# Patient Record
Sex: Female | Born: 1935 | Race: White | Hispanic: No | Marital: Married | Smoking: Never smoker
Health system: Southern US, Community
[De-identification: ages and names within clinical notes are randomized; demographics above are authoritative.]

## PROBLEM LIST (undated history)

## (undated) DIAGNOSIS — I4891 Unspecified atrial fibrillation: Secondary | ICD-10-CM

## (undated) DIAGNOSIS — I509 Heart failure, unspecified: Secondary | ICD-10-CM

## (undated) DIAGNOSIS — I350 Nonrheumatic aortic (valve) stenosis: Secondary | ICD-10-CM

## (undated) DIAGNOSIS — J45909 Unspecified asthma, uncomplicated: Secondary | ICD-10-CM

## (undated) DIAGNOSIS — G4730 Sleep apnea, unspecified: Secondary | ICD-10-CM

## (undated) DIAGNOSIS — D649 Anemia, unspecified: Secondary | ICD-10-CM

## (undated) DIAGNOSIS — I351 Nonrheumatic aortic (valve) insufficiency: Secondary | ICD-10-CM

## (undated) DIAGNOSIS — M1990 Unspecified osteoarthritis, unspecified site: Secondary | ICD-10-CM

## (undated) DIAGNOSIS — E1165 Type 2 diabetes mellitus with hyperglycemia: Secondary | ICD-10-CM

## (undated) DIAGNOSIS — M1A9XX0 Chronic gout, unspecified, without tophus (tophi): Secondary | ICD-10-CM

## (undated) DIAGNOSIS — E785 Hyperlipidemia, unspecified: Secondary | ICD-10-CM

## (undated) DIAGNOSIS — I10 Essential (primary) hypertension: Secondary | ICD-10-CM

## (undated) DIAGNOSIS — J449 Chronic obstructive pulmonary disease, unspecified: Secondary | ICD-10-CM

## (undated) DIAGNOSIS — IMO0002 Reserved for concepts with insufficient information to code with codable children: Secondary | ICD-10-CM

## (undated) DIAGNOSIS — M4302 Spondylolysis, cervical region: Secondary | ICD-10-CM

## (undated) DIAGNOSIS — M5030 Other cervical disc degeneration, unspecified cervical region: Secondary | ICD-10-CM

## (undated) DIAGNOSIS — I059 Rheumatic mitral valve disease, unspecified: Secondary | ICD-10-CM

## (undated) DIAGNOSIS — Z86718 Personal history of other venous thrombosis and embolism: Secondary | ICD-10-CM

## (undated) DIAGNOSIS — G2581 Restless legs syndrome: Secondary | ICD-10-CM

## (undated) DIAGNOSIS — F419 Anxiety disorder, unspecified: Secondary | ICD-10-CM

## (undated) HISTORY — PX: ABDOMINAL HYSTERECTOMY: SHX81

## (undated) HISTORY — DX: Unspecified osteoarthritis, unspecified site: M19.90

## (undated) HISTORY — DX: Unspecified asthma, uncomplicated: J45.909

## (undated) HISTORY — DX: Personal history of other venous thrombosis and embolism: Z86.718

## (undated) HISTORY — PX: CARDIAC CATHETERIZATION: SHX172

## (undated) HISTORY — DX: Heart failure, unspecified: I50.9

## (undated) HISTORY — DX: Essential (primary) hypertension: I10

## (undated) HISTORY — DX: Unspecified atrial fibrillation: I48.91

## (undated) HISTORY — DX: Hyperlipidemia, unspecified: E78.5

## (undated) HISTORY — DX: Nonrheumatic aortic (valve) stenosis: I35.0

## (undated) HISTORY — DX: Sleep apnea, unspecified: G47.30

## (undated) HISTORY — DX: Nonrheumatic aortic (valve) insufficiency: I35.1

## (undated) HISTORY — DX: Type 2 diabetes mellitus with hyperglycemia: E11.65

## (undated) HISTORY — PX: COLONOSCOPY: SHX174

## (undated) HISTORY — DX: Reserved for concepts with insufficient information to code with codable children: IMO0002

---

## 2004-02-03 ENCOUNTER — Ambulatory Visit: Payer: Self-pay

## 2004-04-28 ENCOUNTER — Ambulatory Visit: Payer: Self-pay | Admitting: Unknown Physician Specialty

## 2005-05-02 ENCOUNTER — Inpatient Hospital Stay: Payer: Self-pay | Admitting: Internal Medicine

## 2005-07-06 ENCOUNTER — Ambulatory Visit: Payer: Self-pay | Admitting: Unknown Physician Specialty

## 2005-11-10 ENCOUNTER — Ambulatory Visit: Payer: Self-pay | Admitting: Unknown Physician Specialty

## 2007-02-01 ENCOUNTER — Ambulatory Visit: Payer: Self-pay | Admitting: Unknown Physician Specialty

## 2007-09-11 ENCOUNTER — Ambulatory Visit: Payer: Self-pay | Admitting: Unknown Physician Specialty

## 2008-02-04 ENCOUNTER — Ambulatory Visit: Payer: Self-pay | Admitting: Unknown Physician Specialty

## 2008-09-09 ENCOUNTER — Ambulatory Visit: Payer: Self-pay | Admitting: Unknown Physician Specialty

## 2008-09-16 ENCOUNTER — Ambulatory Visit: Payer: Self-pay | Admitting: Unknown Physician Specialty

## 2008-10-22 ENCOUNTER — Ambulatory Visit: Payer: Self-pay | Admitting: Surgery

## 2009-02-04 ENCOUNTER — Ambulatory Visit: Payer: Self-pay | Admitting: Unknown Physician Specialty

## 2009-02-05 ENCOUNTER — Ambulatory Visit: Payer: Self-pay | Admitting: Unknown Physician Specialty

## 2010-02-08 ENCOUNTER — Ambulatory Visit: Payer: Self-pay | Admitting: Unknown Physician Specialty

## 2011-05-10 ENCOUNTER — Ambulatory Visit: Payer: Self-pay | Admitting: Unknown Physician Specialty

## 2011-11-18 ENCOUNTER — Ambulatory Visit: Payer: Self-pay | Admitting: Unknown Physician Specialty

## 2011-12-11 ENCOUNTER — Ambulatory Visit: Payer: Self-pay | Admitting: Internal Medicine

## 2012-01-06 ENCOUNTER — Inpatient Hospital Stay: Payer: Self-pay | Admitting: Internal Medicine

## 2012-01-06 LAB — COMPREHENSIVE METABOLIC PANEL
Albumin: 3.7 g/dL (ref 3.4–5.0)
Anion Gap: 7 (ref 7–16)
BUN: 80 mg/dL — ABNORMAL HIGH (ref 7–18)
Bilirubin,Total: 0.5 mg/dL (ref 0.2–1.0)
Chloride: 104 mmol/L (ref 98–107)
Co2: 28 mmol/L (ref 21–32)
Creatinine: 2.05 mg/dL — ABNORMAL HIGH (ref 0.60–1.30)
EGFR (African American): 27 — ABNORMAL LOW
Glucose: 134 mg/dL — ABNORMAL HIGH (ref 65–99)
Osmolality: 304 (ref 275–301)
Potassium: 4.7 mmol/L (ref 3.5–5.1)
SGPT (ALT): 20 U/L (ref 12–78)
Total Protein: 7.5 g/dL (ref 6.4–8.2)

## 2012-01-06 LAB — PROTIME-INR
INR: 1.8
Prothrombin Time: 20.9 secs — ABNORMAL HIGH (ref 11.5–14.7)

## 2012-01-06 LAB — PRO B NATRIURETIC PEPTIDE: B-Type Natriuretic Peptide: 17254 pg/mL — ABNORMAL HIGH (ref 0–450)

## 2012-01-06 LAB — CBC WITH DIFFERENTIAL/PLATELET
Basophil %: 0.6 %
Eosinophil #: 0.2 10*3/uL (ref 0.0–0.7)
Eosinophil %: 2.2 %
HCT: 35.3 % (ref 35.0–47.0)
Lymphocyte #: 1 10*3/uL (ref 1.0–3.6)
MCHC: 31.7 g/dL — ABNORMAL LOW (ref 32.0–36.0)
MCV: 93 fL (ref 80–100)
Neutrophil #: 5.9 10*3/uL (ref 1.4–6.5)
RDW: 15.4 % — ABNORMAL HIGH (ref 11.5–14.5)

## 2012-01-06 LAB — DIGOXIN LEVEL: Digoxin: >5 ng/mL

## 2012-01-06 LAB — URINALYSIS, COMPLETE
Bacteria: NONE SEEN
Bilirubin,UR: NEGATIVE
Hyaline Cast: 3
Ketone: NEGATIVE
Nitrite: NEGATIVE
Ph: 5 (ref 4.5–8.0)
Specific Gravity: 1.011 (ref 1.003–1.030)
Squamous Epithelial: NONE SEEN

## 2012-01-06 LAB — CK TOTAL AND CKMB (NOT AT ARMC): CK-MB: 1.2 ng/mL (ref 0.5–3.6)

## 2012-01-06 LAB — APTT: Activated PTT: 69.6 secs — ABNORMAL HIGH (ref 23.6–35.9)

## 2012-01-07 LAB — BASIC METABOLIC PANEL
BUN: 74 mg/dL — ABNORMAL HIGH (ref 7–18)
Chloride: 107 mmol/L (ref 98–107)
EGFR (African American): 33 — ABNORMAL LOW
Osmolality: 304 (ref 275–301)
Potassium: 4.2 mmol/L (ref 3.5–5.1)
Sodium: 141 mmol/L (ref 136–145)

## 2012-01-07 LAB — TROPONIN I
Troponin-I: 0.06 ng/mL — ABNORMAL HIGH
Troponin-I: 0.05 ng/mL

## 2012-01-07 LAB — DIGOXIN LEVEL: Digoxin: 0.23 ng/mL

## 2012-01-08 DIAGNOSIS — I509 Heart failure, unspecified: Secondary | ICD-10-CM

## 2012-01-08 LAB — BASIC METABOLIC PANEL
Anion Gap: 3 — ABNORMAL LOW (ref 7–16)
BUN: 60 mg/dL — ABNORMAL HIGH (ref 7–18)
Calcium, Total: 8.8 mg/dL (ref 8.5–10.1)
Chloride: 109 mmol/L — ABNORMAL HIGH (ref 98–107)
Co2: 31 mmol/L (ref 21–32)
EGFR (African American): 39 — ABNORMAL LOW
EGFR (Non-African Amer.): 33 — ABNORMAL LOW
Glucose: 152 mg/dL — ABNORMAL HIGH (ref 65–99)
Osmolality: 305 (ref 275–301)
Potassium: 4.8 mmol/L (ref 3.5–5.1)

## 2012-01-08 LAB — URINE CULTURE

## 2012-01-09 LAB — COMPREHENSIVE METABOLIC PANEL
BUN: 73 mg/dL — ABNORMAL HIGH (ref 7–18)
Bilirubin,Total: 0.3 mg/dL (ref 0.2–1.0)
Calcium, Total: 8.6 mg/dL (ref 8.5–10.1)
Chloride: 106 mmol/L (ref 98–107)
EGFR (African American): 29 — ABNORMAL LOW
EGFR (Non-African Amer.): 25 — ABNORMAL LOW
Potassium: 5.4 mmol/L — ABNORMAL HIGH (ref 3.5–5.1)
SGOT(AST): 35 U/L (ref 15–37)
Sodium: 141 mmol/L (ref 136–145)
Total Protein: 6.3 g/dL — ABNORMAL LOW (ref 6.4–8.2)

## 2012-01-09 LAB — CBC WITH DIFFERENTIAL/PLATELET
Basophil #: 0 10*3/uL (ref 0.0–0.1)
Basophil %: 0.1 %
Eosinophil #: 0 10*3/uL (ref 0.0–0.7)
Eosinophil %: 0 %
HCT: 31.5 % — ABNORMAL LOW (ref 35.0–47.0)
Lymphocyte #: 0.3 10*3/uL — ABNORMAL LOW (ref 1.0–3.6)
Lymphocyte %: 6.4 %
MCH: 30 pg (ref 26.0–34.0)
MCV: 94 fL (ref 80–100)
Monocyte #: 0.1 x10 3/mm — ABNORMAL LOW (ref 0.2–0.9)
Monocyte %: 1.4 %
Neutrophil #: 4.1 10*3/uL (ref 1.4–6.5)
Platelet: 97 10*3/uL — ABNORMAL LOW (ref 150–440)
RBC: 3.34 10*6/uL — ABNORMAL LOW (ref 3.80–5.20)
WBC: 4.5 10*3/uL (ref 3.6–11.0)

## 2012-01-09 LAB — POTASSIUM: Potassium: 5 mmol/L (ref 3.5–5.1)

## 2012-01-10 LAB — BASIC METABOLIC PANEL
Anion Gap: 6 — ABNORMAL LOW (ref 7–16)
BUN: 87 mg/dL — ABNORMAL HIGH (ref 7–18)
Calcium, Total: 8.2 mg/dL — ABNORMAL LOW (ref 8.5–10.1)
Chloride: 102 mmol/L (ref 98–107)
Co2: 30 mmol/L (ref 21–32)
EGFR (Non-African Amer.): 20 — ABNORMAL LOW
Osmolality: 311 (ref 275–301)
Potassium: 4.4 mmol/L (ref 3.5–5.1)

## 2012-01-10 LAB — CBC WITH DIFFERENTIAL/PLATELET
Basophil #: 0 10*3/uL (ref 0.0–0.1)
Basophil %: 0 %
Eosinophil #: 0 10*3/uL (ref 0.0–0.7)
HCT: 30.7 % — ABNORMAL LOW (ref 35.0–47.0)
Lymphocyte #: 0.3 10*3/uL — ABNORMAL LOW (ref 1.0–3.6)
MCH: 30.4 pg (ref 26.0–34.0)
MCV: 92 fL (ref 80–100)
Monocyte #: 0.2 x10 3/mm (ref 0.2–0.9)
Platelet: 102 10*3/uL — ABNORMAL LOW (ref 150–440)
RBC: 3.33 10*6/uL — ABNORMAL LOW (ref 3.80–5.20)
RDW: 15.3 % — ABNORMAL HIGH (ref 11.5–14.5)
WBC: 9 10*3/uL (ref 3.6–11.0)

## 2012-01-11 LAB — BASIC METABOLIC PANEL
Anion Gap: 4 — ABNORMAL LOW (ref 7–16)
Calcium, Total: 8.9 mg/dL (ref 8.5–10.1)
Creatinine: 1.79 mg/dL — ABNORMAL HIGH (ref 0.60–1.30)
EGFR (African American): 31 — ABNORMAL LOW
EGFR (Non-African Amer.): 27 — ABNORMAL LOW
Glucose: 246 mg/dL — ABNORMAL HIGH (ref 65–99)
Osmolality: 315 (ref 275–301)
Potassium: 3.4 mmol/L — ABNORMAL LOW (ref 3.5–5.1)
Sodium: 142 mmol/L (ref 136–145)

## 2012-01-12 LAB — BASIC METABOLIC PANEL
Anion Gap: 6 — ABNORMAL LOW (ref 7–16)
BUN: 74 mg/dL — ABNORMAL HIGH (ref 7–18)
Calcium, Total: 9.3 mg/dL (ref 8.5–10.1)
Chloride: 104 mmol/L (ref 98–107)
Creatinine: 1.58 mg/dL — ABNORMAL HIGH (ref 0.60–1.30)
Glucose: 182 mg/dL — ABNORMAL HIGH (ref 65–99)
Osmolality: 317 (ref 275–301)
Potassium: 4.2 mmol/L (ref 3.5–5.1)

## 2012-01-13 LAB — BASIC METABOLIC PANEL
BUN: 65 mg/dL — ABNORMAL HIGH (ref 7–18)
Chloride: 101 mmol/L (ref 98–107)
Creatinine: 1.33 mg/dL — ABNORMAL HIGH (ref 0.60–1.30)
EGFR (Non-African Amer.): 39 — ABNORMAL LOW
Glucose: 189 mg/dL — ABNORMAL HIGH (ref 65–99)
Potassium: 4.5 mmol/L (ref 3.5–5.1)

## 2012-01-15 ENCOUNTER — Other Ambulatory Visit: Payer: Self-pay | Admitting: Family Medicine

## 2012-01-15 LAB — PROTIME-INR
INR: 1.3
Prothrombin Time: 16.3 secs — ABNORMAL HIGH (ref 11.5–14.7)

## 2012-02-08 DIAGNOSIS — K8020 Calculus of gallbladder without cholecystitis without obstruction: Secondary | ICD-10-CM | POA: Insufficient documentation

## 2012-02-08 DIAGNOSIS — I059 Rheumatic mitral valve disease, unspecified: Secondary | ICD-10-CM | POA: Insufficient documentation

## 2012-02-08 DIAGNOSIS — D649 Anemia, unspecified: Secondary | ICD-10-CM | POA: Insufficient documentation

## 2012-02-08 DIAGNOSIS — N3020 Other chronic cystitis without hematuria: Secondary | ICD-10-CM | POA: Insufficient documentation

## 2012-02-08 DIAGNOSIS — I359 Nonrheumatic aortic valve disorder, unspecified: Secondary | ICD-10-CM | POA: Insufficient documentation

## 2012-02-08 DIAGNOSIS — I10 Essential (primary) hypertension: Secondary | ICD-10-CM | POA: Insufficient documentation

## 2012-02-08 DIAGNOSIS — N200 Calculus of kidney: Secondary | ICD-10-CM | POA: Insufficient documentation

## 2012-02-08 DIAGNOSIS — R339 Retention of urine, unspecified: Secondary | ICD-10-CM | POA: Insufficient documentation

## 2012-02-08 DIAGNOSIS — E119 Type 2 diabetes mellitus without complications: Secondary | ICD-10-CM | POA: Insufficient documentation

## 2012-02-08 DIAGNOSIS — N3946 Mixed incontinence: Secondary | ICD-10-CM | POA: Insufficient documentation

## 2012-02-08 DIAGNOSIS — B350 Tinea barbae and tinea capitis: Secondary | ICD-10-CM | POA: Insufficient documentation

## 2012-04-03 ENCOUNTER — Ambulatory Visit: Payer: Self-pay | Admitting: Cardiology

## 2012-04-03 LAB — PROTIME-INR: INR: 1.1

## 2012-04-26 DIAGNOSIS — I509 Heart failure, unspecified: Secondary | ICD-10-CM | POA: Insufficient documentation

## 2012-06-12 DIAGNOSIS — Z952 Presence of prosthetic heart valve: Secondary | ICD-10-CM | POA: Insufficient documentation

## 2012-06-12 DIAGNOSIS — R0689 Other abnormalities of breathing: Secondary | ICD-10-CM | POA: Insufficient documentation

## 2012-09-19 ENCOUNTER — Ambulatory Visit: Payer: Self-pay | Admitting: Physician Assistant

## 2012-11-13 ENCOUNTER — Ambulatory Visit: Payer: Self-pay | Admitting: Physician Assistant

## 2012-11-20 DIAGNOSIS — N281 Cyst of kidney, acquired: Secondary | ICD-10-CM | POA: Insufficient documentation

## 2013-02-12 ENCOUNTER — Ambulatory Visit: Payer: Self-pay | Admitting: Physician Assistant

## 2013-03-31 ENCOUNTER — Emergency Department: Payer: Self-pay | Admitting: Internal Medicine

## 2013-03-31 LAB — CBC
HCT: 32.4 % — ABNORMAL LOW (ref 35.0–47.0)
HGB: 10.7 g/dL — ABNORMAL LOW (ref 12.0–16.0)
MCH: 30.8 pg (ref 26.0–34.0)
MCHC: 33 g/dL (ref 32.0–36.0)
MCV: 93 fL (ref 80–100)
Platelet: 133 10*3/uL — ABNORMAL LOW (ref 150–440)
RBC: 3.46 10*6/uL — ABNORMAL LOW (ref 3.80–5.20)
RDW: 15.6 % — ABNORMAL HIGH (ref 11.5–14.5)
WBC: 9.8 10*3/uL (ref 3.6–11.0)

## 2013-03-31 LAB — BASIC METABOLIC PANEL
Anion Gap: 2 — ABNORMAL LOW (ref 7–16)
BUN: 26 mg/dL — ABNORMAL HIGH (ref 7–18)
CREATININE: 1.62 mg/dL — AB (ref 0.60–1.30)
Calcium, Total: 9.4 mg/dL (ref 8.5–10.1)
Chloride: 101 mmol/L (ref 98–107)
Co2: 32 mmol/L (ref 21–32)
EGFR (African American): 35 — ABNORMAL LOW
EGFR (Non-African Amer.): 30 — ABNORMAL LOW
Glucose: 180 mg/dL — ABNORMAL HIGH (ref 65–99)
Osmolality: 279 (ref 275–301)
Potassium: 4 mmol/L (ref 3.5–5.1)
SODIUM: 135 mmol/L — AB (ref 136–145)

## 2013-03-31 LAB — TROPONIN I

## 2013-04-08 ENCOUNTER — Other Ambulatory Visit: Payer: Self-pay | Admitting: Rheumatology

## 2013-04-13 LAB — CULTURE, BLOOD (SINGLE)

## 2013-04-23 DIAGNOSIS — M1990 Unspecified osteoarthritis, unspecified site: Secondary | ICD-10-CM | POA: Insufficient documentation

## 2013-05-07 DIAGNOSIS — Z9889 Other specified postprocedural states: Secondary | ICD-10-CM | POA: Insufficient documentation

## 2013-05-23 DIAGNOSIS — G629 Polyneuropathy, unspecified: Secondary | ICD-10-CM | POA: Insufficient documentation

## 2013-06-12 ENCOUNTER — Ambulatory Visit: Payer: Self-pay | Admitting: Urology

## 2013-06-12 DIAGNOSIS — N23 Unspecified renal colic: Secondary | ICD-10-CM | POA: Insufficient documentation

## 2013-06-23 ENCOUNTER — Emergency Department: Payer: Self-pay | Admitting: Emergency Medicine

## 2013-06-28 DIAGNOSIS — M5030 Other cervical disc degeneration, unspecified cervical region: Secondary | ICD-10-CM | POA: Insufficient documentation

## 2013-06-28 DIAGNOSIS — M47812 Spondylosis without myelopathy or radiculopathy, cervical region: Secondary | ICD-10-CM | POA: Insufficient documentation

## 2013-06-28 DIAGNOSIS — M62838 Other muscle spasm: Secondary | ICD-10-CM | POA: Insufficient documentation

## 2013-07-15 DIAGNOSIS — M5481 Occipital neuralgia: Secondary | ICD-10-CM | POA: Insufficient documentation

## 2014-04-29 NOTE — H&P (Signed)
PATIENT NAME:  Mindy Cook, Mindy Cook MR#:  [id number] DATE OF BIRTH:  16-Jul-1933  DATE OF ADMISSION:  01/06/2012  PRIMARY CARE PHYSICIAN: Dr. Silver Huguenin.   CHIEF COMPLAINT: Weakness and dizziness.   HISTORY OF PRESENT ILLNESS: This is a 81 year old female who presents to the Emergency Room due to weakness and dizziness ongoing since the past 3 to 4 days. The patient says that she has felt lightheaded and dizzy when she wakes up when she walks around. She denies any chest pain. She denies any palpitations. She denies any true syncope. She went over to the [facility] today and they noted her heart rate be significantly low in the 30s, and she was sent over to the ER for further evaluation. The patient apparently has a history of chronic atrial fibrillation and had received a Holter monitor from Dr. Windell Norfolk last week. The results of the Holter just showed atrial fibrillation with some PVCs but no evidence of any acute bradycardia. Although when she presented to the Emergency Room, her heart rate was in 30s. She was also noted to have a significantly elevated digoxin level of greater than 5, and noted to be acute renal failure. Hospitalist services were contacted for further treatment and evaluation.   REVIEW OF SYSTEMS: CONSTITUTIONAL: No documented fever. Positive weakness. No weight gain or weight loss.  EYES: No blurred or double vision.  ENT: No tinnitus. No postnasal drip. No redness of the oropharynx.  RESPIRATORY: No cough, no wheeze, no hemoptysis, no dyspnea.  CARDIOVASCULAR: No chest pain, no orthopnea, no palpitations, no true syncope.  GASTROINTESTINAL: No nausea, no vomiting, no diarrhea, no abdominal pain, no melena or hematochezia.  GENITOURINARY: No dysuria, no hematuria.  ENDOCRINE: No polyuria or nocturia. No heat or cold intolerance.  HEMATOLOGICAL: No anemia. No bruising. No bleeding.  INTEGUMENTARY: No rashes. No lesions.  MUSCULOSKELETAL: No arthritis, no swelling, no gout.   NEUROLOGIC: No numbness, no tingling, no ataxia, no seizure-type activity.  PSYCH: No anxiety, no insomnia, no ADD.   PAST MEDICAL HISTORY: Consistent with chronic atrial fibrillation, hypertension, obstructive sleep apnea, carpal tunnel syndrome, hyperlipidemia, a postoperative deep vein thrombosis, shingles, varicose veins, aortic stenosis.   PAST MEDICAL HISTORY: Consistent with complete hysterectomy, tonsillectomy, appendectomy.   ALLERGIES: HYDROCODONE WHICH CAUSES HIVES.   SOCIAL HISTORY: No smoking. No alcohol abuse. No illicit drug abuse. Lives at home with her daughter.   FAMILY HISTORY: Father died from complications of a cancer on his back. Mother died from complications of heart disease.   CURRENT MEDICATIONS: Are as follows Cefuroxime 250 mg b.i.d., digoxin 0.25 mg daily, iron sulfate 325 mg daily, Lasix 20 mg daily, gabapentin 300 mg 2 caps t.i.d., hydrochlorothiazide/valsartan 12.5/160, 1 tab daily, meclizine 12.5 mg t.i.d. as needed, Oxybutynin 5 mg daily, Pradaxa 150 mg b.i.d. and Pravachol 40 mg daily.   PHYSICAL EXAMINATION ON ADMISSION: Are as follows VITAL SIGNS: Temperature is 97.6, pulse 60, respirations 23, blood pressure 120/54, saturations 97% on room air.  GENERAL: She is a pleasant appearing female in no apparent distress.  HEENT: Atraumatic, normocephalic. Extraocular muscles are intact. Pupils are equal and reactive to light. Sclerae anicteric. No conjunctival injection. No pharyngeal erythema.  NECK: Supple. No jugular venous distention. No bruits, no lymphadenopathy, no thyromegaly. HEART: Irregular, bradycardic. She does have a 2/6 diastolic murmur heard in the apex. No rubs, no clicks.  LUNGS: Clear to auscultation bilaterally. No rales, no rhonchi, no wheezes.  ABDOMEN: Soft, flat, nontender, nondistended. Has good bowel sounds. No hepatosplenomegaly  appreciated.  EXTREMITIES: No evidence of any cyanosis or clubbing. She does have +1 to 2 pitting edema from  the knees to the ankles bilaterally. She has skin changes consistent with peripheral vascular disease.  NEUROLOGIC: She is alert, awake, and oriented x 3 with no focal motor or sensory deficits bilaterally.  SKIN: Moist and warm with no rashes.  LYMPHATIC: There is no cervical or axillary lymphadenopathy.   LABORATORY, DIAGNOSTIC, AND RADIOLOGICAL DATA: Serum glucose of 134, BUN 80, creatinine 2.05, sodium 139, potassium 4.7, chloride 104, bicarb 28. LFTs within normal limits. Digoxin level greater than 5. White cell count 7.9, hemoglobin 11.2, hematocrit 35.3, platelet count 122, INR is 1.8. Urinalysis shows 2+ leukocyte esterase and 49 white cells.   ASSESSMENT AND PLAN: This is an 81 year old female with a history of hypertension, chronic atrial fibrillation, neuropathy, urinary incontinence, a history of recurrent urinary tract infections, hyperlipidemia, who presents to the hospital with dizziness and weakness and noted to be in acute renal failure. Also noted to be bradycardic secondary to dig toxicity.   PROBLEM LIST: 1. Acute renal failure. This is likely secondary to dehydration and poor p.o. intake along with concomitant use of diuretics. I will hold her hydrochlorothiazide and valsartan for now. I will also hold her Lasix and gently hydrate her with IV fluids. Follow up BUN, creatinine, and urine output. Renal dose medications, avoid nephrotoxins. 2. Bradycardia with digoxin toxicity. This is likely secondary to the acute renal failure leading to her digoxin toxicity leading to her bradycardia. She is currently symptomatic with her bradycardia. She is receiving one dose of Digibind now. I will repeat her levels in the morning. We will get a Cardiology consult. The patient is well known to Dr. Windell NorfolkParraschos. Hold digoxin for now. 3. Chronic atrial fibrillation. As mentioned the patient is currently bradycardic, therefore digoxin is on hold. I will continue her Pradaxa but renally dose it given her  renal failure for now. 4. A history of recurrent urinary tract infections. Her urinalysis is currently grossly abnormal therefore I will continue her Ceftin for now and follow up her urine cultures. 5. Hyperlipidemia. Continue Pravachol. 6. Neuropathy. Continue gabapentin. 7. Urinary incontinence. Continue with Oxybutrin.  CODE STATUS:  FULL CODE.   TIME SPENT ON ADMISSION: 50 minutes.  ____________________________ Rolly PancakeVivek J. Cherlynn KaiserSainani, MD vjs:jm D: 01/06/2012 21:52:47 ET T: 01/07/2012 11:03:44 ET JOB#: [id number]  cc: Rolly PancakeVivek J. Cherlynn KaiserSainani, MD, <Dictator> Houston SirenVIVEK J SAINANI MD ELECTRONICALLY SIGNED 01/09/2012 8:21

## 2014-04-29 NOTE — Consult Note (Signed)
    Comments   Had a lengthy family meeting with pt's husband, son, daughter, and daughter-in-law. Family updated. They all seem to understand that patient may do poorly long-term and it is unknown if patient will recover to her previous baseline. Note that she is making improvement and was able to walk today with PT although she continues to have exertional shortness of breath and hypoxia. Discussed options of STR vs home health vs home hospice. Family agrees with whatever patient wants including hospice if that is her choice. We also talked about code status extensively. Husband says that patient always has said "dont place me on machines". I spoke with pt with her family present. We again discussed disposition and pt initially only wanted to return home but she feels in her weakened state that this may be a burden to her family and is opting instead to pursue STR. Family aware that pt would most likely qualify for hospice care after rehab if still appropriate and referral could be made at that time.  with patient about code status. Pt confirms DNR and family agrees. Will change code status to reflect this.   Time: 45 minutes  Electronic Signatures: Karlea Mckibbin, Daryl EasternJoshua R (NP)  (Signed 31-Dec-13 15:50)  Authored: Palliative Care   Last Updated: 31-Dec-13 15:50 by Malachy MoanBorders, Oluwatoni Rotunno R (NP)

## 2014-05-02 NOTE — Discharge Summary (Signed)
PATIENT NAME:  Mindy Cook, Mindy Cook MR#:  [id number] DATE OF BIRTH:  [birth year]  DATE OF ADMISSION:  01/06/2012 DATE OF DISCHARGE:  01/13/2012  ADMITTING DIAGNOSIS: Congestive heart failure.  DISCHARGE DIAGNOSES: 1. Acute respiratory failure due to congestive heart failure, acute on chronic diastolic, valvular. Severe aortic stenosis, moderate tricuspid regurgitation and mild to moderate mitral regurgitation was noted on echocardiogram done on this admission.  2. Atrial fibrillation, slow ventricular response, due to digoxin toxicity, resolved.  3. Acute on chronic renal failure.  4. Questionable right-sided pneumonia. 5. Right upper quadrant abdominal pain of unclear etiology at this time. Ultrasound of right upper quadrant was unremarkable with just gallstones noted but no cholecystitis or obstruction was noted.  6. Ventricular tachycardia episodes, resolved.  7. History of chronic atrial fibrillation, on chronic anticoagulation, being loaded with Coumadin therapy. Pradaxa was stopped due to renal failure.  8. Generalized weakness.  9. Dyspnea due to congestive heart failure as well as aortic stenosis. 10. Deconditioning.  11. Gallstones. No cholecystitis or obstruction.  12. History of hypertension, hyperlipidemia, obstructive sleep apnea, deep venous thrombosis and varicose veins.   DISCHARGE CONDITION: Stable.   DISCHARGE MEDICATIONS: The patient is to continue the following. 1. Gabapentin 300 mg p.o. two capsules 3 times daily.  2. Pravastatin 40 mg p.o. at bedtime.  3. Oxybutynin 5 mg p.o. daily.  4. Meclizine 12.5 mg p.o. 3 times daily as needed.  5. Iron sulfate 325 mg p.o. once daily.  6. Prednisone 50 mg p.o. once on 01/14/2012 then taper by 10 mg daily until stopped.  7. Warfarin 5 mg p.o. daily for 3 days. Get pro time/INR in 2 days after discharge to check the patient's pro time/INR with loading warfarin doses.  8. Coreg 3.125 mg p.o. twice daily.  9. Hydrocortisone 25 mg  rectal suppository every 6 hours as needed.  10. Furosemide 40 mg p.o. daily.  11. Codeine with guaifenesin 10/100 mg in 5 mL oral syrup, 10 mL every 4 hours as needed for cough.   NOTE: The patient is not to take digoxin, hydrochlorothiazide/losartan, Pradaxa or cefuroxime unless recommended by primary care physician.   HOME OXYGEN: Portable tank at 2 liters of oxygen through nasal cannula.   DIET: 2 grams salt, low fat, low cholesterol, mechanical soft.   ACTIVITY LIMITATIONS: As tolerated.   REFERRALS: Physical therapy 2 to 7 times a week.    DISCHARGE FOLLOWUP: Appointment with Dr. Silver Huguenin in 2 days after discharge.   CONSULTANTS:  1. Marcina Millard, MD. 2. Care Management - Laurette Schimke, NP. 3. Ned Grace, MD. 4. Arnoldo Hooker, MD.   RADIOLOGIC STUDIES: Chest portable single view on 01/06/2012 showed congestive heart failure and possible right lower lobe pneumonia was also noted.   Chest portable single view on 01/07/2012 revealed persistent changes  with congestive heart failure. No improvement since December 27th. Right lower lobe pneumonia cannot be excluded, according to the radiologist.   Repeated chest x-ray, PA and lateral, on 01/08/2012, revealed increased consolidated density within the right hemithorax component which may represent pleural effusion and interstitial infiltrate likely representing pulmonary edema. Underlying infectious or inflammatory etiologies cannot be excluded. Surveillance evaluation was recommended.   Portable single view chest x-ray, on 01/09/2012, revealed interval decrease in bilateral heterogeneous opacities and a right pleural effusion was noted.  Portable single view chest x-ray, on 01/12/2012, showed continued slow improvement in decreasing bilateral pulmonary opacities. There is persistent severe cardiomegaly and pulmonary vascular prominence in the hilar regions. Possible trace  pleural effusions were noted.  Chest, PA and  lateral, on 01/13/2012, showed persistent cardiomegaly and pulmonary vascular congestion. Findings are concerning for congestive heart failure, according to the radiologist.   Ultrasound of abdomen, limited survey, on 01/13/2012, showed cholelithiasis. No findings of acute cholecystitis. No biliary ductal dilatation. Multiple small mobile stones are seen within the gallbladder.   HISTORY OF PRESENT ILLNESS: The patient is a 81 year old Caucasian female with past medical history significant for history of severe aortic stenosis who presented to the hospital with complaints of weakness as well as dizziness. Please refer to Dr. Becky Sax admission note on 01/06/2012.   On arrival to the hospital, the patient was noted to be bradycardic with heart rate in the 30s. She was in a-fib. Her vital signs, temperature was 97.6, pulse was 30s to 60s, respiration rate was 23, blood pressure 120/54 and saturation was 97% on oxygen therapy. The patient had a systolic murmur heard at aortic auscultation site as well as all precordium.   The patient's digoxin level was more than 5. Otherwise, the patient's data revealed elevated B-type natriuretic peptide of 17,254. Glucose was 134 and BUN and creatinine were 80 and 2.05, otherwise BMP was unremarkable. Magnesium level was elevated at 3.2. Liver enzymes were normal. Cardiac enzymes, first set, revealed elevated troponin of 0.06, the second set 0.05 and third set 0.09. Digoxin level, as mentioned above, was more than 5 initially. White blood cell count was normal at 7.9, hemoglobin was 11.2 and platelet count 122. Absolute neutrophil count was within normal limits.  Pro time was elevated at 20.9, INR was 1.8 and activated PTT was 69.6. Blood cultures x 2 did not show any growth. Urine cultures also showed no growth. Urinalysis revealed pyuria with 49 white blood cells, 4 red blood cells and 2+ leukocyte esterase. EKG showed undetermined rhythm initially at 65 beats per  minute, right bundle branch block, inferior infarct which was already decided before December 2013, as well as possible anterolateral infarct, which was also old. Nonspecific ST-T changes were noted. The patient's chest x-ray was concerning for congestive heart failure as well as possible pneumonia.   HOSPITAL COURSE: The patient was admitted to the hospital. She was started on diuretics as well as initially on antibiotics. With this conservative therapy, she improved. She was consulted by cardiologist and followed by palliative care. The patient did progressively better with diuretics and her oxygenation improved. On the day of discharge, 01/13/2012, the patient's vital signs: Temperature 98.4, pulse was 64, respiration rate 20, blood pressure 134/69 and saturation was 93% to 96% on 2 liters of oxygen through nasal cannula.   In regards to acute respiratory failure, it was felt the patient's acute respiratory failure was very likely related to CHF, left heart, acute on chronic diastolic. The patient had an echocardiogram done while in the hospital, on 01/09/2012. It revealed normal left ventricular function, LVH and severe aortic stenosis as well as moderate TR and mild to moderate MR. The patient was diuresed and she is being discharged on oral Lasix. She is however recommended to have her medications advanced if her weight is going up.   In regards to atrial fibrillation as well as rapid ventricular response, initially the patient's rate limiting medications such as digoxin or any other medications were placed on hold. Later Coreg was restarted. The patient was given Digibind for digoxin toxicity and the patient's digoxin level normalized as well as her heart rate. On the day of discharge, the patient's heart  rate is ranging between 60s to 70s.  In regards to acute on chronic renal failure, the patient's kidney function improved with conservative management. Initially, as mentioned above, on the day of  admission, the patient's creatinine was 2.05. However, on the day of discharge, the patient's creatinine is 1.33. It is recommended to follow the patient's creatinine levels and make decisions about advancement in diuretic medications if needed, especially in view of chronic renal insufficiency.   In regards to questionable right-sided pneumonia, the patient received antibiotics while she was in the hospital. She completed her antibiotic course. Her most recent x-rays were not consistent with any pneumonia so no antibiotics were given to her upon discharge. Her white blood cell count also remained stable and she had no left shift and had no fevers while in the hospital.   Regarding right upper quadrant abdominal pain, it was unclear why the patient was having pain, however, it was felt that the patient's right upper quadrant abdominal pain probably was cough related. She had x-ray repeated which did not show any significant changes however some pleural effusions were noted which could have contributed to right upper quadrant abdominal pain. She also had ultrasound of her abdomen done which showed no obstruction or any other abnormalities, except of gallstones. However, no infection was noted. It is recommended to give the patient pain medications as needed for right upper quadrant, right lower chest area pains, which are likely pleuritic pains.  In regards to v-tach, the patient had a few episodes of 5 to 6 beats of v-tach, however, those resolved with reinstatement of Coreg.  In regards to chronic atrial fibrillation, as mentioned above, initially the patient was severely bradycardic with heart rate in the 30s. However, with holding her digoxin, the patient's heart rate rebound.  On the day of discharge, the patient's heart rate, even on low Coreg doses, is stable in 60s to 70s.   The patient's Pradaxa was placed on hold due to renal insufficiency and Coumadin was initiated. The patient Coumadin is being  advanced, however, the patient's INR is very low at 1.2, on the day of discharge, 01/13/2012. It is recommended to follow the patient's pro time/INR and make decisions about advancement in her Coumadin dose if needed to keep a target INR level of 2.0 and above.  In regards to generalized weakness, the patient received physical therapy and she was also evaluated by palliative care. However, because she continued to improve somewhat, the decision was made to discharge her to skilled nursing facility for now. The patient would benefit however from palliative care and even hospice care at home.   In regards to hyperlipidemia, the patient is to continue her outpatient medications.   For hypertension, as mentioned above, the patient is on Coreg now. It is recommended to reinstate her ACE inhibitor or ARB. Her blood pressure is well controlled at this time. The patient is being discharged in stable condition with the above-mentioned medications and follow-up.   TIME SPENT: 40 minutes.  ____________________________ Katharina Caperima Sameul Tagle, MD rv:sb D: 01/13/2012 15:52:23 ET    T: 01/13/2012 16:39:45 ET       JOB#: [id number] cc: Katharina Caperima Alejandra Barna, MD, <Dictator> Yetta FlockAileen H. Miller, MD Giordano Getman MD ELECTRONICALLY SIGNED 01/22/2012 14:17

## 2014-05-02 NOTE — Discharge Summary (Signed)
PATIENT NAME:  Mindy Cook, [name] MR#:  [id number] DATE OF BIRTH:  1933-04-15  DATE OF ADMISSION:  01/06/2012 DATE OF DISCHARGE:  01/13/2012  ADDENDUM:  The patient's medication list is being updated and Lasix will be increased to 40 mg p.o. twice daily dose instead of once daily dose. However, it is recommended to advance the patient's Lasix dose depending on her ins and outs as well as her weight. It is also recommended to resume her HCTZ/losartan dose as outpatient. We are not restarting those medications at this time because of mild renal insufficiency and we think that she may not do well and that her kidney function can worsen and fluid retention may worsen with losartan, unfortunately. However, this needs to be readdressed in the future.  ____________________________ Katharina Caperima Oluwatobiloba Martin, MD rv:sb D: 01/13/2012 15:58:35 ET T: 01/13/2012 16:07:33 ET JOB#: [id number]  cc: Katharina Caperima Rikayla Demmon, MD, <Dictator> Tatym Schermer MD ELECTRONICALLY SIGNED 01/22/2012 14:17

## 2014-05-02 NOTE — Consult Note (Signed)
PATIENT NAME:  Mindy Cook, [name] MR#:  [id number] DATE OF BIRTH:  1933-12-09  DATE OF CONSULTATION:  01/08/2012  REFERRING PHYSICIAN:  Dr. Cherlynn KaiserSainani.   CONSULTING PHYSICIAN:  Lamar BlinksBruce J. Kowalski, MD  REASON FOR CONSULTATION: Atrial fibrillation with sick sinus syndrome, hypertension, chronic obstructive pulmonary disease, aortic stenosis with bradycardia and digoxin toxicity.   CHIEF COMPLAINT: "I am weak."   HISTORY OF PRESENT ILLNESS: This is a 81 year old female with atrial fibrillation with sick sinus syndrome who has been on digoxin and other medications. Hypertension control has been quite good. She does have some mild COPD which is not exacerbated at this time. The patient does have aortic valve disease with murmur but no evidence of significant aortic stenosis. With admission to the hospital, the patient had some shortness of breath, weakness and inability to do the things she wishes. EKG had shown atrial fibrillation with preventricular contractions and significant bradycardia. It was found that she had dig toxicity and therefore was given Digibind. The patient also had chronic kidney disease with exacerbation of chronic kidney disease, which is likely causing some of the issues above. The patient has had reasonable blood pressure and heart rate control, not requiring further intervention at this time.   REVIEW OF SYSTEMS: The remainder review of systems negative for vision change, ringing in the ears, hearing loss, cough, congestion, heartburn, nausea, vomiting, diarrhea, bloody stools, stomach pain, extremity pain, leg weakness, cramping of the buttocks, known blood clots, headaches, blackouts, dizzy spells, nosebleeds, congestion, trouble swallowing, frequent urination, urination at night, muscle weakness, numbness, anxiety or depression, skin lesions or skin rashes.   PAST MEDICAL HISTORY: 1.  Hypertension.  2.  Hyperlipidemia.  3.  COPD.  4.  Aortic stenosis.   FAMILY HISTORY: Mother had  heart failure.   SOCIAL HISTORY: Currently denies alcohol or tobacco use.   ALLERGIES: No known drug allergies.   CURRENT MEDICATIONS: As listed.   PHYSICAL EXAMINATION: VITAL SIGNS: Blood pressure is 132/68 bilaterally, heart rate is 48 upright reclining and irregular.  GENERAL: She is a well-appearing elderly female in no acute distress.  HEENT: No icterus, thyromegaly, ulcers, hemorrhage or xanthelasma.  CARDIOVASCULAR: Irregularly irregular with normal S1, S2 with a 3 out of 6 right upper sternal border murmur nonradiating. PMI is diffuse. Carotid upstroke normal with murmur radiation. Jugular venous pressure is normal.  LUNGS: Have a few basilar crackles with normal respirations and few expiratory wheezes.  ABDOMEN: Soft, nontender, without hepatosplenomegaly or masses. Abdominal aorta is normal size without bruit.  EXTREMITIES: Show 2+ radial, femoral, dorsal pedal pulses with no lower extremity edema, cyanosis, clubbing or ulcers.  NEUROLOGIC: She is oriented to time, place and person with normal mood and affect.   ASSESSMENT: This is a 81 year old female with atrial fibrillation, hypertension, chronic obstructive pulmonary disease, aortic valve stenosis with significant bradycardia, weakness, fatigue, chronic kidney disease and digoxin toxicity needing further treatment options.   RECOMMENDATIONS: 1.  Digibind for treatment of dig toxicity and watch heart rate improve.  2.  Avoid beta blockers.  3.  Continue anticoagulation for further risk reduction and stroke with atrial fibrillation.  4.  Hypertension control with calcium channel blocker. Would avoid ACE inhibitor due to  chronic kidney disease.  5.  Echocardiogram for LV systolic dysfunction, valvular heart disease causing above.  6.  Ambulate and follow for improvements of symptoms and possible discharge to home when able.     ____________________________ Lamar BlinksBruce J. Kowalski, MD bjk:cs D: 01/08/2012 07:46:00  ET T: 01/08/2012  19:52:28 ET JOB#: [id number]  cc: Lamar Blinks, MD, <Dictator> Lamar Blinks MD ELECTRONICALLY SIGNED 01/26/2012 8:26

## 2014-05-02 NOTE — Discharge Summary (Signed)
PATIENT NAME:  Mindy Cook, Mindy Cook MR#:  [id number] DATE OF BIRTH:  05/10/1933  DATE OF ADMISSION:  01/06/2012 DATE OF DISCHARGE:  01/13/2012  DICTATION ENDS HERE   ____________________________ Katharina Caperima Akasia Ahmad, MD rv:es D: 01/16/2012 20:07:22 ET T: 01/17/2012 11:17:31 ET JOB#: [id number]  cc: Katharina Caperima Shriyan Arakawa, MD, <Dictator> Jeidy Hoerner MD ELECTRONICALLY SIGNED 01/22/2012 14:17

## 2014-05-20 ENCOUNTER — Other Ambulatory Visit: Payer: Self-pay | Admitting: Orthopedic Surgery

## 2014-05-20 DIAGNOSIS — M75102 Unspecified rotator cuff tear or rupture of left shoulder, not specified as traumatic: Secondary | ICD-10-CM

## 2014-05-26 ENCOUNTER — Ambulatory Visit: Payer: Self-pay

## 2014-06-03 ENCOUNTER — Ambulatory Visit
Admission: RE | Admit: 2014-06-03 | Discharge: 2014-06-03 | Disposition: A | Discharge status: Home / Self Care | Payer: Medicare Other | Source: Ambulatory Visit | Attending: Orthopedic Surgery | Admitting: Orthopedic Surgery

## 2014-06-03 DIAGNOSIS — M75102 Unspecified rotator cuff tear or rupture of left shoulder, not specified as traumatic: Secondary | ICD-10-CM

## 2014-06-03 DIAGNOSIS — M25512 Pain in left shoulder: Secondary | ICD-10-CM | POA: Diagnosis present

## 2014-06-03 DIAGNOSIS — M75122 Complete rotator cuff tear or rupture of left shoulder, not specified as traumatic: Secondary | ICD-10-CM | POA: Insufficient documentation

## 2014-06-03 DIAGNOSIS — M19012 Primary osteoarthritis, left shoulder: Secondary | ICD-10-CM | POA: Diagnosis not present

## 2014-07-06 DIAGNOSIS — M75122 Complete rotator cuff tear or rupture of left shoulder, not specified as traumatic: Secondary | ICD-10-CM | POA: Insufficient documentation

## 2014-07-21 ENCOUNTER — Encounter: Payer: Self-pay | Admitting: *Deleted

## 2014-07-21 DIAGNOSIS — E785 Hyperlipidemia, unspecified: Secondary | ICD-10-CM | POA: Insufficient documentation

## 2014-07-21 DIAGNOSIS — I4891 Unspecified atrial fibrillation: Secondary | ICD-10-CM | POA: Insufficient documentation

## 2014-07-21 DIAGNOSIS — Z86718 Personal history of other venous thrombosis and embolism: Secondary | ICD-10-CM | POA: Insufficient documentation

## 2014-07-21 DIAGNOSIS — J449 Chronic obstructive pulmonary disease, unspecified: Secondary | ICD-10-CM | POA: Insufficient documentation

## 2014-07-21 DIAGNOSIS — G4730 Sleep apnea, unspecified: Secondary | ICD-10-CM | POA: Insufficient documentation

## 2014-07-22 ENCOUNTER — Ambulatory Visit (INDEPENDENT_AMBULATORY_CARE_PROVIDER_SITE_OTHER): Payer: Medicare Other | Admitting: Urology

## 2014-07-22 ENCOUNTER — Encounter: Payer: Self-pay | Admitting: Urology

## 2014-07-22 VITALS — BP 132/71 | HR 90 | Resp 18 | Ht 63.0 in | Wt 189.0 lb

## 2014-07-22 DIAGNOSIS — N390 Urinary tract infection, site not specified: Secondary | ICD-10-CM | POA: Diagnosis not present

## 2014-07-22 LAB — URINALYSIS, COMPLETE
Bilirubin, UA: NEGATIVE
Glucose, UA: NEGATIVE
KETONES UA: NEGATIVE
Nitrite, UA: NEGATIVE
PH UA: 7 (ref 5.0–7.5)
PROTEIN UA: NEGATIVE
Specific Gravity, UA: 1.015 (ref 1.005–1.030)
Urobilinogen, Ur: 0.2 mg/dL (ref 0.2–1.0)

## 2014-07-22 LAB — MICROSCOPIC EXAMINATION

## 2014-07-22 LAB — BLADDER SCAN AMB NON-IMAGING: SCAN RESULT: 45

## 2014-07-22 MED ORDER — MIRABEGRON ER 50 MG PO TB24: 50.0000 mg | ORAL_TABLET | Freq: Every day | ORAL | Status: AC

## 2014-07-22 MED ORDER — NITROFURANTOIN MACROCRYSTAL 50 MG PO CAPS: 50.0000 mg | ORAL_CAPSULE | Freq: Every day | ORAL | Status: DC

## 2014-07-22 NOTE — Patient Instructions (Addendum)
Start Cranberry Tablets, one tablet twice daily Start Probiotic, Lactobacillus, daily

## 2014-07-22 NOTE — Progress Notes (Signed)
I have been asked to see the patient by Dr. Molinda BailiffMiriam McLoughlin, MD , for evaluation and management of recurrent UTIs.  History of present illness: 81 year old lady with significant comorbidities including advanced COPD requiring home O2 with significant immobility presents today for recurrent urinary tract infections. The patient states this is been going on for several years now. She has been seen and followed by Dr. Achilles Dunkope for overactive bladder, but is transitioning care and has not seen him for some time now. The patient has had over the past 7 or 8 months Escherichia coli infections that are beta lactamase resistant and with each culture her exterior is becoming more resistant. Her most recent urinary culture revealed only sensitivity to Macrobid. When the patient symptomatic she has dysuria with some worsening incontinence. Treating the infection her symptoms do resolve for a period of time prior to returning. The patient has baseline urinary incontinence and wears a depends throughout the day. She changes it once a day but does state that often she is wet and is unable to change her pad.  She denies stress urinary incontinence.  She denies any difficulty initiating her stream or emptying her bladder. She denies any signs or symptoms of prolapse. She has had a hysterectomy.  She denies constipation. She has no neurological comorbidities  The patient also states that she has a history of kidney stones, she was told she had stones sometime ago which she does not recall passing. She denies any flank pain at this point.  Review of systems: A 12 point comprehensive review of systems was obtained and is negative unless otherwise stated in the history of present illness.  Patient Active Problem List   Diagnosis Date Noted  . A-fib 07/21/2014  . CAFL (chronic airflow limitation) 07/21/2014  . H/O deep venous thrombosis 07/21/2014  . HLD (hyperlipidemia) 07/21/2014  . BP (high blood pressure) 07/21/2014  .  Apnea, sleep 07/21/2014  . Complete rotator cuff rupture of left shoulder 07/06/2014  . Cervico-occipital neuralgia 07/15/2013  . Cervical osteoarthritis 06/28/2013  . DDD (degenerative disc disease), cervical 06/28/2013  . Muscle spasms of neck 06/28/2013  . Renal colic 06/12/2013  . Neuropathy 05/23/2013  . History of open heart surgery 05/07/2013  . Arthritis 04/23/2013  . Acquired cyst of kidney 11/20/2012  . Hypercapnemia 06/12/2012  . H/O aortic valve replacement 06/12/2012  . Congestive heart failure 04/26/2012  . Heart failure 04/26/2012  . Absolute anemia 02/08/2012  . Aortic valve defect 02/08/2012  . Benign essential HTN 02/08/2012  . Bladder infection, chronic 02/08/2012  . Calculus of gallbladder 02/08/2012  . Calculus of kidney 02/08/2012  . Biliary calculi 02/08/2012  . Dermatophytosis of scalp and beard 02/08/2012  . Incomplete bladder emptying 02/08/2012  . Disorder of mitral valve 02/08/2012  . Mixed incontinence 02/08/2012  . Diabetes mellitus, type 2 02/08/2012    No current outpatient prescriptions on file prior to visit.   No current facility-administered medications on file prior to visit.    No past medical history on file.  No past surgical history on file.  History  Substance Use Topics  . Smoking status: Not on file  . Smokeless tobacco: Not on file  . Alcohol Use: Not on file    No family history on file.  PE: There were no vitals filed for this visit. Patient appears to be in no acute distress  patient is alert and oriented x3 Morbidly obese Atraumatic normocephalic head No cervical or supraclavicular lymphadenopathy appreciated Increased work of  breathing, on home O2, no audible wheezes/rhonchi Regular sinus rhythm/rate Abdomen is soft, nontender, nondistended, no CVA or suprapubic tenderness Lower extremities are symmetric with mild pedal edema Grossly neurologically intact No identifiable skin lesions  No results for  input(s): WBC, HGB, HCT in the last 72 hours. No results for input(s): NA, K, CL, CO2, GLUCOSE, BUN, CREATININE, CALCIUM in the last 72 hours. No results for input(s): LABPT, INR in the last 72 hours. No results for input(s): LABURIN in the last 72 hours. Results for orders placed or performed in visit on 04/08/13  Culture, blood (single)     Status: None   Collection Time: 04/08/13  2:36 PM  Result Value Ref Range Status   Micro Text Report   Final       COMMENT                   NO GROWTH AEROBICALLY/ANAEROBICALLY IN 5 DAYS   ANTIBIOTIC                                                      Culture, blood (single)     Status: None   Collection Time: 04/08/13  2:39 PM  Result Value Ref Range Status   Micro Text Report   Final       COMMENT                   NO GROWTH AEROBICALLY/ANAEROBICALLY IN 5 DAYS   ANTIBIOTIC                                                       PVR: 45 mL's Imaging: none  Imp: The patienthas recurrent urinary tract infections with extended beta lactamase resistant Escherichia coli. Currently she has only Macrobid as an oral option. She has a significant associated comorbidities including end-stage COPD, heart failure, immobility, and obesity. She also has urge incontinence making it difficult for her to get to the restroom in time and as such wears a depends diaper which remains moist throughout the day.  Recommendations: We discussed UTI prevention strategies. I recommended that the patient consider starting cranberry tablets twice daily. I also recommended that she consider taking a probiotic-I recommended lactobacillus-which is the most common vaginal flora. In addition, I think it is wise for her to start suppressive antibody daily at bedtime in the form of Macrobid. Hopefully, this will all allow her to restore her normal flora and prevent her from developing ongoing urinary tract infections. Further, I recommended that she consider myrbetriq 50 mg daily  which will help with her urge incontinence and should reduce the amount of leakage and thus wet diapers that she sits in all day. If the patient does not get better has breakthrough UTIs, she will need a CT scan to better clarify the stone situation in the upper tracts.   Mindy Cook

## 2014-07-23 ENCOUNTER — Encounter: Payer: Self-pay | Admitting: *Deleted

## 2014-07-24 LAB — CULTURE, URINE COMPREHENSIVE

## 2014-07-25 ENCOUNTER — Emergency Department
Admission: EM | Admit: 2014-07-25 | Discharge: 2014-07-25 | Disposition: A | Discharge status: Home / Self Care | Payer: Medicare Other | Attending: Emergency Medicine | Admitting: Emergency Medicine

## 2014-07-25 ENCOUNTER — Emergency Department: Payer: Medicare Other

## 2014-07-25 DIAGNOSIS — I10 Essential (primary) hypertension: Secondary | ICD-10-CM | POA: Insufficient documentation

## 2014-07-25 DIAGNOSIS — E119 Type 2 diabetes mellitus without complications: Secondary | ICD-10-CM | POA: Diagnosis not present

## 2014-07-25 DIAGNOSIS — L89151 Pressure ulcer of sacral region, stage 1: Secondary | ICD-10-CM | POA: Insufficient documentation

## 2014-07-25 DIAGNOSIS — Z79899 Other long term (current) drug therapy: Secondary | ICD-10-CM | POA: Diagnosis not present

## 2014-07-25 DIAGNOSIS — R109 Unspecified abdominal pain: Secondary | ICD-10-CM

## 2014-07-25 DIAGNOSIS — R1031 Right lower quadrant pain: Secondary | ICD-10-CM | POA: Diagnosis not present

## 2014-07-25 LAB — CBC WITH DIFFERENTIAL/PLATELET
Basophils Absolute: 0 10*3/uL (ref 0–0.1)
Basophils Relative: 0 %
Eosinophils Absolute: 0 10*3/uL (ref 0–0.7)
Eosinophils Relative: 0 %
HCT: 29.7 % — ABNORMAL LOW (ref 35.0–47.0)
Hemoglobin: 9.9 g/dL — ABNORMAL LOW (ref 12.0–16.0)
Lymphocytes Relative: 6 %
Lymphs Abs: 0.4 10*3/uL — ABNORMAL LOW (ref 1.0–3.6)
MCH: 30 pg (ref 26.0–34.0)
MCHC: 33.2 g/dL (ref 32.0–36.0)
MCV: 90.4 fL (ref 80.0–100.0)
MONO ABS: 0.1 10*3/uL — AB (ref 0.2–0.9)
MONOS PCT: 2 %
NEUTROS ABS: 7.2 10*3/uL — AB (ref 1.4–6.5)
Neutrophils Relative %: 92 %
PLATELETS: 153 10*3/uL (ref 150–440)
RBC: 3.29 MIL/uL — AB (ref 3.80–5.20)
RDW: 15.8 % — ABNORMAL HIGH (ref 11.5–14.5)
WBC: 7.7 10*3/uL (ref 3.6–11.0)

## 2014-07-25 LAB — PROTIME-INR
INR: 3.13
Prothrombin Time: 32.2 seconds — ABNORMAL HIGH (ref 11.4–15.0)

## 2014-07-25 LAB — URINALYSIS COMPLETE WITH MICROSCOPIC (ARMC ONLY)
BILIRUBIN URINE: NEGATIVE
Bacteria, UA: NONE SEEN
Glucose, UA: 150 mg/dL — AB
Ketones, ur: NEGATIVE mg/dL
Leukocytes, UA: NEGATIVE
Nitrite: NEGATIVE
PH: 8 (ref 5.0–8.0)
Protein, ur: NEGATIVE mg/dL
Specific Gravity, Urine: 1.012 (ref 1.005–1.030)

## 2014-07-25 LAB — BASIC METABOLIC PANEL
ANION GAP: 13 (ref 5–15)
BUN: 42 mg/dL — ABNORMAL HIGH (ref 6–20)
CALCIUM: 9.6 mg/dL (ref 8.9–10.3)
CO2: 39 mmol/L — ABNORMAL HIGH (ref 22–32)
CREATININE: 1.17 mg/dL — AB (ref 0.44–1.00)
Chloride: 85 mmol/L — ABNORMAL LOW (ref 101–111)
GFR calc non Af Amer: 43 mL/min — ABNORMAL LOW (ref >60)
GFR, EST AFRICAN AMERICAN: 50 mL/min — AB (ref >60)
GLUCOSE: 309 mg/dL — AB (ref 65–99)
Potassium: 3.5 mmol/L (ref 3.5–5.1)
SODIUM: 137 mmol/L (ref 135–145)

## 2014-07-25 MED ORDER — HYDROCODONE-ACETAMINOPHEN 5-325 MG PO TABS: 1.0000 | ORAL_TABLET | Freq: Four times a day (QID) | ORAL | Status: DC | PRN

## 2014-07-25 MED ORDER — ONDANSETRON HCL 4 MG PO TABS: 4.0000 mg | ORAL_TABLET | Freq: Three times a day (TID) | ORAL | Status: DC | PRN

## 2014-07-25 MED ORDER — ONDANSETRON 4 MG PO TBDP
ORAL_TABLET | ORAL | Status: AC
  Filled 2014-07-25: qty 1

## 2014-07-25 MED ORDER — ONDANSETRON 4 MG PO TBDP
4.0000 mg | ORAL_TABLET | Freq: Once | ORAL | Status: AC
  Administered 2014-07-25: 4 mg via ORAL
  Filled 2014-07-25: qty 1

## 2014-07-25 MED ORDER — HYDROCODONE-ACETAMINOPHEN 5-325 MG PO TABS
1.0000 | ORAL_TABLET | Freq: Once | ORAL | Status: AC
  Administered 2014-07-25: 1 via ORAL
  Filled 2014-07-25: qty 1

## 2014-07-25 MED ORDER — SODIUM CHLORIDE 0.9 % IV BOLUS (SEPSIS)
1000.0000 mL | Freq: Once | INTRAVENOUS | Status: AC
  Administered 2014-07-25: 1000 mL via INTRAVENOUS

## 2014-07-25 MED ORDER — HYDROCODONE-ACETAMINOPHEN 5-325 MG PO TABS
ORAL_TABLET | ORAL | Status: AC
  Filled 2014-07-25: qty 1

## 2014-07-25 NOTE — Discharge Instructions (Signed)
1. Finish antibiotics as prescribed by your doctor. 2. Recheck your Coumadin level with your doctor in 1 week. 3. Return to the ER for worsening symptoms, fever, persistent vomiting or other concerns.  Flank Pain Flank pain refers to pain that is located on the side of the body between the upper abdomen and the back. The pain may occur over a short period of time (acute) or may be long-term or reoccurring (chronic). It may be mild or severe. Flank pain can be caused by many things. CAUSES  Some of the more common causes of flank pain include:  Muscle strains.   Muscle spasms.   A disease of your spine (vertebral disk disease).   A lung infection (pneumonia).   Fluid around your lungs (pulmonary edema).   A kidney infection.   Kidney stones.   A very painful skin rash caused by the chickenpox virus (shingles).   Gallbladder disease.  HOME CARE INSTRUCTIONS  Home care will depend on the cause of your pain. In general,  Rest as directed by your caregiver.  Drink enough fluids to keep your urine clear or pale yellow.  Only take over-the-counter or prescription medicines as directed by your caregiver. Some medicines may help relieve the pain.  Tell your caregiver about any changes in your pain.  Follow up with your caregiver as directed. SEEK IMMEDIATE MEDICAL CARE IF:   Your pain is not controlled with medicine.   You have new or worsening symptoms.  Your pain increases.   You have abdominal pain.   You have shortness of breath.   You have persistent nausea or vomiting.   You have swelling in your abdomen.   You feel faint or pass out.   You have blood in your urine.  You have a fever or persistent symptoms for more than 2-3 days.  You have a fever and your symptoms suddenly get worse. MAKE SURE YOU:   Understand these instructions.  Will watch your condition.  Will get help right away if you are not doing well or get worse. Document  Released: 02/17/2005 Document Revised: 09/21/2011 Document Reviewed: 08/11/2011 [facility] Patient Information 2015 [place]. This information is not intended to replace advice given to you by your health care provider. Make sure you discuss any questions you have with your health care provider.  Pressure Ulcer A pressure ulcer is a sore that has formed from the breakdown of skin and exposure of deeper layers of tissue. It develops in areas of the body where there is unrelieved pressure. Pressure ulcers are usually found over a bony area, such as the shoulder blades, spine, lower back, hips, knees, ankles, and heels. Pressure ulcers vary in severity. Your health care provider may determine the severity (stage) of your pressure ulcer. The stages include:  Stage I--The skin is red, and when the skin is pressed, it stays red.  Stage II--The top layer of skin is gone, and there is a shallow, pink ulcer.  Stage III--The ulcer becomes deeper, and it is more difficult to see the whole wound. Also, there may be yellow or brown parts, as well as pink and red parts.  Stage IV--The ulcer may be deep and red, pink, brown, white, or yellow. Bone or muscle may be seen.  Unstageable pressure ulcer--The ulcer is covered almost completely with black, brown, or yellow tissue. It is not known how deep the ulcer is or what stage it is until this covering comes off.  Suspected deep tissue injury--A person's  skin can be injured from pressure or pulling on the skin when his or her position is changed. The skin appears purple or maroon. There may not be an opening in the skin, but there could be a blood-filled blister. This deep tissue injury is often difficult to see in people with darker skin tones. The site may open and become deeper in time. However, early interventions will help the area heal and may prevent the area from opening. CAUSES  Pressure ulcers are caused by pressure against the skin that limits the  flow of blood to the skin and nearby tissues. There are many risk factors that can lead to pressure sores. RISK FACTORS  Decreased ability to move.  Decreased ability to feel pain or discomfort.  Excessive skin moisture from urine, stool, sweat, or secretions.  Poor nutrition.  Dehydration.  Tobacco, drug, or alcohol abuse.  Having someone pull on bedsheets that are under you, such as when health care workers are changing your position in a hospital bed.  Obesity.  Increased adult age.  Hospitalization in a critical care unit for longer than 4 days with use of medical devices.  Prolonged use of medical devices.  Critical illness.  Anemia.  Traumatic brain injury.  Spinal cord injury.  Stroke.  Diabetes.  Poor blood glucose control.  Low blood pressure (hypotension).  Low oxygen levels.  Medicines that reduce blood flow.  Infection. DIAGNOSIS  Your health care provider will diagnose your pressure ulcer based on its appearance. The health care provider may determine the stage of your pressure ulcer as well. Tests may be done to check for infection, to assess your circulation, or to check for other diseases, such as diabetes. TREATMENT  Treatment of your pressure ulcer begins with determining what stage the ulcer is in. Your treatment team may include your health care provider, a wound care specialist, a nutritionist, a physical therapist, and a Careers adviser. Possible treatments may include:   Moving or repositioning every 1-2 hours.  Using beds or mattresses to shift your body weight and pressure points frequently.  Improving your diet.  Cleaning and bandaging (dressing) the open wound.  Giving antibiotic medicines.  Removing damaged tissue.  Surgery and sometimes skin grafts. HOME CARE INSTRUCTIONS  If you were hospitalized, follow the care plan that was started in the hospital.  Avoid staying in the same position for more than 2 hours. Use padding,  devices, or mattresses to cushion your pressure points as directed by your health care provider.  Eat a well-balanced diet. Take nutritional supplements and vitamins as directed by your health care provider.  Keep all follow-up appointments.  Only take over-the-counter or prescription medicines for pain, fever, or discomfort as directed by your health care provider. SEEK MEDICAL CARE IF:   Your pressure ulcer is not improving.  You do not know how to care for your pressure ulcer.  You notice other areas of redness on your skin.  You have a fever. SEEK IMMEDIATE MEDICAL CARE IF:   You have increasing redness, swelling, or pain in your pressure ulcer.  You notice pus coming from your pressure ulcer.  You notice a bad smell coming from the wound or dressing.  Your pressure ulcer opens up again. Document Released: 12/27/2004 Document Revised: 01/01/2013 Document Reviewed: 09/03/2012 [facility] Patient Information 2015 [place], [place]. This information is not intended to replace advice given to you by your health care provider. Make sure you discuss any questions you have with your health care provider.

## 2014-07-25 NOTE — ED Provider Notes (Signed)
[facility] Emergency Department Provider Note  ____________________________________________  Time seen: Approximately 12:22 AM  I have reviewed the triage vital signs and the nursing notes.   HISTORY  Chief Complaint Flank Pain    HPI Mindy Cook is a 81 y.o. female who presents to the ED via EMS for right flank pain. Patient states she has been hurting in her right lower back times one week; saw PCP yesterday and diagnosed with UTI. States history of nephrolithiasis but it has been so long since her last attack that she does not remember what it feels like. Patient currently denies flank pain. Only voices complaints of sacral pain secondary to decubitus ulcer. Denies fever, chills, chest pain, shortness of breath, abdominal pain, vomiting, diarrhea, dysuria. Patient wears 2 L oxygen continuously.   Past Medical History  Diagnosis Date  . Asthma   . A-fib   . Arthritis   . Urinary tract infection   . Aortic stenosis   . Aortic insufficiency   . Hyperlipidemia   . Hypertension   . Sleep apnea   . CHF (congestive heart failure)   . History of DVT (deep vein thrombosis)   . Diabetes mellitus type 2, uncontrolled     Patient Active Problem List   Diagnosis Date Noted  . A-fib 07/21/2014  . CAFL (chronic airflow limitation) 07/21/2014  . H/O deep venous thrombosis 07/21/2014  . HLD (hyperlipidemia) 07/21/2014  . BP (high blood pressure) 07/21/2014  . Apnea, sleep 07/21/2014  . Complete rotator cuff rupture of left shoulder 07/06/2014  . Cervico-occipital neuralgia 07/15/2013  . Cervical osteoarthritis 06/28/2013  . DDD (degenerative disc disease), cervical 06/28/2013  . Muscle spasms of neck 06/28/2013  . Renal colic 06/12/2013  . Neuropathy 05/23/2013  . History of open heart surgery 05/07/2013  . Arthritis 04/23/2013  . Acquired cyst of kidney 11/20/2012  . Hypercapnemia 06/12/2012  . H/O aortic valve replacement 06/12/2012  . Congestive  heart failure 04/26/2012  . Heart failure 04/26/2012  . Absolute anemia 02/08/2012  . Aortic valve defect 02/08/2012  . Benign essential HTN 02/08/2012  . Bladder infection, chronic 02/08/2012  . Calculus of gallbladder 02/08/2012  . Calculus of kidney 02/08/2012  . Biliary calculi 02/08/2012  . Dermatophytosis of scalp and beard 02/08/2012  . Incomplete bladder emptying 02/08/2012  . Disorder of mitral valve 02/08/2012  . Mixed incontinence 02/08/2012  . Diabetes mellitus, type 2 02/08/2012    Past Surgical History  Procedure Laterality Date  . Abdominal hysterectomy    . Cardiac catheterization    . Colonoscopy      Current Outpatient Rx  Name  Route  Sig  Dispense  Refill  . ALPRAZolam (XANAX) 0.25 MG tablet            0   . carvedilol (COREG) 3.125 MG tablet            0   . clotrimazole (LOTRIMIN) 1 % cream      APPLY TO THE AFFECTED AREA TWICE DAILY (MIX WITH ZINC OXIDE)      0   . ferrous sulfate 325 (65 FE) MG tablet   Oral   Take by mouth.         . Fluticasone-Salmeterol (ADVAIR) 250-50 MCG/DOSE AEPB   Inhalation   Inhale into the lungs.         . furosemide (LASIX) 40 MG tablet            0   . gabapentin (NEURONTIN)  300 MG capsule            0   . glimepiride (AMARYL) 1 MG tablet   Oral   Take 1 mg by mouth 2 (two) times daily.      0   . glucose blood (ACCU-CHEK COMPACT PLUS) test strip      Use 3 (three) times daily.         . meclizine (ANTIVERT) 12.5 MG tablet      take 1 tablet by mouth three times a day if needed for dizziness      0   . metFORMIN (GLUCOPHAGE) 500 MG tablet   Oral   Take by mouth.         . metolazone (ZAROXOLYN) 5 MG tablet            1   . mirabegron ER (MYRBETRIQ) 50 MG TB24 tablet   Oral   Take 1 tablet (50 mg total) by mouth daily.   30 tablet   2   . montelukast (SINGULAIR) 10 MG tablet            0   . nitrofurantoin (MACRODANTIN) 50 MG capsule   Oral   Take 1  capsule (50 mg total) by mouth at bedtime.   90 capsule   0   . potassium chloride SA (K-DUR,KLOR-CON) 20 MEQ tablet            0   . pravastatin (PRAVACHOL) 40 MG tablet      take 1 tablet by mouth at bedtime         . rOPINIRole (REQUIP) 0.25 MG tablet   Oral   Take 0.25 mg by mouth at bedtime.      0   . warfarin (COUMADIN) 5 MG tablet            0     Allergies Hydrocodone and Hydrocodone-acetaminophen  Family History  Problem Relation Age of Onset  . Heart failure Mother   . Cancer Father   . Heart disease Father   . Breast cancer Sister   . Fibromyalgia Sister     Social History History  Substance Use Topics  . Smoking status: Never Smoker   . Smokeless tobacco: Not on file  . Alcohol Use: No    Review of Systems Constitutional: No fever/chills Eyes: No visual changes. ENT: No sore throat. Cardiovascular: Denies chest pain. Respiratory: Denies shortness of breath. Gastrointestinal: No abdominal pain.  No nausea, no vomiting.  No diarrhea.  No constipation. Genitourinary: Negative for dysuria. Musculoskeletal: Positive for back pain. Skin: Negative for rash. Positive for pressure ulcer. Neurological: Negative for headaches, focal weakness or numbness.  10-point ROS otherwise negative.  ____________________________________________   PHYSICAL EXAM:  VITAL SIGNS: ED Triage Vitals  Enc Vitals Group     BP 07/25/14 0017 175/88 mmHg     Pulse Rate 07/25/14 0017 83     Resp 07/25/14 0017 18     Temp 07/25/14 0017 98.8 F (37.1 C)     Temp Source 07/25/14 0017 Oral     SpO2 07/25/14 0017 99 %     Weight 07/25/14 0017 185 lb (83.915 kg)     Height 07/25/14 0017 5\' 3"  (1.6 m)     Head Cir --      Peak Flow --      Pain Score --      Pain Loc --      Pain Edu? --  Excl. in GC? --     Constitutional: Alert and oriented. Well appearing and in no acute distress. Eyes: Conjunctivae are normal. PERRL. EOMI. Head: Atraumatic. Nose:  No congestion/rhinnorhea. Mouth/Throat: Mucous membranes are moist.  Oropharynx non-erythematous. Neck: No stridor.   Cardiovascular: Normal rate, regular rhythm. Grossly normal heart sounds.  Good peripheral circulation. Respiratory: Normal respiratory effort.  No retractions. Lungs CTAB. Gastrointestinal: Soft and nontender. No distention. No abdominal bruits. No CVA tenderness. Musculoskeletal: No lower extremity tenderness nor edema.  No joint effusions. Neurologic:  Normal speech and language. No gross focal neurologic deficits are appreciated.  Skin:  Stage I sacral decubitus ulcer noted. Skin is warm, dry and intact. No rash noted. Psychiatric: Mood and affect are normal. Speech and behavior are normal.  ____________________________________________   LABS (all labs ordered are listed, but only abnormal results are displayed)  Labs Reviewed  CBC WITH DIFFERENTIAL/PLATELET - Abnormal; Notable for the following:    RBC 3.29 (*)    Hemoglobin 9.9 (*)    HCT 29.7 (*)    RDW 15.8 (*)    Neutro Abs 7.2 (*)    Lymphs Abs 0.4 (*)    Monocytes Absolute 0.1 (*)    All other components within normal limits  BASIC METABOLIC PANEL - Abnormal; Notable for the following:    Chloride 85 (*)    CO2 39 (*)    Glucose, Bld 309 (*)    BUN 42 (*)    Creatinine, Ser 1.17 (*)    GFR calc non Af Amer 43 (*)    GFR calc Af Amer 50 (*)    All other components within normal limits  URINALYSIS COMPLETEWITH MICROSCOPIC ([facility] ONLY) - Abnormal; Notable for the following:    Color, Urine YELLOW (*)    APPearance CLEAR (*)    Glucose, UA 150 (*)    Hgb urine dipstick 1+ (*)    Squamous Epithelial / LPF 0-5 (*)    All other components within normal limits  PROTIME-INR - Abnormal; Notable for the following:    Prothrombin Time 32.2 (*)    All other components within normal limits    ____________________________________________  EKG  None ____________________________________________  RADIOLOGY  CT renal stone study interpreted per Dr. Andria MeuseStevens: Nonobstructing intrarenal stones in the upper pole right kidney. No ureteral stones or obstruction demonstrated. Cholelithiasis. Vascular calcifications. Cardiac enlargement. Atelectasis or fibrosis in the lung bases. ____________________________________________   PROCEDURES  Procedure(s) performed: None  Critical Care performed: No  ____________________________________________   INITIAL IMPRESSION / ASSESSMENT AND PLAN / ED COURSE  Pertinent labs & imaging results that were available during my care of the patient were reviewed by me and considered in my medical decision making (see chart for details).  81 year old female who presents with right flank pain, recently diagnosed with UTI. Patient denies pain currently. Will hold off on imaging studies. Will obtain screening lab work, urinalysis and reassess. Duoderm applied for sacral decubitus ulcer.  ----------------------------------------- 2:35 AM on 07/25/2014 -----------------------------------------  Patient resting in no acute distress. Awaiting INR results. Urinalysis remarkable only for microscopic hematuria. Given patient's history of nephrolithiasis and given that she is on anticoagulation with warfarin, will obtain renal colic CT to evaluate for stones/hemorrhage.  ----------------------------------------- 3:47 AM on 07/25/2014 -----------------------------------------  Updated patient and daughter of laboratory and imaging results. Patient is eager for discharge. Advise close follow-up with PCP. Strict return precautions given. Patient verbalizes understanding and agree with plan of care. ____________________________________________   FINAL CLINICAL IMPRESSION(S) / ED  DIAGNOSES  Final diagnoses:  Flank pain  Sacral decubitus ulcer, stage I       Irean Hong, MD 07/25/14 [phone]

## 2014-07-25 NOTE — ED Notes (Signed)
Pt in with right lower back pain x 1 week saw pmd yest and dx with UTI.   FSBS 273 per ems

## 2014-08-08 ENCOUNTER — Telehealth: Payer: Self-pay

## 2014-08-08 NOTE — Telephone Encounter (Signed)
She should return to the office for a catheterized repeat urine culture. Her macrobid should be helping to control some of her symptoms and I worry that she is developing worsening resistance.   In the meantime, she can use Pyridium up to 3 times a day for a few days to help with her burning. She should present to the ER she develops any fevers or chills.  Vanna Scotland, MD

## 2014-08-08 NOTE — Telephone Encounter (Signed)
Pt is a Dr. Marlou Porch pt. Pt called stating the macrobid is not helping with her s/s. Reinforced with pt the macrobid is for suppressive therapy. Pt stated she is also taking myrbetriq , cranberry tabs, and probiotic. Pt c/o burning on urination. Please advise.

## 2014-08-08 NOTE — Telephone Encounter (Signed)
Spoke with pt in reference to Dr. Assunta Gambles orders. Pt stated she would have to call back to make nurse appt. Pt states she does not drive and would need to find a ride. Pt denied any fever or chills right now.

## 2014-08-16 ENCOUNTER — Emergency Department: Payer: Medicare Other

## 2014-08-16 ENCOUNTER — Emergency Department
Admission: EM | Admit: 2014-08-16 | Discharge: 2014-08-16 | Disposition: A | Discharge status: Home / Self Care | Payer: Medicare Other | Attending: Emergency Medicine | Admitting: Emergency Medicine

## 2014-08-16 ENCOUNTER — Encounter: Payer: Self-pay | Admitting: Emergency Medicine

## 2014-08-16 DIAGNOSIS — R300 Dysuria: Secondary | ICD-10-CM | POA: Diagnosis not present

## 2014-08-16 DIAGNOSIS — Z9981 Dependence on supplemental oxygen: Secondary | ICD-10-CM | POA: Insufficient documentation

## 2014-08-16 DIAGNOSIS — M545 Low back pain: Secondary | ICD-10-CM | POA: Insufficient documentation

## 2014-08-16 DIAGNOSIS — R109 Unspecified abdominal pain: Secondary | ICD-10-CM | POA: Insufficient documentation

## 2014-08-16 DIAGNOSIS — R110 Nausea: Secondary | ICD-10-CM | POA: Diagnosis not present

## 2014-08-16 DIAGNOSIS — R2240 Localized swelling, mass and lump, unspecified lower limb: Secondary | ICD-10-CM | POA: Insufficient documentation

## 2014-08-16 DIAGNOSIS — I10 Essential (primary) hypertension: Secondary | ICD-10-CM | POA: Diagnosis not present

## 2014-08-16 DIAGNOSIS — M79606 Pain in leg, unspecified: Secondary | ICD-10-CM | POA: Diagnosis not present

## 2014-08-16 DIAGNOSIS — G8929 Other chronic pain: Secondary | ICD-10-CM | POA: Insufficient documentation

## 2014-08-16 DIAGNOSIS — E119 Type 2 diabetes mellitus without complications: Secondary | ICD-10-CM | POA: Diagnosis not present

## 2014-08-16 DIAGNOSIS — R1030 Lower abdominal pain, unspecified: Secondary | ICD-10-CM | POA: Diagnosis present

## 2014-08-16 DIAGNOSIS — E876 Hypokalemia: Secondary | ICD-10-CM

## 2014-08-16 DIAGNOSIS — R52 Pain, unspecified: Secondary | ICD-10-CM

## 2014-08-16 LAB — BASIC METABOLIC PANEL
Anion gap: 13 (ref 5–15)
Anion gap: 14 (ref 5–15)
BUN: 50 mg/dL — AB (ref 6–20)
BUN: 48 mg/dL — AB (ref 6–20)
CALCIUM: 9.3 mg/dL (ref 8.9–10.3)
CHLORIDE: 80 mmol/L — AB (ref 101–111)
CO2: 44 mmol/L — AB (ref 22–32)
CO2: 40 mmol/L — ABNORMAL HIGH (ref 22–32)
Calcium: 10.1 mg/dL (ref 8.9–10.3)
Chloride: 85 mmol/L — ABNORMAL LOW (ref 101–111)
Creatinine, Ser: 1.08 mg/dL — ABNORMAL HIGH (ref 0.44–1.00)
Creatinine, Ser: 1 mg/dL (ref 0.44–1.00)
GFR calc Af Amer: 55 mL/min — ABNORMAL LOW (ref >60)
GFR calc Af Amer: >60 mL/min (ref >60)
GFR calc non Af Amer: 48 mL/min — ABNORMAL LOW (ref >60)
GFR calc non Af Amer: 52 mL/min — ABNORMAL LOW (ref >60)
GLUCOSE: 260 mg/dL — AB (ref 65–99)
GLUCOSE: 207 mg/dL — AB (ref 65–99)
POTASSIUM: 3.1 mmol/L — AB (ref 3.5–5.1)
Potassium: 2.8 mmol/L — CL (ref 3.5–5.1)
Sodium: 137 mmol/L (ref 135–145)
Sodium: 139 mmol/L (ref 135–145)

## 2014-08-16 LAB — URINALYSIS COMPLETE WITH MICROSCOPIC (ARMC ONLY)
BACTERIA UA: NONE SEEN
Bilirubin Urine: NEGATIVE
Glucose, UA: NEGATIVE mg/dL
HGB URINE DIPSTICK: NEGATIVE
Ketones, ur: NEGATIVE mg/dL
LEUKOCYTES UA: NEGATIVE
Nitrite: POSITIVE — AB
PH: 8 (ref 5.0–8.0)
PROTEIN: NEGATIVE mg/dL
Specific Gravity, Urine: 1.006 (ref 1.005–1.030)

## 2014-08-16 LAB — CBC
HCT: 31.1 % — ABNORMAL LOW (ref 35.0–47.0)
HEMOGLOBIN: 10.6 g/dL — AB (ref 12.0–16.0)
MCH: 30.8 pg (ref 26.0–34.0)
MCHC: 34 g/dL (ref 32.0–36.0)
MCV: 90.5 fL (ref 80.0–100.0)
PLATELETS: 158 10*3/uL (ref 150–440)
RBC: 3.43 MIL/uL — ABNORMAL LOW (ref 3.80–5.20)
RDW: 15.9 % — ABNORMAL HIGH (ref 11.5–14.5)
WBC: 9.6 10*3/uL (ref 3.6–11.0)

## 2014-08-16 MED ORDER — POTASSIUM CHLORIDE ER 10 MEQ PO TBCR: 10.0000 meq | EXTENDED_RELEASE_TABLET | Freq: Every day | ORAL | Status: DC

## 2014-08-16 MED ORDER — SODIUM CHLORIDE 0.9 % IV SOLN
Freq: Once | INTRAVENOUS | Status: AC
  Administered 2014-08-16: 12:00:00 via INTRAVENOUS
  Filled 2014-08-16: qty 1000

## 2014-08-16 MED ORDER — LORAZEPAM 2 MG/ML IJ SOLN
1.0000 mg | Freq: Once | INTRAMUSCULAR | Status: AC
  Administered 2014-08-16: 1 mg via INTRAVENOUS
  Filled 2014-08-16: qty 1

## 2014-08-16 MED ORDER — POTASSIUM CHLORIDE CRYS ER 20 MEQ PO TBCR
60.0000 meq | EXTENDED_RELEASE_TABLET | Freq: Once | ORAL | Status: AC
  Administered 2014-08-16: 60 meq via ORAL
  Filled 2014-08-16: qty 3

## 2014-08-16 MED ORDER — MORPHINE SULFATE 4 MG/ML IJ SOLN
4.0000 mg | Freq: Once | INTRAMUSCULAR | Status: AC
  Administered 2014-08-16: 4 mg via INTRAVENOUS
  Filled 2014-08-16: qty 1

## 2014-08-16 MED ORDER — OXYCODONE-ACETAMINOPHEN 5-325 MG PO TABS: 1.0000 | ORAL_TABLET | Freq: Four times a day (QID) | ORAL | Status: DC | PRN

## 2014-08-16 MED ORDER — DIAZEPAM 5 MG PO TABS: 5.0000 mg | ORAL_TABLET | Freq: Three times a day (TID) | ORAL | Status: DC | PRN

## 2014-08-16 MED ORDER — ONDANSETRON HCL 4 MG/2ML IJ SOLN
4.0000 mg | Freq: Once | INTRAMUSCULAR | Status: AC
  Administered 2014-08-16: 4 mg via INTRAVENOUS
  Filled 2014-08-16: qty 2

## 2014-08-16 NOTE — ED Notes (Signed)
Pt had recent visit to the ed and told she had kidney infection. Pt states she is not improving, she has nausea, pain and body aches, and buring with urination. Pt states she needs to be admitted to the hospital.

## 2014-08-16 NOTE — Discharge Instructions (Signed)
Pain of Unknown Etiology (Pain Without a Known Cause) You have come to your caregiver because of pain. Pain can occur in any part of the body. Often there is not a definite cause. If your laboratory (blood or urine) work was normal and X-rays or other studies were normal, your caregiver may treat you without knowing the cause of the pain. An example of this is the headache. Most headaches are diagnosed by taking a history. This means your caregiver asks you questions about your headaches. Your caregiver determines a treatment based on your answers. Usually testing done for headaches is normal. Often testing is not done unless there is no response to medications. Regardless of where your pain is located today, you can be given medications to make you comfortable. If no physical cause of pain can be found, most cases of pain will gradually leave as suddenly as they came.  If you have a painful condition and no reason can be found for the pain, it is important that you follow up with your caregiver. If the pain becomes worse or does not go away, it may be necessary to repeat tests and look further for a possible cause.  Only take over-the-counter or prescription medicines for pain, discomfort, or fever as directed by your caregiver.  For the protection of your privacy, test results cannot be given over the phone. Make sure you receive the results of your test. Ask how these results are to be obtained if you have not been informed. It is your responsibility to obtain your test results.  You may continue all activities unless the activities cause more pain. When the pain lessens, it is important to gradually resume normal activities. Resume activities by beginning slowly and gradually increasing the intensity and duration of the activities or exercise. During periods of severe pain, bed rest may be helpful. Lie or sit in any position that is comfortable.  Ice used for acute (sudden) conditions may be effective.  Use a large plastic bag filled with ice and wrapped in a towel. This may provide pain relief.  See your caregiver for continued problems. Your caregiver can help or refer you for exercises or physical therapy if necessary. If you were given medications for your condition, do not drive, operate machinery or power tools, or sign legal documents for 24 hours. Do not drink alcohol, take sleeping pills, or take other medications that may interfere with treatment. See your caregiver immediately if you have pain that is becoming worse and not relieved by medications. Document Released: 09/21/2000 Document Revised: 10/17/2012 Document Reviewed: 12/27/2004 [facility] Patient Information 2015 [place], [place]. This information is not intended to replace advice given to you by your health care provider. Make sure you discuss any questions you have with your health care provider.  Abdominal Pain Many things can cause abdominal pain. Usually, abdominal pain is not caused by a disease and will improve without treatment. It can often be observed and treated at home. Your health care provider will do a physical exam and possibly order blood tests and X-rays to help determine the seriousness of your pain. However, in many cases, more time must pass before a clear cause of the pain can be found. Before that point, your health care provider may not know if you need more testing or further treatment. HOME CARE INSTRUCTIONS  Monitor your abdominal pain for any changes. The following actions may help to alleviate any discomfort you are experiencing:  Only take over-the-counter or prescription medicines as  directed by your health care provider.  Do not take laxatives unless directed to do so by your health care provider.  Try a clear liquid diet (broth, tea, or water) as directed by your health care provider. Slowly move to a bland diet as tolerated. SEEK MEDICAL CARE IF:  You have unexplained abdominal pain.  You have  abdominal pain associated with nausea or diarrhea.  You have pain when you urinate or have a bowel movement.  You experience abdominal pain that wakes you in the night.  You have abdominal pain that is worsened or improved by eating food.  You have abdominal pain that is worsened with eating fatty foods.  You have a fever. SEEK IMMEDIATE MEDICAL CARE IF:   Your pain does not go away within 2 hours.  You keep throwing up (vomiting).  Your pain is felt only in portions of the abdomen, such as the right side or the left lower portion of the abdomen.  You pass bloody or black tarry stools. MAKE SURE YOU:  Understand these instructions.   Will watch your condition.   Will get help right away if you are not doing well or get worse.  Document Released: 10/06/2004 Document Revised: 01/01/2013 Document Reviewed: 09/05/2012 [facility] Patient Information 2015 [place], [place]. This information is not intended to replace advice given to you by your health care provider. Make sure you discuss any questions you have with your health care provider.  Hypokalemia Hypokalemia means that the amount of potassium in the blood is lower than normal.Potassium is a chemical, called an electrolyte, that helps regulate the amount of fluid in the body. It also stimulates muscle contraction and helps nerves function properly.Most of the body's potassium is inside of cells, and only a very small amount is in the blood. Because the amount in the blood is so small, minor changes can be life-threatening. CAUSES  Antibiotics.  Diarrhea or vomiting.  Using laxatives too much, which can cause diarrhea.  Chronic kidney disease.  Water pills (diuretics).  Eating disorders (bulimia).  Low magnesium level.  Sweating a lot. SIGNS AND SYMPTOMS  Weakness.  Constipation.  Fatigue.  Muscle cramps.  Mental confusion.  Skipped heartbeats or irregular heartbeat (palpitations).  Tingling or  numbness. DIAGNOSIS  Your health care provider can diagnose hypokalemia with blood tests. In addition to checking your potassium level, your health care provider may also check other lab tests. TREATMENT Hypokalemia can be treated with potassium supplements taken by mouth or adjustments in your current medicines. If your potassium level is very low, you may need to get potassium through a vein (IV) and be monitored in the hospital. A diet high in potassium is also helpful. Foods high in potassium are:  Nuts, such as peanuts and pistachios.  Seeds, such as sunflower seeds and pumpkin seeds.  Peas, lentils, and lima beans.  Whole grain and bran cereals and breads.  Fresh fruit and vegetables, such as apricots, avocado, bananas, cantaloupe, kiwi, oranges, tomatoes, asparagus, and potatoes.  Orange and tomato juices.  Red meats.  Fruit yogurt. HOME CARE INSTRUCTIONS  Take all medicines as prescribed by your health care provider.  Maintain a healthy diet by including nutritious food, such as fruits, vegetables, nuts, whole grains, and lean meats.  If you are taking a laxative, be sure to follow the directions on the label. SEEK MEDICAL CARE IF:  Your weakness gets worse.  You feel your heart pounding or racing.  You are vomiting or having diarrhea.  You  are diabetic and having trouble keeping your blood glucose in the normal range. SEEK IMMEDIATE MEDICAL CARE IF:  You have chest pain, shortness of breath, or dizziness.  You are vomiting or having diarrhea for more than 2 days.  You faint. MAKE SURE YOU:   Understand these instructions.  Will watch your condition.  Will get help right away if you are not doing well or get worse. Document Released: 12/27/2004 Document Revised: 10/17/2012 Document Reviewed: 06/29/2012 [facility] Patient Information 2015 [place], [place]. This information is not intended to replace advice given to you by your health care provider. Make sure  you discuss any questions you have with your health care provider.

## 2014-08-16 NOTE — ED Provider Notes (Signed)
[facility] Emergency Department Provider Note     Time seen: ----------------------------------------- 11:15 AM on 08/16/2014 -----------------------------------------    I have reviewed the triage vital signs and the nursing notes.   HISTORY  Chief Complaint Generalized Body Aches; Flank Pain; Dysuria; Abdominal Pain; and Nausea    HPI Mindy Cook is a 81 y.o. female who presents ER for generalized pain and nausea. Patient states "she is feels so sick". Patient is requesting admission to the hospital because she feels so sick. She states she has nausea, pain and body aches including low back pain and right flank pain, lower abdominal pain. Patient does complain of chronic leg pain and swelling. Patient denies fevers chills or other complaints.   Past Medical History  Diagnosis Date  . Asthma   . A-fib   . Arthritis   . Urinary tract infection   . Aortic stenosis   . Aortic insufficiency   . Hyperlipidemia   . Hypertension   . Sleep apnea   . CHF (congestive heart failure)   . History of DVT (deep vein thrombosis)   . Diabetes mellitus type 2, uncontrolled     Patient Active Problem List   Diagnosis Date Noted  . A-fib 07/21/2014  . CAFL (chronic airflow limitation) 07/21/2014  . H/O deep venous thrombosis 07/21/2014  . HLD (hyperlipidemia) 07/21/2014  . BP (high blood pressure) 07/21/2014  . Apnea, sleep 07/21/2014  . Complete rotator cuff rupture of left shoulder 07/06/2014  . Cervico-occipital neuralgia 07/15/2013  . Cervical osteoarthritis 06/28/2013  . DDD (degenerative disc disease), cervical 06/28/2013  . Muscle spasms of neck 06/28/2013  . Renal colic 06/12/2013  . Neuropathy 05/23/2013  . History of open heart surgery 05/07/2013  . Arthritis 04/23/2013  . Acquired cyst of kidney 11/20/2012  . Hypercapnemia 06/12/2012  . H/O aortic valve replacement 06/12/2012  . Congestive heart failure 04/26/2012  . Heart failure  04/26/2012  . Absolute anemia 02/08/2012  . Aortic valve defect 02/08/2012  . Benign essential HTN 02/08/2012  . Bladder infection, chronic 02/08/2012  . Calculus of gallbladder 02/08/2012  . Calculus of kidney 02/08/2012  . Biliary calculi 02/08/2012  . Dermatophytosis of scalp and beard 02/08/2012  . Incomplete bladder emptying 02/08/2012  . Disorder of mitral valve 02/08/2012  . Mixed incontinence 02/08/2012  . Diabetes mellitus, type 2 02/08/2012    Past Surgical History  Procedure Laterality Date  . Abdominal hysterectomy    . Cardiac catheterization    . Colonoscopy      Allergies Hydrocodone and Hydrocodone-acetaminophen  Social History History  Substance Use Topics  . Smoking status: Never Smoker   . Smokeless tobacco: Not on file  . Alcohol Use: No    Review of Systems Constitutional: Negative for fever. Eyes: Negative for visual changes. ENT: Negative for sore throat. Cardiovascular: Negative for chest pain. Respiratory: Negative for shortness of breath. Gastrointestinal: Patient complaining of abdominal pain particularly in the right flank Genitourinary: Positive for dysuria Musculoskeletal: Positive for right-sided low back pain Skin: Negative for rash. Neurological: Negative for headaches, positive for weakness  10-point ROS otherwise negative.  ____________________________________________   PHYSICAL EXAM:  VITAL SIGNS: ED Triage Vitals  Enc Vitals Group     BP 08/16/14 1057 142/69 mmHg     Pulse Rate 08/16/14 1057 92     Resp 08/16/14 1057 20     Temp 08/16/14 1057 98 F (36.7 C)     Temp src --      SpO2  08/16/14 1057 95 %     Weight 08/16/14 1057 189 lb (85.73 kg)     Height 08/16/14 1057 5\' 3"  (1.6 m)     Head Cir --      Peak Flow --      Pain Score 08/16/14 1059 9     Pain Loc --      Pain Edu? --      Excl. in GC? --     Constitutional: Alert and oriented. Well appearing and in no distress. Eyes: Conjunctivae are normal.  PERRL. Normal extraocular movements. ENT   Head: Normocephalic and atraumatic.   Nose: No congestion/rhinnorhea.   Mouth/Throat: Mucous membranes are moist.   Neck: No stridor. Cardiovascular: Normal rate, regular rhythm. Normal and symmetric distal pulses are present in all extremities. No murmurs, rubs, or gallops. Respiratory: Normal respiratory effort without tachypnea nor retractions. Breath sounds are clear and equal bilaterally. Prolonged expiration, on home O2 Gastrointestinal: Soft and nontender. No distention. No abdominal bruits.  Musculoskeletal: Nontender with normal range of motion in all extremities. No joint effusions.  No lower extremity tenderness nor edema. Neurologic:  Normal speech and language. No gross focal neurologic deficits are appreciated. Speech is normal. No gait instability. Skin:  Skin is warm, dry and intact. No rash noted. Psychiatric: Mood and affect are normal. Speech and behavior are normal. Patient exhibits appropriate insight and judgment. ____________________________________________  ED COURSE:  Pertinent labs & imaging results that were available during my care of the patient were reviewed by me and considered in my medical decision making (see chart for details). Patient with general malaise, will need basic labs and urine. We'll give IV morphine and Zofran. ____________________________________________    LABS (pertinent positives/negatives)  Labs Reviewed  BASIC METABOLIC PANEL - Abnormal; Notable for the following:    Potassium 2.8 (*)    Chloride 80 (*)    CO2 44 (*)    Glucose, Bld 260 (*)    BUN 50 (*)    Creatinine, Ser 1.08 (*)    GFR calc non Af Amer 48 (*)    GFR calc Af Amer 55 (*)    All other components within normal limits  CBC - Abnormal; Notable for the following:    RBC 3.43 (*)    Hemoglobin 10.6 (*)    HCT 31.1 (*)    RDW 15.9 (*)    All other components within normal limits  URINALYSIS COMPLETEWITH  MICROSCOPIC ([facility] ONLY) - Abnormal; Notable for the following:    Color, Urine AMBER (*)    APPearance CLEAR (*)    Nitrite POSITIVE (*)    Squamous Epithelial / LPF 0-5 (*)    All other components within normal limits  BASIC METABOLIC PANEL - Abnormal; Notable for the following:    Potassium 3.1 (*)    Chloride 85 (*)    CO2 40 (*)    Glucose, Bld 207 (*)    BUN 48 (*)    GFR calc non Af Amer 52 (*)    All other components within normal limits    RADIOLOGY Images were viewed by me Abdomen flat and erect  IMPRESSION: 1. No acute findings. Normal bowel gas pattern. 2. Right intrarenal stones. ____________________________________________  FINAL ASSESSMENT AND PLAN  Generalized pain, nausea, hypokalemia  Plan: Patient with labs and imaging as dictated above. Patient will repeat chemistries which show improvement. She was discharged with a week's course of potassium to take orally. Urine is clear, she is advised this  is not an active kidney stone. This is likely caused by chronic pain. Will discharge patient with a short supply of pain medication as well as Valium for muscle spasms and restless leg.   Emily Filbert, MD   Emily Filbert, MD 08/16/14 [phone]

## 2014-08-16 NOTE — ED Notes (Signed)
Patient to ED with c/o generalized body aches, reports she is just hurting all over and wants to be put in hospital.

## 2014-08-16 NOTE — Care Management Note (Signed)
Case Management Note  Patient Details  Name: [name] MRN: [id number] Date of Birth: 28-Apr-1933  Subjective/Objective:    Per request by Dr.Williams I spoke with patient and her daughter. Patient currently has home health nursing, with physical therapy in the past. Will follow up Monday with PCP Dr.M.McLaughlin to determine need for continued physical therapy or potential rehab ,patient not willing to go to rehab at this time. Good results with [facility] in the past. Dr.Williams informed of conversation. Case closed              Action/Plan:   Expected Discharge Date:                  Expected Discharge Plan:     In-House Referral:     Discharge planning Services     Post Acute Care Choice:    Choice offered to:     DME Arranged:    DME Agency:     HH Arranged:    HH Agency:     Status of Service:     Medicare Important Message Given:    Date Medicare IM Given:    Medicare IM give by:    Date Additional Medicare IM Given:    Additional Medicare Important Message give by:     If discussed at Long Length of Stay Meetings, dates discussed:    Additional Comments:  Caren Macadam, RN 08/16/2014, 2:41 PM

## 2014-08-28 ENCOUNTER — Emergency Department: Payer: Medicare Other

## 2014-08-28 ENCOUNTER — Encounter: Payer: Self-pay | Admitting: Emergency Medicine

## 2014-08-28 ENCOUNTER — Observation Stay
Admission: EM | Admit: 2014-08-28 | Discharge: 2014-09-01 | Disposition: A | Discharge status: Skilled Nursing Facility | Payer: Medicare Other | Attending: Internal Medicine | Admitting: Internal Medicine

## 2014-08-28 DIAGNOSIS — F419 Anxiety disorder, unspecified: Secondary | ICD-10-CM | POA: Diagnosis not present

## 2014-08-28 DIAGNOSIS — Z90710 Acquired absence of both cervix and uterus: Secondary | ICD-10-CM | POA: Diagnosis not present

## 2014-08-28 DIAGNOSIS — Z9889 Other specified postprocedural states: Secondary | ICD-10-CM | POA: Insufficient documentation

## 2014-08-28 DIAGNOSIS — M7989 Other specified soft tissue disorders: Secondary | ICD-10-CM | POA: Diagnosis not present

## 2014-08-28 DIAGNOSIS — Z803 Family history of malignant neoplasm of breast: Secondary | ICD-10-CM | POA: Insufficient documentation

## 2014-08-28 DIAGNOSIS — I10 Essential (primary) hypertension: Secondary | ICD-10-CM | POA: Diagnosis not present

## 2014-08-28 DIAGNOSIS — T380X5A Adverse effect of glucocorticoids and synthetic analogues, initial encounter: Secondary | ICD-10-CM | POA: Insufficient documentation

## 2014-08-28 DIAGNOSIS — Z7951 Long term (current) use of inhaled steroids: Secondary | ICD-10-CM | POA: Diagnosis not present

## 2014-08-28 DIAGNOSIS — I340 Nonrheumatic mitral (valve) insufficiency: Secondary | ICD-10-CM | POA: Diagnosis not present

## 2014-08-28 DIAGNOSIS — M109 Gout, unspecified: Secondary | ICD-10-CM

## 2014-08-28 DIAGNOSIS — J9621 Acute and chronic respiratory failure with hypoxia: Principal | ICD-10-CM

## 2014-08-28 DIAGNOSIS — R0902 Hypoxemia: Secondary | ICD-10-CM

## 2014-08-28 DIAGNOSIS — D649 Anemia, unspecified: Secondary | ICD-10-CM | POA: Insufficient documentation

## 2014-08-28 DIAGNOSIS — E1140 Type 2 diabetes mellitus with diabetic neuropathy, unspecified: Secondary | ICD-10-CM | POA: Diagnosis not present

## 2014-08-28 DIAGNOSIS — E785 Hyperlipidemia, unspecified: Secondary | ICD-10-CM | POA: Diagnosis present

## 2014-08-28 DIAGNOSIS — N281 Cyst of kidney, acquired: Secondary | ICD-10-CM | POA: Diagnosis not present

## 2014-08-28 DIAGNOSIS — I509 Heart failure, unspecified: Secondary | ICD-10-CM | POA: Insufficient documentation

## 2014-08-28 DIAGNOSIS — Z8249 Family history of ischemic heart disease and other diseases of the circulatory system: Secondary | ICD-10-CM | POA: Insufficient documentation

## 2014-08-28 DIAGNOSIS — Z79899 Other long term (current) drug therapy: Secondary | ICD-10-CM | POA: Insufficient documentation

## 2014-08-28 DIAGNOSIS — Z87442 Personal history of urinary calculi: Secondary | ICD-10-CM | POA: Insufficient documentation

## 2014-08-28 DIAGNOSIS — G2581 Restless legs syndrome: Secondary | ICD-10-CM | POA: Diagnosis not present

## 2014-08-28 DIAGNOSIS — G4733 Obstructive sleep apnea (adult) (pediatric): Secondary | ICD-10-CM | POA: Diagnosis not present

## 2014-08-28 DIAGNOSIS — S0990XA Unspecified injury of head, initial encounter: Secondary | ICD-10-CM | POA: Diagnosis not present

## 2014-08-28 DIAGNOSIS — N390 Urinary tract infection, site not specified: Secondary | ICD-10-CM

## 2014-08-28 DIAGNOSIS — Z791 Long term (current) use of non-steroidal anti-inflammatories (NSAID): Secondary | ICD-10-CM | POA: Insufficient documentation

## 2014-08-28 DIAGNOSIS — Z7901 Long term (current) use of anticoagulants: Secondary | ICD-10-CM | POA: Diagnosis not present

## 2014-08-28 DIAGNOSIS — J440 Chronic obstructive pulmonary disease with acute lower respiratory infection: Secondary | ICD-10-CM | POA: Insufficient documentation

## 2014-08-28 DIAGNOSIS — I482 Chronic atrial fibrillation: Secondary | ICD-10-CM | POA: Insufficient documentation

## 2014-08-28 DIAGNOSIS — J441 Chronic obstructive pulmonary disease with (acute) exacerbation: Secondary | ICD-10-CM | POA: Diagnosis present

## 2014-08-28 DIAGNOSIS — M5481 Occipital neuralgia: Secondary | ICD-10-CM | POA: Diagnosis not present

## 2014-08-28 DIAGNOSIS — K8020 Calculus of gallbladder without cholecystitis without obstruction: Secondary | ICD-10-CM | POA: Insufficient documentation

## 2014-08-28 DIAGNOSIS — J45909 Unspecified asthma, uncomplicated: Secondary | ICD-10-CM | POA: Diagnosis not present

## 2014-08-28 DIAGNOSIS — J9620 Acute and chronic respiratory failure, unspecified whether with hypoxia or hypercapnia: Secondary | ICD-10-CM | POA: Diagnosis present

## 2014-08-28 DIAGNOSIS — Z809 Family history of malignant neoplasm, unspecified: Secondary | ICD-10-CM | POA: Diagnosis not present

## 2014-08-28 DIAGNOSIS — Z952 Presence of prosthetic heart valve: Secondary | ICD-10-CM | POA: Diagnosis not present

## 2014-08-28 DIAGNOSIS — I4891 Unspecified atrial fibrillation: Secondary | ICD-10-CM | POA: Diagnosis present

## 2014-08-28 DIAGNOSIS — R609 Edema, unspecified: Secondary | ICD-10-CM

## 2014-08-28 DIAGNOSIS — Z86718 Personal history of other venous thrombosis and embolism: Secondary | ICD-10-CM | POA: Diagnosis not present

## 2014-08-28 DIAGNOSIS — N3946 Mixed incontinence: Secondary | ICD-10-CM | POA: Diagnosis not present

## 2014-08-28 DIAGNOSIS — Z8489 Family history of other specified conditions: Secondary | ICD-10-CM | POA: Diagnosis not present

## 2014-08-28 DIAGNOSIS — R05 Cough: Secondary | ICD-10-CM | POA: Diagnosis not present

## 2014-08-28 DIAGNOSIS — W19XXXA Unspecified fall, initial encounter: Secondary | ICD-10-CM | POA: Diagnosis present

## 2014-08-28 DIAGNOSIS — R0989 Other specified symptoms and signs involving the circulatory and respiratory systems: Secondary | ICD-10-CM

## 2014-08-28 DIAGNOSIS — K5900 Constipation, unspecified: Secondary | ICD-10-CM | POA: Diagnosis not present

## 2014-08-28 DIAGNOSIS — R531 Weakness: Secondary | ICD-10-CM

## 2014-08-28 DIAGNOSIS — M1990 Unspecified osteoarthritis, unspecified site: Secondary | ICD-10-CM | POA: Insufficient documentation

## 2014-08-28 DIAGNOSIS — M25569 Pain in unspecified knee: Secondary | ICD-10-CM

## 2014-08-28 DIAGNOSIS — W1809XA Striking against other object with subsequent fall, initial encounter: Secondary | ICD-10-CM

## 2014-08-28 DIAGNOSIS — E119 Type 2 diabetes mellitus without complications: Secondary | ICD-10-CM

## 2014-08-28 DIAGNOSIS — J209 Acute bronchitis, unspecified: Secondary | ICD-10-CM

## 2014-08-28 HISTORY — DX: Spondylolysis, cervical region: M43.02

## 2014-08-28 HISTORY — DX: Other cervical disc degeneration, unspecified cervical region: M50.30

## 2014-08-28 HISTORY — DX: Chronic obstructive pulmonary disease, unspecified: J44.9

## 2014-08-28 HISTORY — DX: Rheumatic mitral valve disease, unspecified: I05.9

## 2014-08-28 HISTORY — DX: Anemia, unspecified: D64.9

## 2014-08-28 LAB — COMPREHENSIVE METABOLIC PANEL
ALK PHOS: 61 U/L (ref 38–126)
ALT: 16 U/L (ref 14–54)
ANION GAP: 6 (ref 5–15)
AST: 18 U/L (ref 15–41)
Albumin: 3.7 g/dL (ref 3.5–5.0)
BUN: 33 mg/dL — ABNORMAL HIGH (ref 6–20)
CO2: 45 mmol/L — ABNORMAL HIGH (ref 22–32)
Calcium: 9.7 mg/dL (ref 8.9–10.3)
Chloride: 91 mmol/L — ABNORMAL LOW (ref 101–111)
Creatinine, Ser: 1.11 mg/dL — ABNORMAL HIGH (ref 0.44–1.00)
GFR calc non Af Amer: 46 mL/min — ABNORMAL LOW (ref >60)
GFR, EST AFRICAN AMERICAN: 53 mL/min — AB (ref >60)
Glucose, Bld: 137 mg/dL — ABNORMAL HIGH (ref 65–99)
POTASSIUM: 4.2 mmol/L (ref 3.5–5.1)
SODIUM: 142 mmol/L (ref 135–145)
TOTAL PROTEIN: 7 g/dL (ref 6.5–8.1)
Total Bilirubin: 0.4 mg/dL (ref 0.3–1.2)

## 2014-08-28 LAB — URINALYSIS COMPLETE WITH MICROSCOPIC (ARMC ONLY)
Bilirubin Urine: NEGATIVE
Glucose, UA: NEGATIVE mg/dL
KETONES UR: NEGATIVE mg/dL
NITRITE: POSITIVE — AB
PH: 5 (ref 5.0–8.0)
PROTEIN: 30 mg/dL — AB
Specific Gravity, Urine: 1.015 (ref 1.005–1.030)
Squamous Epithelial / LPF: NONE SEEN

## 2014-08-28 LAB — PROTIME-INR
INR: 1.74
Prothrombin Time: 20.5 seconds — ABNORMAL HIGH (ref 11.4–15.0)

## 2014-08-28 LAB — CBC WITH DIFFERENTIAL/PLATELET
BASOS ABS: 0 10*3/uL (ref 0–0.1)
BASOS PCT: 1 %
Eosinophils Absolute: 0.1 10*3/uL (ref 0–0.7)
Eosinophils Relative: 1 %
HEMATOCRIT: 28.8 % — AB (ref 35.0–47.0)
HEMOGLOBIN: 9.2 g/dL — AB (ref 12.0–16.0)
LYMPHS PCT: 12 %
Lymphs Abs: 1 10*3/uL (ref 1.0–3.6)
MCH: 29.6 pg (ref 26.0–34.0)
MCHC: 31.9 g/dL — ABNORMAL LOW (ref 32.0–36.0)
MCV: 92.8 fL (ref 80.0–100.0)
Monocytes Absolute: 0.6 10*3/uL (ref 0.2–0.9)
Monocytes Relative: 8 %
NEUTROS ABS: 6.2 10*3/uL (ref 1.4–6.5)
NEUTROS PCT: 78 %
Platelets: 142 10*3/uL — ABNORMAL LOW (ref 150–440)
RBC: 3.1 MIL/uL — AB (ref 3.80–5.20)
RDW: 15.5 % — ABNORMAL HIGH (ref 11.5–14.5)
WBC: 8 10*3/uL (ref 3.6–11.0)

## 2014-08-28 LAB — GLUCOSE, CAPILLARY: Glucose-Capillary: 248 mg/dL — ABNORMAL HIGH (ref 65–99)

## 2014-08-28 LAB — BRAIN NATRIURETIC PEPTIDE: B NATRIURETIC PEPTIDE 5: 349 pg/mL — AB (ref 0.0–100.0)

## 2014-08-28 LAB — TROPONIN I: Troponin I: 0.03 ng/mL (ref <0.031)

## 2014-08-28 MED ORDER — INSULIN ASPART 100 UNIT/ML ~~LOC~~ SOLN
0.0000 [IU] | Freq: Three times a day (TID) | SUBCUTANEOUS | Status: DC
  Administered 2014-08-29: 7 [IU] via SUBCUTANEOUS
  Administered 2014-08-29: 5 [IU] via SUBCUTANEOUS
  Administered 2014-08-29: 7 [IU] via SUBCUTANEOUS
  Administered 2014-08-30: 3 [IU] via SUBCUTANEOUS
  Administered 2014-08-30 (×2): 2 [IU] via SUBCUTANEOUS
  Administered 2014-08-31: 9 [IU] via SUBCUTANEOUS
  Administered 2014-08-31: 7 [IU] via SUBCUTANEOUS
  Administered 2014-08-31: 2 [IU] via SUBCUTANEOUS
  Filled 2014-08-28: qty 7
  Filled 2014-08-28: qty 2
  Filled 2014-08-28: qty 7
  Filled 2014-08-28: qty 2
  Filled 2014-08-28: qty 9
  Filled 2014-08-28: qty 5
  Filled 2014-08-28: qty 7
  Filled 2014-08-28: qty 3
  Filled 2014-08-28: qty 2

## 2014-08-28 MED ORDER — GABAPENTIN 300 MG PO CAPS
600.0000 mg | ORAL_CAPSULE | Freq: Three times a day (TID) | ORAL | Status: DC
  Administered 2014-08-28 – 2014-09-01 (×12): 600 mg via ORAL
  Filled 2014-08-28 (×8): qty 2
  Filled 2014-08-28: qty 1
  Filled 2014-08-28 (×4): qty 2

## 2014-08-28 MED ORDER — DEXTROSE 5 % IV SOLN
500.0000 mg | Freq: Once | INTRAVENOUS | Status: AC
  Administered 2014-08-28: 500 mg via INTRAVENOUS
  Filled 2014-08-28: qty 500

## 2014-08-28 MED ORDER — ROPINIROLE HCL 0.25 MG PO TABS
0.2500 mg | ORAL_TABLET | Freq: Every day | ORAL | Status: DC
  Administered 2014-08-28 – 2014-08-31 (×4): 0.25 mg via ORAL
  Filled 2014-08-28 (×5): qty 1

## 2014-08-28 MED ORDER — MIRABEGRON ER 50 MG PO TB24
50.0000 mg | ORAL_TABLET | Freq: Every day | ORAL | Status: DC
  Administered 2014-08-29 – 2014-09-01 (×4): 50 mg via ORAL
  Filled 2014-08-28 (×5): qty 1

## 2014-08-28 MED ORDER — IPRATROPIUM-ALBUTEROL 0.5-2.5 (3) MG/3ML IN SOLN
3.0000 mL | Freq: Once | RESPIRATORY_TRACT | Status: AC
  Administered 2014-08-28: 3 mL via RESPIRATORY_TRACT

## 2014-08-28 MED ORDER — MOMETASONE FURO-FORMOTEROL FUM 100-5 MCG/ACT IN AERO
2.0000 | INHALATION_SPRAY | Freq: Two times a day (BID) | RESPIRATORY_TRACT | Status: DC
  Administered 2014-08-28 – 2014-09-01 (×8): 2 via RESPIRATORY_TRACT
  Filled 2014-08-28: qty 8.8

## 2014-08-28 MED ORDER — IPRATROPIUM-ALBUTEROL 0.5-2.5 (3) MG/3ML IN SOLN
RESPIRATORY_TRACT | Status: AC
  Administered 2014-08-28: 3 mL via RESPIRATORY_TRACT
  Filled 2014-08-28: qty 3

## 2014-08-28 MED ORDER — PROMETHAZINE HCL 25 MG PO TABS
12.5000 mg | ORAL_TABLET | Freq: Three times a day (TID) | ORAL | Status: DC | PRN
  Administered 2014-08-28: 12.5 mg via ORAL
  Filled 2014-08-28: qty 1

## 2014-08-28 MED ORDER — MONTELUKAST SODIUM 10 MG PO TABS
10.0000 mg | ORAL_TABLET | Freq: Every day | ORAL | Status: DC
  Administered 2014-08-28 – 2014-08-31 (×4): 10 mg via ORAL
  Filled 2014-08-28 (×4): qty 1

## 2014-08-28 MED ORDER — SODIUM CHLORIDE 0.9 % IJ SOLN
3.0000 mL | Freq: Two times a day (BID) | INTRAMUSCULAR | Status: DC
  Administered 2014-08-28 – 2014-08-31 (×7): 3 mL via INTRAVENOUS

## 2014-08-28 MED ORDER — WARFARIN - PHARMACIST DOSING INPATIENT
Freq: Every day | Status: DC
  Administered 2014-08-29 – 2014-09-01 (×4)

## 2014-08-28 MED ORDER — AZITHROMYCIN 250 MG PO TABS
250.0000 mg | ORAL_TABLET | Freq: Every day | ORAL | Status: DC
  Administered 2014-08-29 – 2014-08-31 (×3): 250 mg via ORAL
  Filled 2014-08-28 (×3): qty 1

## 2014-08-28 MED ORDER — PRAVASTATIN SODIUM 20 MG PO TABS
40.0000 mg | ORAL_TABLET | Freq: Every day | ORAL | Status: DC
  Administered 2014-08-28 – 2014-08-31 (×4): 40 mg via ORAL
  Filled 2014-08-28 (×4): qty 2

## 2014-08-28 MED ORDER — INSULIN ASPART 100 UNIT/ML ~~LOC~~ SOLN
0.0000 [IU] | Freq: Every day | SUBCUTANEOUS | Status: DC
  Administered 2014-08-28 – 2014-08-29 (×2): 2 [IU] via SUBCUTANEOUS
  Administered 2014-08-30: 3 [IU] via SUBCUTANEOUS
  Filled 2014-08-28 (×2): qty 2
  Filled 2014-08-28: qty 3

## 2014-08-28 MED ORDER — ALPRAZOLAM 0.25 MG PO TABS
0.2500 mg | ORAL_TABLET | Freq: Every evening | ORAL | Status: DC
  Administered 2014-08-28 – 2014-09-01 (×5): 0.25 mg via ORAL
  Filled 2014-08-28 (×5): qty 1

## 2014-08-28 MED ORDER — ZINC OXIDE 20 % EX OINT
1.0000 "application " | TOPICAL_OINTMENT | Freq: Two times a day (BID) | CUTANEOUS | Status: DC
  Administered 2014-08-28 – 2014-09-01 (×8): 1 via TOPICAL
  Filled 2014-08-28 (×2): qty 28.35

## 2014-08-28 MED ORDER — CARVEDILOL 6.25 MG PO TABS
3.1250 mg | ORAL_TABLET | Freq: Two times a day (BID) | ORAL | Status: DC
  Administered 2014-08-28 – 2014-08-31 (×7): 3.125 mg via ORAL
  Filled 2014-08-28 (×8): qty 1

## 2014-08-28 MED ORDER — WARFARIN SODIUM 5 MG PO TABS: 5.0000 mg | ORAL_TABLET | Freq: Every day | ORAL | Status: DC

## 2014-08-28 MED ORDER — ACETAMINOPHEN 325 MG PO TABS
650.0000 mg | ORAL_TABLET | Freq: Four times a day (QID) | ORAL | Status: DC | PRN
  Administered 2014-08-28 – 2014-09-01 (×12): 650 mg via ORAL
  Filled 2014-08-28 (×13): qty 2

## 2014-08-28 MED ORDER — NITROFURANTOIN MACROCRYSTAL 50 MG PO CAPS
50.0000 mg | ORAL_CAPSULE | Freq: Every day | ORAL | Status: DC
  Administered 2014-08-28: 50 mg via ORAL
  Filled 2014-08-28 (×2): qty 1

## 2014-08-28 MED ORDER — METHYLPREDNISOLONE SODIUM SUCC 125 MG IJ SOLR
125.0000 mg | Freq: Once | INTRAMUSCULAR | Status: AC
  Administered 2014-08-28: 125 mg via INTRAVENOUS
  Filled 2014-08-28: qty 2

## 2014-08-28 MED ORDER — IPRATROPIUM-ALBUTEROL 0.5-2.5 (3) MG/3ML IN SOLN: 3.0000 mL | RESPIRATORY_TRACT | Status: DC | PRN

## 2014-08-28 MED ORDER — FUROSEMIDE 40 MG PO TABS
40.0000 mg | ORAL_TABLET | Freq: Two times a day (BID) | ORAL | Status: DC
  Administered 2014-08-29 – 2014-09-01 (×8): 40 mg via ORAL
  Filled 2014-08-28 (×8): qty 1

## 2014-08-28 NOTE — ED Notes (Signed)
O2 reduced to 2L via [place].

## 2014-08-28 NOTE — H&P (Addendum)
[facility] [place] [facility]   PATIENT NAME: Mindy Cook    MR#:  [id number]  DATE OF BIRTH:  03/11/1933  DATE OF ADMISSION:  08/28/2014  PRIMARY CARE PHYSICIAN: Patrice Paradise, MD   REQUESTING/REFERRING PHYSICIAN: Glenetta Hew, MD  CHIEF COMPLAINT:   Chief Complaint  Patient presents with  . Fall    HISTORY OF PRESENT ILLNESS:  Mindy Cook  is a 81 y.o. female who presents with hypoxia and fall at home. Patient is on 2 L of oxygen via nasal cannula at home chronically. Her husband has noticed over the past couple days that she has begun to experience desaturation events down to the 70s with her home O2 monitor. She also has been dropping things at home, and this morning she had a fall at home. The patient is unable to relate exactly what happened during the fall, but she states firmly that she did not lose consciousness. She came to her regular scheduled appointment at her outpatient physician's office and was found to be hypoxic there is well, with O2 sats in the high 60s. They sent her to the ED where she was found to be hypoxic in the 70s on 2 L of oxygen via nasal cannula. Her O2 sats improved significantly with administration of 1 DuoNeb. Patient's sats well when she is laying in bed, but tends to desat when she gets up and about. Evaluation in the ED largely benign, she did have an elevated BNP at 349 this is only mild elevation in her chest x-ray showed a right hilar opacity, which was not read by radiologist is significant for pneumonia and it did not show any significant pulmonary edema. Hospitalists were called for admission for acute on chronic respiratory failure due to likely COPD exacerbation.  PAST MEDICAL HISTORY:   Past Medical History  Diagnosis Date  . Asthma   . A-fib   . Arthritis   . Aortic stenosis   . Aortic insufficiency   . Hyperlipidemia   . Hypertension   . Sleep apnea   . CHF (congestive heart failure)   . History of  DVT (deep vein thrombosis)   . Diabetes mellitus type 2, uncontrolled   . COPD (chronic obstructive pulmonary disease)   . Cervical spondylolysis   . DDD (degenerative disc disease), cervical   . Anemia   . Mitral valve disorder     PAST SURGICAL HISTORY:   Past Surgical History  Procedure Laterality Date  . Abdominal hysterectomy    . Cardiac catheterization    . Colonoscopy      SOCIAL HISTORY:   Social History  Substance Use Topics  . Smoking status: Never Smoker   . Smokeless tobacco: Not on file  . Alcohol Use: No    FAMILY HISTORY:   Family History  Problem Relation Age of Onset  . Heart failure Mother   . Cancer Father   . Heart disease Father   . Breast cancer Sister   . Fibromyalgia Sister     DRUG ALLERGIES:   Allergies  Allergen Reactions  . Hydrocodone-Acetaminophen Nausea Only    MEDICATIONS AT HOME:   Prior to Admission medications   Medication Sig Start Date End Date Taking? Authorizing Provider  ALPRAZolam (XANAX) 0.25 MG tablet Take 0.25 mg by mouth every evening.   Yes Historical Provider, MD  azelastine (ASTELIN) 0.1 % nasal spray Place 1 spray into both nostrils 2 (two) times daily.   Yes Historical Provider, MD  carvedilol (  COREG) 3.125 MG tablet Take 3.125 mg by mouth 2 (two) times daily.   Yes Historical Provider, MD  clotrimazole (LOTRIMIN) 1 % cream Apply 1 application topically 2 (two) times daily. Pt mixes with zinc oxide cream.   Yes Historical Provider, MD  Cranberry 500 MG CAPS Take 500 mg by mouth daily.   Yes Historical Provider, MD  cyclobenzaprine (FLEXERIL) 5 MG tablet Take 5 mg by mouth 3 (three) times daily as needed for muscle spasms.   Yes Historical Provider, MD  diclofenac sodium (VOLTAREN) 1 % GEL Apply 2 g topically 2 (two) times daily.   Yes Historical Provider, MD  ferrous sulfate 325 (65 FE) MG tablet Take 325 mg by mouth daily.   Yes Historical Provider, MD  fluticasone (FLONASE) 50 MCG/ACT nasal spray Place 2  sprays into both nostrils daily as needed for allergies or rhinitis.   Yes Historical Provider, MD  Fluticasone-Salmeterol (ADVAIR) 250-50 MCG/DOSE AEPB Inhale 1 puff into the lungs 2 (two) times daily.   Yes Historical Provider, MD  furosemide (LASIX) 40 MG tablet Take 40 mg by mouth 2 (two) times daily.   Yes Historical Provider, MD  gabapentin (NEURONTIN) 300 MG capsule Take 600 mg by mouth 3 (three) times daily. Pt is able to take one extra capsule at bedtime.   Yes Historical Provider, MD  glimepiride (AMARYL) 1 MG tablet Take 1 mg by mouth 2 (two) times daily.   Yes Historical Provider, MD  meclizine (ANTIVERT) 12.5 MG tablet Take 12.5 mg by mouth 3 (three) times daily as needed for dizziness.   Yes Historical Provider, MD  metolazone (ZAROXOLYN) 5 MG tablet Take 5 mg by mouth 3 (three) times a week. Pt takes on Monday, Thursday, and Sunday.   Yes Historical Provider, MD  mirabegron ER (MYRBETRIQ) 50 MG TB24 tablet Take 1 tablet (50 mg total) by mouth daily. 07/22/14  Yes Crist Fat, MD  mometasone (NASONEX) 50 MCG/ACT nasal spray Place 2 sprays into the nose daily.   Yes Historical Provider, MD  montelukast (SINGULAIR) 10 MG tablet Take 10 mg by mouth daily.   Yes Historical Provider, MD  nitrofurantoin (MACRODANTIN) 50 MG capsule Take 1 capsule (50 mg total) by mouth at bedtime. 07/22/14  Yes Crist Fat, MD  potassium chloride SA (K-DUR,KLOR-CON) 20 MEQ tablet Take 20 mEq by mouth 2 (two) times daily.   Yes Historical Provider, MD  pravastatin (PRAVACHOL) 40 MG tablet Take 40 mg by mouth at bedtime.   Yes Historical Provider, MD  promethazine (PHENERGAN) 12.5 MG tablet Take 12.5 mg by mouth every 8 (eight) hours as needed for nausea or vomiting.   Yes Historical Provider, MD  rOPINIRole (REQUIP) 0.25 MG tablet Take 0.25 mg by mouth at bedtime.   Yes Historical Provider, MD  warfarin (COUMADIN) 5 MG tablet Take 5 mg by mouth daily.   Yes Historical Provider, MD  ZINC OXIDE,  TOPICAL, 10 % CREA Apply 1 application topically 2 (two) times daily. Pt mixes with Clotrimazole cream.   Yes Historical Provider, MD    REVIEW OF SYSTEMS:  Review of Systems  Constitutional: Negative for fever, chills, weight loss and malaise/fatigue.  HENT: Negative for ear pain, hearing loss and tinnitus.   Eyes: Negative for blurred vision, double vision, pain and redness.  Respiratory: Positive for cough and shortness of breath. Negative for hemoptysis, sputum production and wheezing.   Cardiovascular: Negative for chest pain, palpitations, orthopnea and leg swelling.  Gastrointestinal: Negative for nausea, vomiting, abdominal  pain, diarrhea and constipation.  Genitourinary: Negative for dysuria, frequency and hematuria.  Musculoskeletal: Positive for falls. Negative for back pain, joint pain and neck pain.  Skin:       No acne, rash, or lesions  Neurological: Negative for dizziness, tremors, focal weakness and weakness.  Endo/Heme/Allergies: Negative for polydipsia. Does not bruise/bleed easily.  Psychiatric/Behavioral: Negative for depression. The patient is not nervous/anxious and does not have insomnia.      VITAL SIGNS:   Filed Vitals:   08/28/14 1606 08/28/14 1652 08/28/14 1859 08/28/14 2048  BP: 118/86 133/57 123/70 145/65  Pulse: 76 79 82 80  Temp:      TempSrc:      Resp: 16 18 18 20   Height:      Weight:      SpO2: 100% 100% 99% 96%   Wt Readings from Last 3 Encounters:  08/28/14 83.915 kg (185 lb)  08/16/14 85.73 kg (189 lb)  07/25/14 83.915 kg (185 lb)    PHYSICAL EXAMINATION:  Physical Exam  Vitals reviewed. Constitutional: She is oriented to person, place, and time. She appears well-developed and well-nourished. No distress.  HENT:  Head: Normocephalic and atraumatic.  Mouth/Throat: Oropharynx is clear and moist.  Eyes: Conjunctivae and EOM are normal. Pupils are equal, round, and reactive to light. No scleral icterus.  Neck: Normal range of motion.  Neck supple. No JVD present. No thyromegaly present.  Cardiovascular: Normal rate, regular rhythm and intact distal pulses.  Exam reveals no gallop and no friction rub.   No murmur heard. Respiratory: Effort normal and breath sounds normal. No respiratory distress. She has no wheezes. She has no rales.  Mild coarse breath sounds worse on the right  GI: Soft. Bowel sounds are normal. She exhibits no distension. There is no tenderness.  Musculoskeletal: Normal range of motion. She exhibits no edema.  No arthritis, no gout  Lymphadenopathy:    She has no cervical adenopathy.  Neurological: She is alert and oriented to person, place, and time. No cranial nerve deficit.  No dysarthria, no aphasia  Skin: Skin is warm and dry. No rash noted. No erythema.  Psychiatric: She has a normal mood and affect. Her behavior is normal. Judgment and thought content normal.    LABORATORY PANEL:   CBC  Recent Labs Lab 08/28/14 1711  WBC 8.0  HGB 9.2*  HCT 28.8*  PLT 142*   ------------------------------------------------------------------------------------------------------------------  Chemistries   Recent Labs Lab 08/28/14 1711  NA 142  K 4.2  CL 91*  CO2 45*  GLUCOSE 137*  BUN 33*  CREATININE 1.11*  CALCIUM 9.7  AST 18  ALT 16  ALKPHOS 61  BILITOT 0.4   ------------------------------------------------------------------------------------------------------------------  Cardiac Enzymes  Recent Labs Lab 08/28/14 1711  TROPONINI 0.03   ------------------------------------------------------------------------------------------------------------------  RADIOLOGY:  Dg Chest 2 View  08/28/2014   CLINICAL DATA:  Larey Seat today. Hit left side of face appearing history of the asthma, atrial fibrillation, CHF and COPD. Patient is oxygen dependent.  EXAM: CHEST  2 VIEW  COMPARISON:  01/13/2012  FINDINGS: The heart is mildly enlarged but stable. Stable prominent mediastinal and hilar contours  with tortuosity, ectasia and calcification of the thoracic aorta. There are stable chronic bronchitic type lung changes and areas of pulmonary scarring. Stable eventration of the right hemidiaphragm. The bony thorax is grossly intact.  IMPRESSION: Stable cardiac enlargement and prominent mediastinal and hilar contours.  Chronic lung changes without definite acute overlying pulmonary process.   Electronically Signed   By: Demetrius Charity.  Gallerani M.D.   On: 08/28/2014 16:45   Ct Head Wo Contrast  08/28/2014   CLINICAL DATA:  Fall, head injury, on Coumadin  EXAM: CT HEAD WITHOUT CONTRAST  TECHNIQUE: Contiguous axial images were obtained from the base of the skull through the vertex without intravenous contrast.  COMPARISON:  None.  FINDINGS: No skull fracture is noted. Atherosclerotic calcifications of carotid siphon. No intracranial hemorrhage, mass effect or midline shift.  No acute cortical infarction. Moderate cerebral atrophy. No mass lesion is noted on this unenhanced scan. The gray and white-matter differentiation is preserved.  IMPRESSION: No acute intracranial abnormality.  Moderate cerebral atrophy.   Electronically Signed   By: Natasha Mead M.D.   On: 08/28/2014 16:47    EKG:   Orders placed or performed during the hospital encounter of 08/28/14  . ED EKG  . ED EKG    IMPRESSION AND PLAN:  Principal Problem:   Acute on chronic respiratory failure - patient is on chronic oxygen at home, and has not required increase her oxygen here so long as she is resting and lying still. She does desat if she moves around. We'll continue oxygen while here and treat her other problems as below assistance likely due to her COPD exacerbation and poor respiratory reserve. Active Problems:   COPD exacerbation - steroids and DuoNeb's in the ED, azithromycin initiated as well, we will continue these measures well here as well as her home inhalers. We'll keep her on pulse oximetry and monitor her closely overnight.   Fall -  unclear cause at this time, though desaturation events are likely contributory. Will treat for COPD as above, and monitor her closely tonight.   A-fib - patient is currently rate controlled, we'll continue her rate controlling medication from home. We will also get an echocardiogram to evaluate her A. fib and heart failure as possible contributory events to her desaturation events and her fall.   Benign essential HTN - continue home medications, currently controlled   Diabetes mellitus, type 2 - sliding scale insulin with appropriate fingerstick glucose checks, heart healthy/carb modified diet   HLD (hyperlipidemia) - continue home dose statin   Mixed incontinence - continue home medications   H/O aortic valve replacement - patient is on Coumadin for this, INR is subtherapeutic, will consult pharmacy to assist with Coumadin dosing.  All the records are reviewed and case discussed with ED provider. Management plans discussed with the patient and/or family.  DVT PROPHYLAXIS: Systemic anticoagulation  ADMISSION STATUS: Observation  CODE STATUS: Full  TOTAL TIME TAKING CARE OF THIS PATIENT: 45 minutes.    Eulia Hatcher FIELDING 08/28/2014, 9:06 PM  [facility]  Office  [phone]  CC: Primary care physician; Patrice Paradise, MD

## 2014-08-28 NOTE — Progress Notes (Signed)
ANTICOAGULATION CONSULT NOTE - Initial Consult  Pharmacy Consult for warfarin Indication: atrial fibrillation  Allergies  Allergen Reactions  . Hydrocodone-Acetaminophen Nausea Only    Patient Measurements: Heigh [id number]$  (157.5 cm) Weight: 185 lb (83.915 kg) IBW/kg (Calculated) : 50.1 Heparin Dosing Weight:   Vital Signs: Temp: 98 F (36.7 C) (08/18 1512) Temp Source: Oral (08/18 1512) BP: 145/65 mmHg (08/18 2048) Pulse Rate: 80 (08/18 2048)  Labs:  Recent Labs  08/28/14 1711  HGB 9.2*  HCT 28.8*  PLT 142*  LABPROT 20.5*  INR 1.74  CREATININE 1.11*  TROPONINI 0.03    Estimated Creatinine Clearance: 41.3 mL/min (by C-G formula based on Cr of 1.11).   Medical History: Past Medical History  Diagnosis Date  . Asthma   . A-fib   . Arthritis   . Aortic stenosis   . Aortic insufficiency   . Hyperlipidemia   . Hypertension   . Sleep apnea   . CHF (congestive heart failure)   . History of DVT (deep vein thrombosis)   . Diabetes mellitus type 2, uncontrolled   . COPD (chronic obstructive pulmonary disease)   . Cervical spondylolysis   . DDD (degenerative disc disease), cervical   . Anemia   . Mitral valve disorder     Medications:  Infusions:    Assessment: 81 yof cc fall at home. Desaturating on home oxygen per husband, into 43s. Likely AECOPD, started azithromycin.   Goal of Therapy:  INR 2-3 Monitor platelets by anticoagulation protocol: Yes   Plan:  INR 1.74 this evening. Will give a dose of 5 mg tonight and start daily INR monitoring on 08/30/14. Drug-drug interaction with azithromycin expected to increase INR so I will not increase dose at this point.  Carola Frost, Pharm.D. Clinical Pharmacist 08/28/2014,10:25 PM

## 2014-08-28 NOTE — ED Notes (Signed)
Neck, face and left ear abrasiona cleaned and dressed with tegaderm

## 2014-08-28 NOTE — ED Provider Notes (Signed)
[facility] Emergency Department Provider Note ____________________________________________  Time seen: Approximately 4:15 PM  I have reviewed the triage vital signs and the nursing notes.   HISTORY  Chief Complaint Fall  History provided by patient and her son, Jorja Loa  HPI Mindy Cook is a 81 y.o. female who presents from clinic after she was noted to be hypoxic 69% which they were able to get up to 80% using O2 via nasal cannula.  She had her regular appointment and was also planning to be evaluated after she tripped at home landing with her face on her walker around 9 AM today. She is on Coumadin and struck her face sustaining ecchymosis under her left eye and superficial skin tear of her left anterior neck. She did not lose consciousness and denies any headache.   She denies any recent fever, cough, chest pain, perception of shortness of breath; however she was noted to be hypoxic by her husband to 73% at night. She is oxygen dependent and wears 2 L all the time. Last night patient was noted by her husband to be dropping things.  Baseline ambulates with a walker  Past Medical History  Diagnosis Date  . Asthma   . A-fib   . Arthritis   . Urinary tract infection   . Aortic stenosis   . Aortic insufficiency   . Hyperlipidemia   . Hypertension   . Sleep apnea   . CHF (congestive heart failure)   . History of DVT (deep vein thrombosis)   . Diabetes mellitus type 2, uncontrolled     Patient Active Problem List   Diagnosis Date Noted  . A-fib 07/21/2014  . CAFL (chronic airflow limitation) 07/21/2014  . H/O deep venous thrombosis 07/21/2014  . HLD (hyperlipidemia) 07/21/2014  . BP (high blood pressure) 07/21/2014  . Apnea, sleep 07/21/2014  . Complete rotator cuff rupture of left shoulder 07/06/2014  . Cervico-occipital neuralgia 07/15/2013  . Cervical osteoarthritis 06/28/2013  . DDD (degenerative disc disease), cervical 06/28/2013  . Muscle  spasms of neck 06/28/2013  . Renal colic 06/12/2013  . Neuropathy 05/23/2013  . History of open heart surgery 05/07/2013  . Arthritis 04/23/2013  . Acquired cyst of kidney 11/20/2012  . Hypercapnemia 06/12/2012  . H/O aortic valve replacement 06/12/2012  . Congestive heart failure 04/26/2012  . Heart failure 04/26/2012  . Absolute anemia 02/08/2012  . Aortic valve defect 02/08/2012  . Benign essential HTN 02/08/2012  . Bladder infection, chronic 02/08/2012  . Calculus of gallbladder 02/08/2012  . Calculus of kidney 02/08/2012  . Biliary calculi 02/08/2012  . Dermatophytosis of scalp and beard 02/08/2012  . Incomplete bladder emptying 02/08/2012  . Disorder of mitral valve 02/08/2012  . Mixed incontinence 02/08/2012  . Diabetes mellitus, type 2 02/08/2012    Past Surgical History  Procedure Laterality Date  . Abdominal hysterectomy    . Cardiac catheterization    . Colonoscopy      Current Outpatient Rx  Name  Route  Sig  Dispense  Refill  . ALPRAZolam (XANAX) 0.25 MG tablet   Oral   Take 0.25 mg by mouth every evening.         Marland Kitchen azelastine (ASTELIN) 0.1 % nasal spray   Each Nare   Place 1 spray into both nostrils 2 (two) times daily.         . carvedilol (COREG) 3.125 MG tablet   Oral   Take 3.125 mg by mouth 2 (two) times daily.         Marland Kitchen  clotrimazole (LOTRIMIN) 1 % cream   Topical   Apply 1 application topically 2 (two) times daily. Pt mixes with zinc oxide cream.         . Cranberry 500 MG CAPS   Oral   Take 500 mg by mouth daily.         . cyclobenzaprine (FLEXERIL) 5 MG tablet   Oral   Take 5 mg by mouth 3 (three) times daily as needed for muscle spasms.         . diclofenac sodium (VOLTAREN) 1 % GEL   Topical   Apply 2 g topically 2 (two) times daily.         . ferrous sulfate 325 (65 FE) MG tablet   Oral   Take 325 mg by mouth daily.         . fluticasone (FLONASE) 50 MCG/ACT nasal spray   Each Nare   Place 2 sprays into both  nostrils daily as needed for allergies or rhinitis.         . Fluticasone-Salmeterol (ADVAIR) 250-50 MCG/DOSE AEPB   Inhalation   Inhale 1 puff into the lungs 2 (two) times daily.         . furosemide (LASIX) 40 MG tablet   Oral   Take 40 mg by mouth 2 (two) times daily.         Marland Kitchen gabapentin (NEURONTIN) 300 MG capsule   Oral   Take 600 mg by mouth 3 (three) times daily. Pt is able to take one extra capsule at bedtime.         Marland Kitchen glimepiride (AMARYL) 1 MG tablet   Oral   Take 1 mg by mouth 2 (two) times daily.         . meclizine (ANTIVERT) 12.5 MG tablet   Oral   Take 12.5 mg by mouth 3 (three) times daily as needed for dizziness.         . metolazone (ZAROXOLYN) 5 MG tablet   Oral   Take 5 mg by mouth 3 (three) times a week. Pt takes on Monday, Thursday, and Sunday.         . mirabegron ER (MYRBETRIQ) 50 MG TB24 tablet   Oral   Take 1 tablet (50 mg total) by mouth daily.   30 tablet   2   . mometasone (NASONEX) 50 MCG/ACT nasal spray   Nasal   Place 2 sprays into the nose daily.         . montelukast (SINGULAIR) 10 MG tablet   Oral   Take 10 mg by mouth daily.         . nitrofurantoin (MACRODANTIN) 50 MG capsule   Oral   Take 1 capsule (50 mg total) by mouth at bedtime.   90 capsule   0   . potassium chloride SA (K-DUR,KLOR-CON) 20 MEQ tablet   Oral   Take 20 mEq by mouth 2 (two) times daily.         . pravastatin (PRAVACHOL) 40 MG tablet   Oral   Take 40 mg by mouth at bedtime.         . promethazine (PHENERGAN) 12.5 MG tablet   Oral   Take 12.5 mg by mouth every 8 (eight) hours as needed for nausea or vomiting.         Marland Kitchen rOPINIRole (REQUIP) 0.25 MG tablet   Oral   Take 0.25 mg by mouth at bedtime.         Marland Kitchen  warfarin (COUMADIN) 5 MG tablet   Oral   Take 5 mg by mouth daily.         Marland Kitchen ZINC OXIDE, TOPICAL, 10 % CREA   Apply externally   Apply 1 application topically 2 (two) times daily. Pt mixes with Clotrimazole cream.          . diazepam (VALIUM) 5 MG tablet   Oral   Take 1 tablet (5 mg total) by mouth every 8 (eight) hours as needed for muscle spasms. Patient not taking: Reported on 08/28/2014   20 tablet   0   . HYDROcodone-acetaminophen (NORCO) 5-325 MG per tablet   Oral   Take 1 tablet by mouth every 6 (six) hours as needed for moderate pain. Patient not taking: Reported on 08/28/2014   15 tablet   0   . ondansetron (ZOFRAN) 4 MG tablet   Oral   Take 1 tablet (4 mg total) by mouth every 8 (eight) hours as needed for nausea or vomiting. Patient not taking: Reported on 08/28/2014   15 tablet   0   . oxyCODONE-acetaminophen (ROXICET) 5-325 MG per tablet   Oral   Take 1 tablet by mouth every 6 (six) hours as needed. Patient not taking: Reported on 08/28/2014   20 tablet   0   . potassium chloride (K-DUR) 10 MEQ tablet   Oral   Take 1 tablet (10 mEq total) by mouth daily. Patient not taking: Reported on 08/28/2014   7 tablet   0     Allergies Hydrocodone-acetaminophen  Family History  Problem Relation Age of Onset  . Heart failure Mother   . Cancer Father   . Heart disease Father   . Breast cancer Sister   . Fibromyalgia Sister     Social History Social History  Substance Use Topics  . Smoking status: Never Smoker   . Smokeless tobacco: None  . Alcohol Use: No    Review of Systems Constitutional: No fever Eyes: No visual changes. ENT: No URI Cardiovascular: Denies chest pain. Respiratory: Denies shortness of breath. Gastrointestinal: No abdominal pain.     Musculoskeletal: Negative for neck or back pain. Peripheral edema is at baseline Neurological: Negative for headaches, focal weakness or numbness. 10-point ROS otherwise negative.  ____________________________________________   PHYSICAL EXAM:  VITAL SIGNS: ED Triage Vitals  Enc Vitals Group     BP 08/28/14 1512 114/48 mmHg     Pulse Rate 08/28/14 1512 110     Resp 08/28/14 1512 22     Temp 08/28/14 1512 98  F (36.7 C)     Temp Source 08/28/14 1512 Oral     SpO2 08/28/14 1517 80 %     Weight 08/28/14 1512 185 lb (83.915 kg)     Height 08/28/14 1512 5\' 2"  (1.575 m)     Head Cir --      Peak Flow --      Pain Score 08/28/14 1513 8     Pain Loc --      Pain Edu? --      Excl. in GC? --    Constitutional: Alert and oriented. Well appearing and in no acute distress. Eyes: Conjunctivae are normal. PERRL. EOMI. Head: Ecchymosis under left eye; small skin tear left anterior neck Nose: No congestion/rhinnorhea. No blood in the nares Mouth/Throat: Mucous membranes are moist.  Oropharynx non-erythematous. Poor dentition; no dental trauma Neck: No stridor.  Nexus criteria negative; no cervical spine tenderness Lymphatic: No cervical lymphadenopathy. Cardiovascular: Normal rate,  regular rhythm. 2 Out of 6 systolic murmur.  Peripheral pulses 2+ B Respiratory: Normal respiratory effort.  No retractions. Mildly decreased breath sounds diffusely with coarse breath sounds bilateral bases Gastrointestinal: Soft and nontender. No distention. Normal bowel sounds.  Musculoskeletal: 1+ pitting edema bilaterally.  No calf TTP. Neurologic:  Normal speech and language. No gross focal neurologic deficits are appreciated. Speech is normal.  Skin:  Skin is warm, dry and intact. No rash noted. Chronic venous stasis changes bilateral lower extremities Psychiatric: Mood and affect are normal. Speech and behavior are normal.  ____________________________________________   LABS (all labs ordered are listed, but only abnormal results are displayed)  Labs Reviewed  COMPREHENSIVE METABOLIC PANEL - Abnormal; Notable for the following:    Chloride 91 (*)    CO2 45 (*)    Glucose, Bld 137 (*)    BUN 33 (*)    Creatinine, Ser 1.11 (*)    GFR calc non Af Amer 46 (*)    GFR calc Af Amer 53 (*)    All other components within normal limits  CBC WITH DIFFERENTIAL/PLATELET - Abnormal; Notable for the following:    RBC  3.10 (*)    Hemoglobin 9.2 (*)    HCT 28.8 (*)    MCHC 31.9 (*)    RDW 15.5 (*)    Platelets 142 (*)    All other components within normal limits  BRAIN NATRIURETIC PEPTIDE - Abnormal; Notable for the following:    B Natriuretic Peptide 349.0 (*)    All other components within normal limits  TROPONIN I  URINALYSIS COMPLETEWITH MICROSCOPIC ([facility] ONLY)  PROTIME-INR   ____________________________________________  EKG  ED ECG REPORT I, Maurilio Lovely, the attending physician, personally viewed and interpreted this ECG.   Date: 08/28/2014  EKG Time: 1549  Rate: 91  Rhythm: A. fib   Axis: nl  Intervals:nl  ST&T Change: T flat 3, aVF, V1 through 3  ____________________________________________  RADIOLOGY  CXR-IMPRESSION: Stable cardiac enlargement and prominent mediastinal and hilar contours.  Chronic lung changes without definite acute overlying pulmonary process.  CT head-IMPRESSION: No acute intracranial abnormality. Moderate cerebral atrophy.  ____________________________________________   PROCEDURES  Procedure(s) performed: none  Critical Care performed: none ____________________________________________   INITIAL IMPRESSION / ASSESSMENT AND PLAN / ED COURSE  Pertinent labs & imaging results that were available during my care of the patient were reviewed by me and considered in my medical decision making (see chart for details).  ----------------------------------------- 7:05 PM on 08/28/2014 -----------------------------------------  She lightheaded every time she sits and is therefore unable to attempt ambulation. After she ate ER sandwich tray her SaO2 drop to 85% on 2 L; she is back up to 99% on 3 L.  ----------------------------------------- 8:02 PM on 08/28/2014 -----------------------------------------  No significant change after DuoNeb. Son extremely concerned that hypoxic episodes unexplained and unsure what oxygen level she should be  on at home. He feels much more comfortable with admission for continued workup and treatment.  FINAL CLINICAL IMPRESSION(S) / ED DIAGNOSES  Final diagnoses:  Hypoxia  Fall against object, initial encounter      ____________________________________________   FINAL CLINICAL IMPRESSION(S) / ED DIAGNOSES Hypoxia & fall  Maurilio Lovely, MD 08/28/14 2042

## 2014-08-28 NOTE — ED Notes (Signed)
Pt presents with a fall today, states she tripped over her walker today. Pt did hit the left side of her face and is currently taking coumadin.

## 2014-08-28 NOTE — ED Notes (Signed)
O2 increased to 3L via [place] due to O2 sats at 85% at 2L. Patient had just finished eating ED sandwich tray.

## 2014-08-29 ENCOUNTER — Observation Stay
Admit: 2014-08-29 | Discharge: 2014-08-29 | Disposition: A | Discharge status: Home / Self Care | Payer: Medicare Other | Attending: Internal Medicine | Admitting: Internal Medicine

## 2014-08-29 LAB — CBC
HEMATOCRIT: 28.5 % — AB (ref 35.0–47.0)
HEMOGLOBIN: 9.4 g/dL — AB (ref 12.0–16.0)
MCH: 30.6 pg (ref 26.0–34.0)
MCHC: 33 g/dL (ref 32.0–36.0)
MCV: 92.8 fL (ref 80.0–100.0)
Platelets: 124 10*3/uL — ABNORMAL LOW (ref 150–440)
RBC: 3.07 MIL/uL — ABNORMAL LOW (ref 3.80–5.20)
RDW: 15.9 % — ABNORMAL HIGH (ref 11.5–14.5)
WBC: 7.5 10*3/uL (ref 3.6–11.0)

## 2014-08-29 LAB — GLUCOSE, CAPILLARY
GLUCOSE-CAPILLARY: 309 mg/dL — AB (ref 65–99)
GLUCOSE-CAPILLARY: 214 mg/dL — AB (ref 65–99)
Glucose-Capillary: 270 mg/dL — ABNORMAL HIGH (ref 65–99)
Glucose-Capillary: 307 mg/dL — ABNORMAL HIGH (ref 65–99)

## 2014-08-29 LAB — BASIC METABOLIC PANEL
ANION GAP: 6 (ref 5–15)
BUN: 33 mg/dL — ABNORMAL HIGH (ref 6–20)
CALCIUM: 9.4 mg/dL (ref 8.9–10.3)
CHLORIDE: 92 mmol/L — AB (ref 101–111)
CO2: 41 mmol/L — AB (ref 22–32)
Creatinine, Ser: 0.99 mg/dL (ref 0.44–1.00)
GFR calc Af Amer: >60 mL/min (ref >60)
GFR calc non Af Amer: 53 mL/min — ABNORMAL LOW (ref >60)
GLUCOSE: 264 mg/dL — AB (ref 65–99)
POTASSIUM: 5.2 mmol/L — AB (ref 3.5–5.1)
Sodium: 139 mmol/L (ref 135–145)

## 2014-08-29 LAB — HEMOGLOBIN A1C: Hgb A1c MFr Bld: 7.4 % — ABNORMAL HIGH (ref 4.0–6.0)

## 2014-08-29 MED ORDER — FLEET ENEMA 7-19 GM/118ML RE ENEM
1.0000 | ENEMA | Freq: Once | RECTAL | Status: AC
  Administered 2014-08-29: 1 via RECTAL

## 2014-08-29 MED ORDER — DEXTROSE 5 % IV SOLN
1.0000 g | INTRAVENOUS | Status: DC
  Administered 2014-08-29 – 2014-09-01 (×4): 1 g via INTRAVENOUS
  Filled 2014-08-29 (×5): qty 10

## 2014-08-29 MED ORDER — WARFARIN SODIUM 5 MG PO TABS
5.0000 mg | ORAL_TABLET | Freq: Every day | ORAL | Status: DC
  Administered 2014-08-29: 5 mg via ORAL
  Filled 2014-08-29: qty 1

## 2014-08-29 MED ORDER — DOCUSATE SODIUM 100 MG PO CAPS
100.0000 mg | ORAL_CAPSULE | Freq: Two times a day (BID) | ORAL | Status: DC
  Administered 2014-08-29 – 2014-09-01 (×7): 100 mg via ORAL
  Filled 2014-08-29 (×7): qty 1

## 2014-08-29 NOTE — Progress Notes (Signed)
ANTICOAGULATION CONSULT NOTE - FOLLOW UP   Pharmacy Consult for warfarin Indication: atrial fibrillation  Allergies  Allergen Reactions  . Hydrocodone-Acetaminophen Nausea Only    Patient Measurements: Height:  (157.5 cm) Weight: 192 lb 1.6 oz (87.136 kg) IBW/kg (Calculated) : 50.1 Heparin Dosing Weight:   Vital Signs: Temp: 97.8 F (36.6 C) (08/19 0741) Temp Source: Oral (08/19 0741) BP: 135/60 mmHg (08/19 0741) Pulse Rate: 88 (08/19 0815)  Labs:  Recent Labs  08/28/14 1711 08/29/14 0408  HGB 9.2* 9.4*  HCT 28.8* 28.5*  PLT 142* 124*  LABPROT 20.5*  --   INR 1.74  --   CREATININE 1.11* 0.99  TROPONINI 0.03  --     Estimated Creatinine Clearance: 47.2 mL/min (by C-G formula based on Cr of 0.99).   Medical History: Past Medical History  Diagnosis Date  . Asthma   . A-fib   . Arthritis   . Aortic stenosis   . Aortic insufficiency   . Hyperlipidemia   . Hypertension   . Sleep apnea   . CHF (congestive heart failure)   . History of DVT (deep vein thrombosis)   . Diabetes mellitus type 2, uncontrolled   . COPD (chronic obstructive pulmonary disease)   . Cervical spondylolysis   . DDD (degenerative disc disease), cervical   . Anemia   . Mitral valve disorder     Medications:  Infusions:    Assessment: 81 yof cc fall at home. Desaturating on home oxygen per husband, into 13s. Likely AECOPD, started azithromycin.   Goal of Therapy:  INR 2-3 Monitor platelets by anticoagulation protocol: Yes   Plan:  Patient's INR = 1.74. Patient did not receive dose at around midnight last night. Warfarin 5 mg PO q daily ordered.    Makenli Derstine D, Pharm.D. Clinical Pharmacist 08/29/2014,9:41 AM

## 2014-08-29 NOTE — Plan of Care (Signed)
Problem: Acute Rehab PT Goals(only PT should resolve) Goal: Pt Will Go Supine/Side To Sit Pt will demonstrate ModI bed mobility supine to sitting edge-of-bed to return to PLOF and to decrease caregiver burden.     Goal: Patient Will Transfer Sit To/From Stand Pt will transfer sit to/from-stand with RW at ModI without loss-of-balance to demonstrate good safety awareness for independent mobility in home.     Goal: Pt Will Ambulate Pt will ambulate with RW at Supervision using a step-through pattern and equal step length for a distances greater than 50ft to demonstrate the ability to perform safe household distance ambulation at discharge.        

## 2014-08-29 NOTE — Progress Notes (Signed)
*  PRELIMINARY RESULTS* Echocardiogram 2D Echocardiogram has been performed.  Mindy Cook 08/29/2014, 9:16 AM

## 2014-08-29 NOTE — Progress Notes (Signed)
[facility] -  at [facility]   PATIENT NAME: Mindy Cook    MR#:  [id number]  DATE OF BIRTH:  16-Jul-1933  SUBJECTIVE:  CHIEF COMPLAINT:   Chief Complaint  Patient presents with  . Fall   Patient presented to the hospital with a fall and also shortness of breath and noted to be in mild COPD exacerbation. Still feels quite weak. Denies a cough, chest pain, does admit to shortness of breath on exertion.  REVIEW OF SYSTEMS:    Review of Systems  Constitutional: Negative for fever and chills.  HENT: Negative for congestion and tinnitus.   Eyes: Negative for blurred vision and double vision.  Respiratory: Positive for cough and shortness of breath (on exertion). Negative for wheezing.   Cardiovascular: Negative for chest pain, orthopnea and PND.  Gastrointestinal: Negative for nausea, vomiting, abdominal pain and diarrhea.  Genitourinary: Negative for dysuria and hematuria.  Neurological: Positive for weakness (generalized). Negative for dizziness, sensory change and focal weakness.  All other systems reviewed and are negative.   Nutrition: Heart healthy Tolerating Diet: Yes Tolerating PT: Await evaluation  DRUG ALLERGIES:   Allergies  Allergen Reactions  . Hydrocodone-Acetaminophen Nausea Only    VITALS:  Blood pressure 135/60, pulse 88, temperature 97.8 F (36.6 C), temperature source Oral, resp. rate 16, height  (1.575 m), weight 87.136 kg (192 lb 1.6 oz), SpO2 97 %.  PHYSICAL EXAMINATION:   Physical Exam  GENERAL:  81 y.o.-year-old patient lying in the bed with no acute distress.  EYES: Pupils equal, round, reactive to light and accommodation. No scleral icterus. Extraocular muscles intact.  HEENT: Head atraumatic, normocephalic. Oropharynx and nasopharynx clear.  NECK:  Supple, no jugular venous distention. No thyroid enlargement, no tenderness.  LUNGS: Poor respiratory effort, no wheezing, rales, rhonchi. No use of accessory  muscles of respiration.  CARDIOVASCULAR: S1, S2 normal. 2/6 systolic ejection murmur at the right sternal border, rubs, or gallops.  ABDOMEN: Soft, nontender, nondistended. Bowel sounds present. No organomegaly or mass.  EXTREMITIES: No cyanosis, clubbing, +1 edema bilaterally.  NEUROLOGIC: Cranial nerves II through XII are intact. No focal Motor or sensory deficits b/l.  Globally weak PSYCHIATRIC: The patient is alert and oriented x 3. Good affect SKIN: No obvious rash, lesion, or ulcer.    LABORATORY PANEL:   CBC  Recent Labs Lab 08/29/14 0408  WBC 7.5  HGB 9.4*  HCT 28.5*  PLT 124*   ------------------------------------------------------------------------------------------------------------------  Chemistries   Recent Labs Lab 08/28/14 1711 08/29/14 0408  NA 142 139  K 4.2 5.2*  CL 91* 92*  CO2 45* 41*  GLUCOSE 137* 264*  BUN 33* 33*  CREATININE 1.11* 0.99  CALCIUM 9.7 9.4  AST 18  --   ALT 16  --   ALKPHOS 61  --   BILITOT 0.4  --    ------------------------------------------------------------------------------------------------------------------  Cardiac Enzymes  Recent Labs Lab 08/28/14 1711  TROPONINI 0.03   ------------------------------------------------------------------------------------------------------------------  RADIOLOGY:  Dg Chest 2 View  08/28/2014   CLINICAL DATA:  Larey Seat today. Hit left side of face appearing history of the asthma, atrial fibrillation, CHF and COPD. Patient is oxygen dependent.  EXAM: CHEST  2 VIEW  COMPARISON:  01/13/2012  FINDINGS: The heart is mildly enlarged but stable. Stable prominent mediastinal and hilar contours with tortuosity, ectasia and calcification of the thoracic aorta. There are stable chronic bronchitic type lung changes and areas of pulmonary scarring. Stable eventration of the right hemidiaphragm. The bony thorax is grossly intact.  IMPRESSION: Stable cardiac enlargement and prominent mediastinal and hilar  contours.  Chronic lung changes without definite acute overlying pulmonary process.   Electronically Signed   By: Rudie Meyer M.D.   On: 08/28/2014 16:45   Ct Head Wo Contrast  08/28/2014   CLINICAL DATA:  Fall, head injury, on Coumadin  EXAM: CT HEAD WITHOUT CONTRAST  TECHNIQUE: Contiguous axial images were obtained from the base of the skull through the vertex without intravenous contrast.  COMPARISON:  None.  FINDINGS: No skull fracture is noted. Atherosclerotic calcifications of carotid siphon. No intracranial hemorrhage, mass effect or midline shift.  No acute cortical infarction. Moderate cerebral atrophy. No mass lesion is noted on this unenhanced scan. The gray and white-matter differentiation is preserved.  IMPRESSION: No acute intracranial abnormality.  Moderate cerebral atrophy.   Electronically Signed   By: Natasha Mead M.D.   On: 08/28/2014 16:47     ASSESSMENT AND PLAN:   81 year old female with past medical history of diabetes, hypertension, anxiety, history of CHF, hyperlipidemia, obstructive sleep apnea, history of previous DVT, COPD, chronic anemia, who presented to the hospital with shortness of breath and a fall and noted to be in mild COPD exacerbation.  #1 generalized weakness/fall-this is probably related to patient's deconditioning, underlying UTI and mild COPD. -Continue IV antibiotics for treatment for UTI. We'll get a physical therapy consult to assess mobility.  #2 UTI-continue IV ceftriaxone follow urine cultures.  #3 COPD with mild acute exacerbation-continue Zithromax, Dulera, DuoNeb nebs as needed. -Patient is already on oxygen at home.  #4 history of previous DVT-continue Coumadin. INR is subtherapeutic on admission.  #5 restless leg syndrome-continue Requip.  #6 type 2 diabetes without complication-continue sliding scale insulin.  #7 hyperlipidemia-continue Pravachol.  #8 diabetic neuropathy-continue Neurontin.   All the records are reviewed and case  discussed with Care Management/Social Workerr. Management plans discussed with the patient, family and they are in agreement.  CODE STATUS: Full  DVT Prophylaxis: Warfarin  TOTAL TIME TAKING CARE OF THIS PATIENT: 30 minutes.   POSSIBLE D/C IN 2-3 DAYS, DEPENDING ON CLINICAL CONDITION.   Houston Siren M.D on 08/29/2014 at 1:54 PM  Between 7am to 6pm - Pager - [phone]  After 6pm go to [email or web address] - password [email or web address] [facility]  [place]  [facility]  Office  [phone]  CC: Primary care physician; Patrice Paradise, MD

## 2014-08-29 NOTE — Progress Notes (Signed)
Inpatient Diabetes Program Recommendations  AACE/ADA: New Consensus Statement on Inpatient Glycemic Control (2013)  Target Ranges:  Prepandial:   less than 140 mg/dL      Peak postprandial:   less than 180 mg/dL (1-2 hours)      Critically ill patients:  140 - 180 mg/dL   Reason for review: elevated blood sugars  Diabetes history: Type 2 Outpatient Diabetes medications: Amaryl  bid  Current orders for Inpatient glycemic control: Novolog correction 0-9 units tid with meals, Novolog 0-5 units qhs  If blood sugars remain elevated despite Novolog correction insulin, consider starting the patient on low dose basal insulin; Lantus 9 units qday (0.1unit/kg).  Mindy Racer, RN, BA, MHA, CDE Diabetes Coordinator Inpatient Diabetes Program  [phone] (Team Pager) [phone] [facility] Office) 08/29/2014 9:25 AM

## 2014-08-29 NOTE — Progress Notes (Signed)
Spoke to Dr. Allena Katz @ 2002 in regards to patient request for an enema.  Doctor ordered a fleet enema.  Arturo Morton  08/29/2014  8:07 PM

## 2014-08-29 NOTE — Progress Notes (Signed)
Notified Dr Cherlynn Kaiser of pt request for something to help her have a bowel movement; orders received

## 2014-08-29 NOTE — Evaluation (Signed)
Physical Therapy Evaluation Patient Details Name: Mindy Cook MRN: [id number] DOB: August 01, 1933 Today's Date: 08/29/2014   History of Present Illness  Pt is a 81yo white F c Hx: COPD on 2L at home, who reports gradual decline in energy level and increasing desaturation. Pt experienced a fall and hit head. Husband reports her O2sats to be in 59's. Pt arived at [facility] [place], found to be in likely COPD exacerbation and have a UTI.   Clinical Impression  Pt is received semirecumbent in bed upon entry, awake, alert, and reluctant but willing to participate. No acute distress noted, although pt reports that she feel very weak. Pt is A&Ox3 and pleasant. Pt reports two falls in the last 6 months. Pt strength as screened by MMT is 4+/5 throughout with mildly weak grip strength, but is generally weak in upper and lower quarters as noted during functional mobility assessment. Pt falls risk is high as evidenced by slow gait speed, poor forward reach, and altered posturing during transfers and mobility. Pt remaining on 3L O2 throughout evaluation, but desaturating with activity (95%) as well as mobility, whereas pt is on 2L O2 at baseline at home. Patient presenting with impairment of strength, range of motion, balance, oxygen perfusion, and activity tolerance, limiting ability to perform ADL and mobility tasks at  baseline level of function. Patient will benefit from skilled intervention to address the above impairments and limitations, in order to restore to prior level of function, improve patient safety upon discharge, and to decrease falls risk.       Follow Up Recommendations Home health PT    Equipment Recommendations  None recommended by PT    Recommendations for Other Services       Precautions / Restrictions Precautions Precautions: None Restrictions Weight Bearing Restrictions: No      Mobility  Bed Mobility Overal bed mobility: Needs Assistance Bed Mobility: Supine to Sit;Sit to Supine      Supine to sit: Min assist Sit to supine: Min assist      Transfers Overall transfer level: Needs assistance Equipment used: Rolling walker (2 wheeled) Transfers: Sit to/from Stand Sit to Stand: Min guard         General transfer comment: minimal use of BUE to come to standing, uses RW for stability and balance.   Ambulation/Gait Ambulation/Gait assistance: Min guard Ambulation Distance (Feet): 5 Feet Assistive device: Rolling walker (2 wheeled)     Gait velocity interpretation: <1.8 ft/sec, indicative of risk for recurrent falls General Gait Details: Very short choppy steps, several LOB backwards requiring PT to correct. Pt appears very weak as she reports.   Stairs            Wheelchair Mobility    Modified Rankin (Stroke Patients Only)       Balance Overall balance assessment: History of Falls (reports two falls in last several months; pt c multiple LOB during trial amb, requires PT to correct. )                                           Pertinent Vitals/Pain Pain Assessment: No/denies pain    Home Living Family/patient expects to be discharged to:: Private residence Living Arrangements: Spouse/significant other (Husband is on disability c limitations in mobility. ) Available Help at Discharge: Family (Son/daughter. ) Type of Home: House Home Access: Stairs to enter Entrance Stairs-Rails: None Entrance Stairs-Number of Steps:  1 Home Layout: One level        Prior Function Level of Independence: Independent with assistive device(s)         Comments: On 2L O2 at home, uses RW for primarily household distance ambulation. Unable to ambulate in communty due to poor activity tolerance.      Hand Dominance   Dominant Hand: Right    Extremity/Trunk Assessment   Upper Extremity Assessment: Generalized weakness (MMT screening reveals mild weakness (4+/5) throught bilat UE. )           Lower Extremity Assessment: Generalized  weakness (Labored effort and time to perform bed mobility and transfers, difficulty scooting in bed. )      Cervical / Trunk Assessment: Kyphotic  Communication   Communication: No difficulties  Cognition Arousal/Alertness: Awake/alert Behavior During Therapy: WFL for tasks assessed/performed Overall Cognitive Status: Within Functional Limits for tasks assessed                      General Comments      Exercises        Assessment/Plan    PT Assessment Patient needs continued PT services  PT Diagnosis Difficulty walking;Abnormality of gait;Generalized weakness   PT Problem List Decreased strength;Decreased activity tolerance;Decreased balance;Decreased mobility;Decreased coordination  PT Treatment Interventions DME instruction;Gait training;Stair training;Functional mobility training;Therapeutic activities;Therapeutic exercise;Balance training   PT Goals (Current goals can be found in the Care Plan section) Acute Rehab PT Goals Patient Stated Goal: Improve balance and activity tolerance for AMB in house.  PT Goal Formulation: With patient Time For Goal Achievement: 09/12/14 Potential to Achieve Goals: Good    Frequency Min 2X/week   Barriers to discharge   Limited assistance from husband.     Co-evaluation               End of Session Equipment Utilized During Treatment: Gait belt Activity Tolerance: Patient tolerated treatment well;Patient limited by fatigue Patient left: in bed;with call bell/phone within reach;with bed alarm set Nurse Communication: Mobility status;Other (comment)    Functional Limitation: Mobility: Walking and moving around Mobility: Walking and Moving Around Current Status [phone]): At least 60 percent but less than 80 percent impaired, limited or restricted Mobility: Walking and Moving Around Goal Status [phone]): At least 40 percent but less than 60 percent impaired, limited or restricted    Time: 1517-1529 PT Time Calculation  (min) (ACUTE ONLY): 12 min   Charges:   PT Evaluation $Initial PT Evaluation Tier I: 1 Procedure     PT G Codes:   PT G-Codes **NOT FOR INPATIENT CLASS** Functional Limitation: Mobility: Walking and moving around Mobility: Walking and Moving Around Current Status ([id number]): At least 60 percent but less than 80 percent impaired, limited or restricted Mobility: Walking and Moving Around Goal Status [phone]): At least 40 percent but less than 60 percent impaired, limited or restricted    Buccola,Allan C 08/29/2014, 4:08 PM 4:10 PM  Rosamaria Lints, PT, DPT [place] License # [id number]

## 2014-08-30 LAB — BASIC METABOLIC PANEL
Anion gap: 9 (ref 5–15)
BUN: 39 mg/dL — AB (ref 6–20)
CALCIUM: 9.2 mg/dL (ref 8.9–10.3)
CHLORIDE: 88 mmol/L — AB (ref 101–111)
CO2: 43 mmol/L — AB (ref 22–32)
CREATININE: 1.11 mg/dL — AB (ref 0.44–1.00)
GFR calc non Af Amer: 46 mL/min — ABNORMAL LOW (ref >60)
GFR, EST AFRICAN AMERICAN: 53 mL/min — AB (ref >60)
Glucose, Bld: 127 mg/dL — ABNORMAL HIGH (ref 65–99)
Potassium: 3.8 mmol/L (ref 3.5–5.1)
Sodium: 140 mmol/L (ref 135–145)

## 2014-08-30 LAB — GLUCOSE, CAPILLARY
GLUCOSE-CAPILLARY: 179 mg/dL — AB (ref 65–99)
GLUCOSE-CAPILLARY: 222 mg/dL — AB (ref 65–99)
GLUCOSE-CAPILLARY: 263 mg/dL — AB (ref 65–99)
Glucose-Capillary: 166 mg/dL — ABNORMAL HIGH (ref 65–99)

## 2014-08-30 LAB — PROTIME-INR
INR: 2.2
Prothrombin Time: 24.6 seconds — ABNORMAL HIGH (ref 11.4–15.0)

## 2014-08-30 MED ORDER — SENNA 8.6 MG PO TABS: 1.0000 | ORAL_TABLET | Freq: Every evening | ORAL | Status: DC | PRN

## 2014-08-30 MED ORDER — FLEET ENEMA 7-19 GM/118ML RE ENEM
1.0000 | ENEMA | Freq: Once | RECTAL | Status: AC
  Administered 2014-08-30: 1 via RECTAL

## 2014-08-30 MED ORDER — WARFARIN SODIUM 4 MG PO TABS
4.0000 mg | ORAL_TABLET | Freq: Every day | ORAL | Status: DC
  Administered 2014-08-30 – 2014-09-01 (×3): 4 mg via ORAL
  Filled 2014-08-30 (×3): qty 1

## 2014-08-30 NOTE — Care Management Note (Signed)
Case Management Note  Patient Details  Name: NEYTIRI ASCHE MRN: [id number] Date of Birth: September 18, 1933  Subjective/Objective:   81yo Mrs Mindy Cook  Was admitted to an Observation bed after a fall at home and presented to the ED desating into the 70's upon exertion. Chronic 2L N/C. Hx of A-Fib and on coumadin at home where she resides with her husband Annette Stable. Anticipate home with home health RN and PT. Will speak with Ms Stemler and family tomorrow about any current or desired home health services, and choice of providers.                  Action/Plan:   Expected Discharge Date:                  Expected Discharge Plan:     In-House Referral:     Discharge planning Services     Post Acute Care Choice:    Choice offered to:     DME Arranged:    DME Agency:     HH Arranged:    HH Agency:     Status of Service:     Medicare Important Message Given:    Date Medicare IM Given:    Medicare IM give by:    Date Additional Medicare IM Given:    Additional Medicare Important Message give by:     If discussed at Long Length of Stay Meetings, dates discussed:    Additional Comments:  Ugo Thoma A, RN 08/30/2014, 4:45 PM

## 2014-08-30 NOTE — Progress Notes (Signed)
[facility] - [place] at [facility]   PATIENT NAME: Mindy Cook    MR#:  [id number]  DATE OF BIRTH:  17-Feb-1933  SUBJECTIVE:  CHIEF COMPLAINT:   Chief Complaint  Patient presents with  . Fall   Patient presented to the hospital with a fall and also shortness of breath and noted to be in mild COPD exacerbation. Still feels quite weak. Admits of some dry cough. Denies sputum ,  chest pain, does admit to shortness of breath on exertion. History of chronic atrial fibrillation.   REVIEW OF SYSTEMS:    Review of Systems  Constitutional: Negative for fever, chills and weight loss.  HENT: Negative for congestion and tinnitus.   Eyes: Negative for blurred vision and double vision.  Respiratory: Positive for cough and shortness of breath (on exertion). Negative for sputum production and wheezing.   Cardiovascular: Negative for chest pain, palpitations, orthopnea, leg swelling and PND.  Gastrointestinal: Negative for nausea, vomiting, abdominal pain, diarrhea, constipation and blood in stool.  Genitourinary: Negative for dysuria, urgency, frequency and hematuria.  Musculoskeletal: Negative for falls.  Neurological: Positive for weakness (generalized). Negative for dizziness, tremors, sensory change, focal weakness and headaches.  Endo/Heme/Allergies: Does not bruise/bleed easily.  Psychiatric/Behavioral: Negative for depression. The patient does not have insomnia.   All other systems reviewed and are negative.   Nutrition: Heart healthy Tolerating Diet: Yes Tolerating PT: Await evaluation  DRUG ALLERGIES:   Allergies  Allergen Reactions  . Hydrocodone-Acetaminophen Nausea Only    VITALS:  Blood pressure 131/55, pulse 82, temperature 98.2 F (36.8 C), temperature source Oral, resp. rate 18, height  (1.575 m), weight 87.454 kg (192 lb 12.8 oz), SpO2 95 %.  PHYSICAL EXAMINATION:   Physical Exam  GENERAL:  81 y.o.-year-old patient lying in the bed  with no acute distress.  EYES: Pupils equal, round, reactive to light and accommodation. No scleral icterus. Extraocular muscles intact.  HEENT: Head atraumatic, normocephalic. Oropharynx and nasopharynx clear.  NECK:  Supple, no jugular venous distention. No thyroid enlargement, no tenderness.  LUNGS: Poor respiratory effort, no wheezing, rales, rhonchi. No use of accessory muscles of respiration. Markedly diminished breath sounds bilaterally posteriorly as well as anteriorly CARDIOVASCULAR: S1, S2 normal. 2/6 systolic ejection murmur at the right sternal border, rubs, or gallops.  ABDOMEN: Soft, nontender, nondistended. Bowel sounds present. No organomegaly or mass.  EXTREMITIES: No cyanosis, clubbing, +1 edema bilaterally.  NEUROLOGIC: Cranial nerves II through XII are intact. No focal Motor or sensory deficits b/l.  Globally weak, has difficulty sitting up in bed PSYCHIATRIC: The patient is alert and oriented x 3. Good affect SKIN: No obvious rash, lesion, or ulcer.    LABORATORY PANEL:   CBC  Recent Labs Lab 08/29/14 0408  WBC 7.5  HGB 9.4*  HCT 28.5*  PLT 124*   ------------------------------------------------------------------------------------------------------------------  Chemistries   Recent Labs Lab 08/28/14 1711  08/30/14 0445  NA 142  < > 140  K 4.2  < > 3.8  CL 91*  < > 88*  CO2 45*  < > 43*  GLUCOSE 137*  < > 127*  BUN 33*  < > 39*  CREATININE 1.11*  < > 1.11*  CALCIUM 9.7  < > 9.2  AST 18  --   --   ALT 16  --   --   ALKPHOS 61  --   --   BILITOT 0.4  --   --   < > = values in this interval not  displayed. ------------------------------------------------------------------------------------------------------------------  Cardiac Enzymes  Recent Labs Lab 08/28/14 1711  TROPONINI 0.03   ------------------------------------------------------------------------------------------------------------------  RADIOLOGY:  Dg Chest 2 View  08/28/2014    CLINICAL DATA:  Larey Seat today. Hit left side of face appearing history of the asthma, atrial fibrillation, CHF and COPD. Patient is oxygen dependent.  EXAM: CHEST  2 VIEW  COMPARISON:  01/13/2012  FINDINGS: The heart is mildly enlarged but stable. Stable prominent mediastinal and hilar contours with tortuosity, ectasia and calcification of the thoracic aorta. There are stable chronic bronchitic type lung changes and areas of pulmonary scarring. Stable eventration of the right hemidiaphragm. The bony thorax is grossly intact.  IMPRESSION: Stable cardiac enlargement and prominent mediastinal and hilar contours.  Chronic lung changes without definite acute overlying pulmonary process.   Electronically Signed   By: Rudie Meyer M.D.   On: 08/28/2014 16:45   Ct Head Wo Contrast  08/28/2014   CLINICAL DATA:  Fall, head injury, on Coumadin  EXAM: CT HEAD WITHOUT CONTRAST  TECHNIQUE: Contiguous axial images were obtained from the base of the skull through the vertex without intravenous contrast.  COMPARISON:  None.  FINDINGS: No skull fracture is noted. Atherosclerotic calcifications of carotid siphon. No intracranial hemorrhage, mass effect or midline shift.  No acute cortical infarction. Moderate cerebral atrophy. No mass lesion is noted on this unenhanced scan. The gray and white-matter differentiation is preserved.  IMPRESSION: No acute intracranial abnormality.  Moderate cerebral atrophy.   Electronically Signed   By: Natasha Mead M.D.   On: 08/28/2014 16:47     ASSESSMENT AND PLAN:   81 year old female with past medical history of diabetes, hypertension, anxiety, history of CHF, hyperlipidemia, obstructive sleep apnea, history of previous DVT, COPD, chronic anemia, who presented to the hospital with shortness of breath and a fall and noted to be in mild COPD exacerbation. * Acute on chronic respiratory failure with hypoxia, likely due to mild COPD exacerbation, continue current management. Patient seemed to be  improving now. She is on 2 L of oxygen through nasal cannula which is her baseline  #1 generalized weakness/fall-this is probably related to patient's deconditioning, underlying UTI and mild COPD/hypoxia. -Continue IV antibiotics for treatment for UTI. We'll get a physical therapy consult to assess mobility. Home health was recommended by physical therapist, will continue physical therapy while patient is here and get her out of bed to the chair  #2 UTI-continue IV ceftriaxone , urine cultures not reported.  #3 COPD with mild acute exacerbation-continue Zithromax, Rocephin , Dulera, DuoNeb nebs as needed. -Patient is already on oxygen at home.Clinically improving, was on 3 L of oxygen on 2 L of oxygen through nasal cannula  #4 history of previous DVT-continue Coumadin. INR is subtherapeutic on admission, therapeutic today with INR of 2.2.  #5 restless leg syndrome-continue Requip.  #6 type 2 diabetes without complication-continue sliding scale insulin.  #7 hyperlipidemia-continue Pravachol.  #8 diabetic neuropathy-continue Neurontin. #9  Constipation. Initiate patient on Senokot and they give one more enema per her request  All the records are reviewed and case discussed with Care Management/Social Workerr. Management plans discussed with the patient, family and they are in agreement.  CODE STATUS: Full  DVT Prophylaxis: Warfarin  TOTAL TIME TAKING CARE OF THIS PATIENT: 30 minutes.   POSSIBLE D/C IN 2-3 DAYS, DEPENDING ON CLINICAL CONDITION.   Katharina Caper M.D on 08/30/2014 at 11:29 AM  Between 7am to 6pm - Pager - [phone]  After 6pm go to [email or web address] - password [email or web address]  [facility]  Office  [phone]  CC: Primary care physician; Patrice Paradise, MD

## 2014-08-30 NOTE — Progress Notes (Addendum)
ANTICOAGULATION CONSULT NOTE - FOLLOW UP   Pharmacy Consult for warfarin Indication: atrial fibrillation  Allergies  Allergen Reactions  . Hydrocodone-Acetaminophen Nausea Only    Patient Measurements: Height:  (157.5 cm) Weight: 192 lb 12.8 oz (87.454 kg) IBW/kg (Calculated) : 50.1 Heparin Dosing Weight:   Vital Signs: Temp: 98.2 F (36.8 C) (08/20 0744) Temp Source: Oral (08/20 0744) BP: 131/55 mmHg (08/20 0744) Pulse Rate: 82 (08/20 0744)  Labs:  Recent Labs  08/28/14 1711 08/29/14 0408 08/30/14 0445  HGB 9.2* 9.4*  --   HCT 28.8* 28.5*  --   PLT 142* 124*  --   LABPROT 20.5*  --  24.6*  INR 1.74  --  2.20  CREATININE 1.11* 0.99 1.11*  TROPONINI 0.03  --   --     Estimated Creatinine Clearance: 42.2 mL/min (by C-G formula based on Cr of 1.11).   Medical History: Past Medical History  Diagnosis Date  . Asthma   . A-fib   . Arthritis   . Aortic stenosis   . Aortic insufficiency   . Hyperlipidemia   . Hypertension   . Sleep apnea   . CHF (congestive heart failure)   . History of DVT (deep vein thrombosis)   . Diabetes mellitus type 2, uncontrolled   . COPD (chronic obstructive pulmonary disease)   . Cervical spondylolysis   . DDD (degenerative disc disease), cervical   . Anemia   . Mitral valve disorder     Medications:  Infusions:    Assessment: Phamacy consulted to dose warfarin for 81 yo female admitted with likely AECOPD. Patient has history significant for Afib and DVT. Goal INR 2-3. Patient is receiving ceftriaxone and azithromycin in house and had has been continued on ropinirole; all of which can interact with warfarin. Patient received home dose of warfarin on 8/19, but appears to not have received dose on 8/18.   Goal of Therapy:  INR 2-3 Monitor platelets by anticoagulation protocol: Yes   Plan:  Patient's INR in range goal range today. Will decrease warfarin to  daily while on ABX. Will obtain follow-up INR with am labs.     Pharmacy will continue to monitor and adjust per consult.    Audine Mangione L, Pharm.D. Clinical Pharmacist 08/30/2014,7:50 AM

## 2014-08-31 ENCOUNTER — Observation Stay: Payer: Medicare Other

## 2014-08-31 DIAGNOSIS — R531 Weakness: Secondary | ICD-10-CM

## 2014-08-31 DIAGNOSIS — J9621 Acute and chronic respiratory failure with hypoxia: Secondary | ICD-10-CM

## 2014-08-31 DIAGNOSIS — J209 Acute bronchitis, unspecified: Secondary | ICD-10-CM

## 2014-08-31 DIAGNOSIS — M25569 Pain in unspecified knee: Secondary | ICD-10-CM

## 2014-08-31 DIAGNOSIS — N390 Urinary tract infection, site not specified: Secondary | ICD-10-CM

## 2014-08-31 DIAGNOSIS — M109 Gout, unspecified: Secondary | ICD-10-CM

## 2014-08-31 LAB — CBC
HEMATOCRIT: 30.9 % — AB (ref 35.0–47.0)
Hemoglobin: 10 g/dL — ABNORMAL LOW (ref 12.0–16.0)
MCH: 29.7 pg (ref 26.0–34.0)
MCHC: 32.3 g/dL (ref 32.0–36.0)
MCV: 91.9 fL (ref 80.0–100.0)
PLATELETS: 140 10*3/uL — AB (ref 150–440)
RBC: 3.36 MIL/uL — ABNORMAL LOW (ref 3.80–5.20)
RDW: 15.5 % — AB (ref 11.5–14.5)
WBC: 8.6 10*3/uL (ref 3.6–11.0)

## 2014-08-31 LAB — GLUCOSE, CAPILLARY
GLUCOSE-CAPILLARY: 188 mg/dL — AB (ref 65–99)
GLUCOSE-CAPILLARY: 458 mg/dL — AB (ref 65–99)
GLUCOSE-CAPILLARY: 466 mg/dL — AB (ref 65–99)
Glucose-Capillary: 304 mg/dL — ABNORMAL HIGH (ref 65–99)
Glucose-Capillary: 359 mg/dL — ABNORMAL HIGH (ref 65–99)

## 2014-08-31 LAB — PROTIME-INR
INR: 2.12
PROTHROMBIN TIME: 23.9 s — AB (ref 11.4–15.0)

## 2014-08-31 MED ORDER — IPRATROPIUM-ALBUTEROL 0.5-2.5 (3) MG/3ML IN SOLN: 3.0000 mL | RESPIRATORY_TRACT | Status: AC | PRN

## 2014-08-31 MED ORDER — DOCUSATE SODIUM 100 MG PO CAPS: 100.0000 mg | ORAL_CAPSULE | Freq: Two times a day (BID) | ORAL | Status: DC

## 2014-08-31 MED ORDER — INSULIN ASPART 100 UNIT/ML ~~LOC~~ SOLN: 0.0000 [IU] | Freq: Three times a day (TID) | SUBCUTANEOUS | Status: DC

## 2014-08-31 MED ORDER — MINERAL OIL RE ENEM
1.0000 | ENEMA | Freq: Once | RECTAL | Status: AC
  Administered 2014-08-31: 1 via RECTAL

## 2014-08-31 MED ORDER — INSULIN ASPART 100 UNIT/ML ~~LOC~~ SOLN
9.0000 [IU] | Freq: Once | SUBCUTANEOUS | Status: AC
  Administered 2014-08-31: 9 [IU] via SUBCUTANEOUS
  Filled 2014-08-31: qty 9

## 2014-08-31 MED ORDER — TRAMADOL HCL 50 MG PO TABS
50.0000 mg | ORAL_TABLET | Freq: Four times a day (QID) | ORAL | Status: DC | PRN
  Administered 2014-08-31: 50 mg via ORAL
  Filled 2014-08-31: qty 1

## 2014-08-31 MED ORDER — METHYLPREDNISOLONE SODIUM SUCC 125 MG IJ SOLR
60.0000 mg | Freq: Four times a day (QID) | INTRAMUSCULAR | Status: DC
  Administered 2014-08-31 – 2014-09-01 (×5): 60 mg via INTRAVENOUS
  Filled 2014-08-31 (×5): qty 2

## 2014-08-31 MED ORDER — METHYLPREDNISOLONE 4 MG PO TBPK: ORAL_TABLET | ORAL | Status: DC

## 2014-08-31 MED ORDER — AZITHROMYCIN 250 MG PO TABS: ORAL_TABLET | ORAL | Status: DC

## 2014-08-31 MED ORDER — TRAMADOL HCL 50 MG PO TABS: 50.0000 mg | ORAL_TABLET | Freq: Four times a day (QID) | ORAL | Status: AC | PRN

## 2014-08-31 MED ORDER — SENNA 8.6 MG PO TABS: 1.0000 | ORAL_TABLET | Freq: Every evening | ORAL | Status: DC | PRN

## 2014-08-31 NOTE — Progress Notes (Signed)
ANTICOAGULATION CONSULT NOTE - FOLLOW UP   Pharmacy Consult for warfarin Indication: atrial fibrillation  Allergies  Allergen Reactions  . Hydrocodone-Acetaminophen Nausea Only    Patient Measurements: Height:  (157.5 cm) Weight: 184 lb (83.462 kg) IBW/kg (Calculated) : 50.1 Heparin Dosing Weight:   Vital Signs: Temp: 97.7 F (36.5 C) (08/21 0806) Temp Source: Oral (08/21 0806) BP: 129/67 mmHg (08/21 0806) Pulse Rate: 90 (08/21 0806)  Labs:  Recent Labs  08/28/14 1711 08/29/14 0408 08/30/14 0445 08/31/14 0631  HGB 9.2* 9.4*  --  10.0*  HCT 28.8* 28.5*  --  30.9*  PLT 142* 124*  --  140*  LABPROT 20.5*  --  24.6* 23.9*  INR 1.74  --  2.20 2.12  CREATININE 1.11* 0.99 1.11*  --   TROPONINI 0.03  --   --   --     Estimated Creatinine Clearance: 41.2 mL/min (by C-G formula based on Cr of 1.11).   Medical History: Past Medical History  Diagnosis Date  . Asthma   . A-fib   . Arthritis   . Aortic stenosis   . Aortic insufficiency   . Hyperlipidemia   . Hypertension   . Sleep apnea   . CHF (congestive heart failure)   . History of DVT (deep vein thrombosis)   . Diabetes mellitus type 2, uncontrolled   . COPD (chronic obstructive pulmonary disease)   . Cervical spondylolysis   . DDD (degenerative disc disease), cervical   . Anemia   . Mitral valve disorder     Medications:  Infusions:    Assessment: Phamacy consulted to dose warfarin for 81 yo female admitted with likely AECOPD. Patient has history significant for Afib and DVT. Goal INR 2-3. Patient is receiving ceftriaxone and azithromycin in house and had has been continued on ropinirole; all of which can interact with warfarin. Patient received home dose of warfarin on 8/19, but appears to not have received dose on 8/18.  Home dose= Warfarin 5 mg daily.  Goal of Therapy:  INR 2-3 Monitor platelets by anticoagulation protocol: Yes   Plan:  Patient's INR in range goal range today. Will decrease  warfarin to  daily while on ABX. Will obtain follow-up INR with am labs.    8/21: INR=2.12. Will continue current dose of Warfarin 4 mg daily. INR in am.  Pharmacy will continue to monitor and adjust per consult.    Joneisha Miles A, Pharm.D. Clinical Pharmacist 08/31/2014,9:37 AM

## 2014-08-31 NOTE — Progress Notes (Signed)
Dr Anne Hahn notified of increase Bloodsugar 466, 458. Orders given.

## 2014-08-31 NOTE — Care Management Note (Signed)
Case Management Note  Patient Details  Name: JULICIA KRIEGER MRN: [id number] Date of Birth: 1933-03-12  Subjective/Objective:        81yo Mrs Mieko Kneebone  Was admitted 08/28/14 to an Observation bed after a fall at home and hypoxia per COPD flare. Chronic 2L N/C at home supplied by [employer]. PCP=Dr Maurine Minister. Pharmacy= [facility] on [address] in [place]. Resides with her husband Annette Stable at home. Home equipment= a rolling walker and a cane. Is currently an open client of  [facility]. Case management will follow for discharge planning.           Action/Plan:   Expected Discharge Date:                  Expected Discharge Plan:     In-House Referral:     Discharge planning Services     Post Acute Care Choice:    Choice offered to:     DME Arranged:    DME Agency:     HH Arranged:    HH Agency:     Status of Service:     Medicare Important Message Given:    Date Medicare IM Given:    Medicare IM give by:    Date Additional Medicare IM Given:    Additional Medicare Important Message give by:     If discussed at Long Length of Stay Meetings, dates discussed:    Additional Comments:  Tinnie Kunin A, RN 08/31/2014, 1:08 PM

## 2014-08-31 NOTE — Progress Notes (Signed)
[facility] [place] [facility]   PATIENT NAME: Mindy Cook    MR#:  [id number]  DATE OF BIRTH:  [birth year]  SUBJECTIVE:  CHIEF COMPLAINT:   Chief Complaint  Patient presents with  . Fall   Patient presented to the hospital with a fall and also shortness of breath and noted to be in mild COPD exacerbation. Still feels quite weak, complains of bilateral lower extremity pains, especially in the right foot toe. Admits of history of gout. Feels that because of this lower extremity pain that she is not going to do well at home. She is wondering if she could qualify for rehabilitation facility. Attempted to reach physical therapy to evaluate patient today. Discussed with care management.  Admits of some dry cough. Denies sputum ,  chest pain, does admit to shortness of breath on exertion. History of chronic atrial fibrillation.   REVIEW OF SYSTEMS:    Review of Systems  Constitutional: Negative for fever, chills and weight loss.  HENT: Negative for congestion and tinnitus.   Eyes: Negative for blurred vision and double vision.  Respiratory: Positive for cough and shortness of breath (on exertion). Negative for sputum production and wheezing.   Cardiovascular: Negative for chest pain, palpitations, orthopnea, leg swelling and PND.  Gastrointestinal: Negative for nausea, vomiting, abdominal pain, diarrhea, constipation and blood in stool.  Genitourinary: Negative for dysuria, urgency, frequency and hematuria.  Musculoskeletal: Negative for falls.  Neurological: Positive for weakness (generalized). Negative for dizziness, tremors, sensory change, focal weakness and headaches.  Endo/Heme/Allergies: Does not bruise/bleed easily.  Psychiatric/Behavioral: Negative for depression. The patient does not have insomnia.   All other systems reviewed and are negative.  admits of lower extremity pain, especially right foot toe pain  Nutrition: Heart healthy Tolerating Diet:  Yes Tolerating PT: Await evaluation  DRUG ALLERGIES:   Allergies  Allergen Reactions  . Hydrocodone-Acetaminophen Nausea Only    VITALS:  Blood pressure 129/67, pulse 90, temperature 97.7 F (36.5 C), temperature source Oral, resp. rate 20, height  (1.575 m), weight 83.462 kg (184 lb), SpO2 97 %.  PHYSICAL EXAMINATION:   Physical Exam  GENERAL:  81 y.o.-year-old patient lying in the bed with no acute distress.  EYES: Pupils equal, round, reactive to light and accommodation. No scleral icterus. Extraocular muscles intact.  HEENT: Head atraumatic, normocephalic. Oropharynx and nasopharynx clear.  NECK:  Supple, no jugular venous distention. No thyroid enlargement, no tenderness.  LUNGS: Poor respiratory effort, no wheezing, rales, rhonchi. No use of accessory muscles of respiration. Markedly diminished breath sounds bilaterally posteriorly as well as anteriorly CARDIOVASCULAR: S1, S2 normal. 2/6 systolic ejection murmur at the right sternal border, rubs, or gallops.  ABDOMEN: Soft, nontender, nondistended. Bowel sounds present. No organomegaly or mass.  EXTREMITIES: No cyanosis, clubbing, +1 edema bilaterally.  NEUROLOGIC: Cranial nerves II through XII are intact. No focal Motor or sensory deficits b/l.  Globally weak, has difficulty sitting up in bed PSYCHIATRIC: The patient is alert and oriented x 3. Good affect SKIN: No obvious rash, lesion, or ulcer.  Increased warmth of the right foot toe, but no significant swelling or ulceration noted. Very tender to palpation over movement of right toe itself.  Chronic lower extremity swelling was noted bilaterally with chronic skin changes in bilateral calves  LABORATORY PANEL:   CBC  Recent Labs Lab 08/31/14 0631  WBC 8.6  HGB 10.0*  HCT 30.9*  PLT 140*   ------------------------------------------------------------------------------------------------------------------  Chemistries   Recent Labs Lab 08/28/14  1711   08/30/14 0445  NA 142  < > 140  K 4.2  < > 3.8  CL 91*  < > 88*  CO2 45*  < > 43*  GLUCOSE 137*  < > 127*  BUN 33*  < > 39*  CREATININE 1.11*  < > 1.11*  CALCIUM 9.7  < > 9.2  AST 18  --   --   ALT 16  --   --   ALKPHOS 61  --   --   BILITOT 0.4  --   --   < > = values in this interval not displayed. ------------------------------------------------------------------------------------------------------------------  Cardiac Enzymes  Recent Labs Lab 08/28/14 1711  TROPONINI 0.03   ------------------------------------------------------------------------------------------------------------------  RADIOLOGY:  No results found.   ASSESSMENT AND PLAN:   81 year old female with past medical history of diabetes, hypertension, anxiety, history of CHF, hyperlipidemia, obstructive sleep apnea, history of previous DVT, COPD, chronic anemia, who presented to the hospital with shortness of breath and a fall and noted to be in mild COPD exacerbation. * Acute on chronic respiratory failure with hypoxia, likely due to mild COPD exacerbation, continue current management. Patient seemed to be improving overall. She is on 2 L of oxygen through nasal cannula which is her baseline  #1 generalized weakness/fall-this is probably related to patient's deconditioning, underlying UTI and mild COPD/hypoxia. Initially evaluated by physical therapist recommended home health physical therapy. However, now due to gout exacerbation. We feel that patient may qualify for rehabilitation facility where she would like to go, attempted to reach physical therapy today and discussed with care management today   #2 UTI-continue IV ceftriaxone , urine cultures not reported.  #3 COPD with mild acute exacerbation-continue Zithromax, Rocephin , Dulera, DuoNeb nebs as needed. Adding salmeterol.  -Patient is already on oxygen at home.Clinically improving, was on 3 L of oxygen on 2 L of oxygen through nasal cannula  #4  history of previous DVT-continue Coumadin. INR was subtherapeutic on admission, therapeutic today with INR of 2.2.  #5 restless leg syndrome-continue Requip.  #6 type 2 diabetes without complication-continue sliding scale insulin.  #7 hyperlipidemia-continue Pravachol.  #8 diabetic neuropathy-continue Neurontin.  #9  Constipation. Continue patient on Senokot and give one more enema per patient's request   #10 . Gout exacerbation in the right lower extremity great toe. Initiate patient on Solu-Medrol today, start tapering steroids if patient's pain subsides by tomorrow  All the records are reviewed and case discussed with Care Management/Social Workerr. Management plans discussed with the patient, family and they are in agreement.  CODE STATUS: Full  DVT Prophylaxis: Warfarin  TOTAL TIME TAKING CARE OF THIS PATIENT: 35 minutes.   POSSIBLE D/C IN 2-3 DAYS, DEPENDING ON CLINICAL CONDITION.   Katharina Caper M.D on 08/31/2014 at 10:09 AM  Between 7am to 6pm - Pager - [phone]  After 6pm go to [email or web address] - password [email or web address] [facility]  [facility]  Office  [phone]  CC: Primary care physician; Patrice Paradise, MD

## 2014-09-01 ENCOUNTER — Observation Stay: Payer: Medicare Other

## 2014-09-01 DIAGNOSIS — J441 Chronic obstructive pulmonary disease with (acute) exacerbation: Secondary | ICD-10-CM

## 2014-09-01 DIAGNOSIS — E119 Type 2 diabetes mellitus without complications: Secondary | ICD-10-CM | POA: Diagnosis not present

## 2014-09-01 DIAGNOSIS — Z79899 Other long term (current) drug therapy: Secondary | ICD-10-CM

## 2014-09-01 LAB — GLUCOSE, CAPILLARY
GLUCOSE-CAPILLARY: 395 mg/dL — AB (ref 65–99)
GLUCOSE-CAPILLARY: 399 mg/dL — AB (ref 65–99)
GLUCOSE-CAPILLARY: 413 mg/dL — AB (ref 65–99)
Glucose-Capillary: 363 mg/dL — ABNORMAL HIGH (ref 65–99)
Glucose-Capillary: 265 mg/dL — ABNORMAL HIGH (ref 65–99)
Glucose-Capillary: 420 mg/dL — ABNORMAL HIGH (ref 65–99)
Glucose-Capillary: 417 mg/dL — ABNORMAL HIGH (ref 65–99)

## 2014-09-01 LAB — BASIC METABOLIC PANEL
ANION GAP: 9 (ref 5–15)
BUN: 44 mg/dL — ABNORMAL HIGH (ref 6–20)
CHLORIDE: 87 mmol/L — AB (ref 101–111)
CO2: 44 mmol/L — ABNORMAL HIGH (ref 22–32)
Calcium: 9.9 mg/dL (ref 8.9–10.3)
Creatinine, Ser: 1.13 mg/dL — ABNORMAL HIGH (ref 0.44–1.00)
GFR, EST AFRICAN AMERICAN: 52 mL/min — AB (ref >60)
GFR, EST NON AFRICAN AMERICAN: 45 mL/min — AB (ref >60)
Glucose, Bld: 269 mg/dL — ABNORMAL HIGH (ref 65–99)
POTASSIUM: 3.8 mmol/L (ref 3.5–5.1)
SODIUM: 140 mmol/L (ref 135–145)

## 2014-09-01 LAB — URIC ACID: URIC ACID, SERUM: 8.8 mg/dL — AB (ref 2.3–6.6)

## 2014-09-01 LAB — PROTIME-INR
INR: 2.41
Prothrombin Time: 26.4 seconds — ABNORMAL HIGH (ref 11.4–15.0)

## 2014-09-01 MED ORDER — INSULIN ASPART 100 UNIT/ML ~~LOC~~ SOLN
10.0000 [IU] | Freq: Once | SUBCUTANEOUS | Status: AC
  Administered 2014-09-01: 10 [IU] via SUBCUTANEOUS
  Filled 2014-09-01: qty 10

## 2014-09-01 MED ORDER — POLYETHYLENE GLYCOL 3350 17 G PO PACK
17.0000 g | PACK | Freq: Every day | ORAL | Status: DC
  Administered 2014-09-01: 17 g via ORAL
  Filled 2014-09-01: qty 1

## 2014-09-01 MED ORDER — METHYLPREDNISOLONE 4 MG PO TBPK: 4.0000 mg | ORAL_TABLET | Freq: Four times a day (QID) | ORAL | Status: DC

## 2014-09-01 MED ORDER — METHYLPREDNISOLONE 4 MG PO TBPK
4.0000 mg | ORAL_TABLET | ORAL | Status: AC
  Administered 2014-09-01: 4 mg via ORAL

## 2014-09-01 MED ORDER — INSULIN ASPART 100 UNIT/ML ~~LOC~~ SOLN
20.0000 [IU] | Freq: Once | SUBCUTANEOUS | Status: AC
  Administered 2014-09-01: 20 [IU] via SUBCUTANEOUS

## 2014-09-01 MED ORDER — INSULIN ASPART 100 UNIT/ML ~~LOC~~ SOLN: 0.0000 [IU] | Freq: Three times a day (TID) | SUBCUTANEOUS | Status: DC

## 2014-09-01 MED ORDER — INSULIN ASPART 100 UNIT/ML ~~LOC~~ SOLN
0.0000 [IU] | Freq: Three times a day (TID) | SUBCUTANEOUS | Status: DC
  Administered 2014-09-01: 8 [IU] via SUBCUTANEOUS
  Filled 2014-09-01: qty 20
  Filled 2014-09-01: qty 8
  Filled 2014-09-01: qty 20

## 2014-09-01 MED ORDER — ALPRAZOLAM 0.25 MG PO TABS: 0.2500 mg | ORAL_TABLET | Freq: Every evening | ORAL | Status: AC

## 2014-09-01 MED ORDER — METHYLPREDNISOLONE 4 MG PO TBPK
8.0000 mg | ORAL_TABLET | Freq: Every morning | ORAL | Status: AC
  Administered 2014-09-01: 8 mg via ORAL
  Filled 2014-09-01: qty 21

## 2014-09-01 MED ORDER — METHYLPREDNISOLONE 4 MG PO TBPK: 4.0000 mg | ORAL_TABLET | Freq: Three times a day (TID) | ORAL | Status: DC

## 2014-09-01 MED ORDER — METHYLPREDNISOLONE 4 MG PO TBPK: 8.0000 mg | ORAL_TABLET | Freq: Every evening | ORAL | Status: DC

## 2014-09-01 MED ORDER — POLYETHYLENE GLYCOL 3350 17 G PO PACK: 17.0000 g | PACK | Freq: Every day | ORAL | Status: AC

## 2014-09-01 MED ORDER — METHYLPREDNISOLONE 4 MG PO TBPK: ORAL_TABLET | ORAL | Status: DC

## 2014-09-01 MED ORDER — INSULIN ASPART 100 UNIT/ML ~~LOC~~ SOLN
15.0000 [IU] | Freq: Once | SUBCUTANEOUS | Status: AC
  Administered 2014-09-01: 15 [IU] via SUBCUTANEOUS
  Filled 2014-09-01: qty 15

## 2014-09-01 NOTE — Discharge Summary (Signed)
[facility] - [place] at [facility]   PATIENT NAME: Mindy Cook    MR#:  [id number]  DATE OF BIRTH:  10/08/1933  DATE OF ADMISSION:  08/28/2014 ADMITTING PHYSICIAN: Oralia Manis, MD  DATE OF DISCHARGE:  August 22nd, 2014  PRIMARY CARE PHYSICIAN: Patrice Paradise, MD     ADMISSION DIAGNOSIS:  Hypoxia [R09.02] COPD exacerbation [J44.1] Fall against object, initial encounter [W18.09XA]  DISCHARGE DIAGNOSIS:  Principal Problem:   Acute on chronic respiratory failure Active Problems:   COPD exacerbation   Acute on chronic respiratory failure with hypoxia   Fall   Acute bronchitis   UTI (urinary tract infection)   Gout attack   Generalized weakness   Pain in joint, lower leg   A-fib   Benign essential HTN   HLD (hyperlipidemia)   Mixed incontinence   H/O aortic valve replacement   Diabetes mellitus, type 2   SECONDARY DIAGNOSIS:   Past Medical History  Diagnosis Date  . Asthma   . A-fib   . Arthritis   . Aortic stenosis   . Aortic insufficiency   . Hyperlipidemia   . Hypertension   . Sleep apnea   . CHF (congestive heart failure)   . History of DVT (deep vein thrombosis)   . Diabetes mellitus type 2, uncontrolled   . COPD (chronic obstructive pulmonary disease)   . Cervical spondylolysis   . DDD (degenerative disc disease), cervical   . Anemia   . Mitral valve disorder     .pro HOSPITAL COURSE:  The patient is a 81 year old Caucasian female with history of asthma, atrial fibrillation, arthritis, hypertension, hyperlipidemia, congestive heart failure, DVT, diabetes, and other multiple medical problems who presented to the hospital with hypoxia and fall at home. Patient has been declining for the couple of months now and that she was noted to have be hypoxic with oxygen saturations in 70s on her home oxygen monitor. Her oxygen saturations, however, improved in emergency room when she was given DuoNeb nebs and she was admitted to the  hospital with diagnosis of COPD exacerbation. Her initial chest x-ray done on August 18 revealed prominent mediastinal and hilar contours but no pneumonia. Her labs revealed an elevated potassium level to 5.2 and elevated CO2 level of 41. Patient's white blood cell count was normal. Urine analysis revealed turbid appearance, many bacteria. It was positive for nitrites as well as leukocyte esterase and the too numerous white blood cells were noted on the study. Urine cultures unfortunately were not taken. Patient was admitted to the hospital, started on Rocephin and Zithromax for UTI and bronchitis. She was also initiated on nebulizing treatment inhalers and improved clinically. Her oxygen saturations improved. She was seen by physical therapist and recommended home health physical therapy initially. However, as time went on, she was noted to have gout attack in the right great toe and was having difficulty ambulating, necessitating reassessment of her discharge planning. Physical therapist felt that patient would benefit from rehabilitation facility where patient will be discharged today on 09/01/2014. Discussion by problem 81 year old female with past medical history of diabetes, hypertension, anxiety, history of CHF, hyperlipidemia, obstructive sleep apnea, history of previous DVT, COPD, chronic anemia, who presented to the hospital with shortness of breath and a fall and noted to be in mild COPD exacerbation.  * Acute on chronic respiratory failure with hypoxia, likely due to mild COPD exacerbation, continue current management. Patient was improving overall, but today she was on 3 L of oxygen through  nasal cannula with baseline being 2 L, which may be patient's new baseline now. Today, however, I heard crackles in her lung fields concerning for congestive heart failure exacerbation. We are awaiting for repeated chest x-ray results, however, we are reinitiating patient back on Lasix and Zaroxolyn combination.  Continue Zithromax orally to complete course. Discussed with patient's son, all questions answered, he  voiced understanding  #1 generalized weakness/fall-this is probably related to patient's deconditioning, underlying UTI and mild COPD/hypoxia. Initially evaluated by physical therapist recommended home health physical therapy. However, had gout exacerbation, reevaluated by physical therapist today and skilled nursing facility rehabilitation placement was recommended. We will consult social workers to help [place] with placement. Discussed with patient's son extensively. Of note, patient had an echocardiogram done during this admission revealing normal ejection fraction, however, patient did have moderate tricuspid as well as  mitral valve regurgitation  #2 UTI-completed ceftriaxone course, urine cultures not reported.  #3 COPD with mild acute exacerbation-continue Zithromax for 2 more days, Dulera, salmeterol, DuoNeb nebs as needed. .  -Patient was already on oxygen at home.Clinically she was improving. However, today she was on 3 L of oxygen through nasal cannulas, getting chest x-ray repeated to rule out fluid retention/CHF exacerbation, although where to start the patient back on Zaroxolyn upon discharge.   #4 history of previous DVT-continue Coumadin. INR was subtherapeutic on admission, therapeutic today with INR of 2.4. Follow-up as outpatient closely  #5 restless leg syndrome-continue Requip.  #6 type 2 diabetes without complication-patient's blood glucose levels were high from 250s to 400s today due to steroids , patient was continued on  sliding scale insulin while in the hospital, which would be a good idea to continue while patient's sons is on steroids in skilled nursing facility, but we will be tapering steroids now.  #7 hyperlipidemia-continue Pravachol.  #8 diabetic neuropathy-continue Neurontin.  #9 Constipation. Continue patient on Senokot , MiraLAX. May need to add other  medications as patient is intermittently uncomfortable, although she did have last bowel movement on 08/30/2014  #10 . Gout exacerbation in the right lower extremity great toe. Improved on Solu-Medrol  over the past 24 hours . Tapering steroids with Medrol dosepak. Patient may benefit from allopurinol whenever gout attack is over. Uric acid level was 8.8 today #11. Benzodiazepine use in elderly age. We are asking psychologist to see patient and advice about tapering these medications and use other medications if patient does have problems with anxiety. Dr. Toni Amend is  scheduled to see patient, but hasn't seen her yet. It is recommended to be to wean patient off of benzodiazepine such as outpatient if Dr. Toni Amend does not see her until she leaves to skilled nursing facility.   DISCHARGE CONDITIONS:   Fair  CONSULTS OBTAINED:  Treatment Team:  Audery Amel, MD  DRUG ALLERGIES:   Allergies  Allergen Reactions  . Hydrocodone-Acetaminophen Nausea Only    DISCHARGE MEDICATIONS:   Current Discharge Medication List    START taking these medications   Details  azithromycin (ZITHROMAX) 250 MG tablet Take once daily for 2 more days Qty: 2 each, Refills: 0    docusate sodium (COLACE) 100 MG capsule Take 1 capsule (100 mg total) by mouth 2 (two) times daily. Qty: 10 capsule, Refills: 0    ipratropium-albuterol (DUONEB) 0.5-2.5 (3) MG/3ML SOLN Take 3 mLs by nebulization every 4 (four) hours as needed. Qty: 360 mL, Refills: 1    !! methylPREDNISolone (MEDROL DOSEPAK) 4 MG TBPK tablet follow package directions Qty: 21  tablet, Refills: 0    !! methylPREDNISolone (MEDROL DOSEPAK) 4 MG TBPK tablet follow package directions Qty: 21 tablet, Refills: 0    polyethylene glycol (MIRALAX / GLYCOLAX) packet Take 17 g by mouth daily. Qty: 14 each, Refills: 0    senna (SENOKOT) 8.6 MG TABS tablet Take 1 tablet (8.6 mg total) by mouth at bedtime as needed for mild constipation. Qty: 120 each,  Refills: 0    traMADol (ULTRAM) 50 MG tablet Take 1 tablet (50 mg total) by mouth every 6 (six) hours as needed for moderate pain. Qty: 30 tablet, Refills: 0     !! - Potential duplicate medications found. Please discuss with provider.    CONTINUE these medications which have CHANGED   Details  ALPRAZolam (XANAX) 0.25 MG tablet Take 1 tablet (0.25 mg total) by mouth every evening. Qty: 30 tablet, Refills: 0      CONTINUE these medications which have NOT CHANGED   Details  azelastine (ASTELIN) 0.1 % nasal spray Place 1 spray into both nostrils 2 (two) times daily.    carvedilol (COREG) 3.125 MG tablet Take 3.125 mg by mouth 2 (two) times daily.    clotrimazole (LOTRIMIN) 1 % cream Apply 1 application topically 2 (two) times daily. Pt mixes with zinc oxide cream.    Cranberry 500 MG CAPS Take 500 mg by mouth daily.    cyclobenzaprine (FLEXERIL) 5 MG tablet Take 5 mg by mouth 3 (three) times daily as needed for muscle spasms.    diclofenac sodium (VOLTAREN) 1 % GEL Apply 2 g topically 2 (two) times daily.    ferrous sulfate 325 (65 FE) MG tablet Take 325 mg by mouth daily.    fluticasone (FLONASE) 50 MCG/ACT nasal spray Place 2 sprays into both nostrils daily as needed for allergies or rhinitis.    Fluticasone-Salmeterol (ADVAIR) 250-50 MCG/DOSE AEPB Inhale 1 puff into the lungs 2 (two) times daily.    furosemide (LASIX) 40 MG tablet Take 40 mg by mouth 2 (two) times daily.    gabapentin (NEURONTIN) 300 MG capsule Take 600 mg by mouth 3 (three) times daily. Pt is able to take one extra capsule at bedtime.    glimepiride (AMARYL) 1 MG tablet Take 1 mg by mouth 2 (two) times daily.    meclizine (ANTIVERT) 12.5 MG tablet Take 12.5 mg by mouth 3 (three) times daily as needed for dizziness.    metolazone (ZAROXOLYN) 5 MG tablet Take 5 mg by mouth 3 (three) times a week. Pt takes on Monday, Thursday, and Sunday.    mirabegron ER (MYRBETRIQ) 50 MG TB24 tablet Take 1 tablet (50 mg  total) by mouth daily. Qty: 30 tablet, Refills: 2    mometasone (NASONEX) 50 MCG/ACT nasal spray Place 2 sprays into the nose daily.    montelukast (SINGULAIR) 10 MG tablet Take 10 mg by mouth daily.    nitrofurantoin (MACRODANTIN) 50 MG capsule Take 1 capsule (50 mg total) by mouth at bedtime. Qty: 90 capsule, Refills: 0    potassium chloride SA (K-DUR,KLOR-CON) 20 MEQ tablet Take 20 mEq by mouth 2 (two) times daily.    pravastatin (PRAVACHOL) 40 MG tablet Take 40 mg by mouth at bedtime.    promethazine (PHENERGAN) 12.5 MG tablet Take 12.5 mg by mouth every 8 (eight) hours as needed for nausea or vomiting.    rOPINIRole (REQUIP) 0.25 MG tablet Take 0.25 mg by mouth at bedtime.    warfarin (COUMADIN) 5 MG tablet Take 5 mg by mouth daily.  ZINC OXIDE, TOPICAL, 10 % CREA Apply 1 application topically 2 (two) times daily. Pt mixes with Clotrimazole cream.         DISCHARGE INSTRUCTIONS:    Patient is to follow-up with her primary care physician, Dr. Merlinda Frederick in the next few days after discharge  If you experience worsening of your admission symptoms, develop shortness of breath, life threatening emergency, suicidal or homicidal thoughts you must seek medical attention immediately by calling 911 or calling your MD immediately  if symptoms less severe.  You Must read complete instructions/literature along with all the possible adverse reactions/side effects for all the Medicines you take and that have been prescribed to you. Take any new Medicines after you have completely understood and accept all the possible adverse reactions/side effects.   Please note  You were cared for by a hospitalist during your hospital stay. If you have any questions about your discharge medications or the care you received while you were in the hospital after you are discharged, you can call the unit and asked to speak with the hospitalist on call if the hospitalist that took care of you is not  available. Once you are discharged, your primary care physician will handle any further medical issues. Please note that NO REFILLS for any discharge medications will be authorized once you are discharged, as it is imperative that you return to your primary care physician (or establish a relationship with a primary care physician if you do not have one) for your aftercare needs so that they can reassess your need for medications and monitor your lab values.    Today   CHIEF COMPLAINT:   Chief Complaint  Patient presents with  . Fall    HISTORY OF PRESENT ILLNESS:  Mindy Cook  is a 81 y.o. female with a known history of asthma, atrial fibrillation, arthritis, hypertension, hyperlipidemia, congestive heart failure, DVT, diabetes, and other multiple medical problems who presented to the hospital with hypoxia and fall at home. Patient has been declining for the couple of months now and that she was noted to have be hypoxic with oxygen saturations in 70s on her home oxygen monitor. Her oxygen saturations, however, improved in emergency room when she was given DuoNeb nebs and she was admitted to the hospital with diagnosis of COPD exacerbation. Her initial chest x-ray done on August 18 revealed prominent mediastinal and hilar contours but no pneumonia. Her labs revealed an elevated potassium level to 5.2 and elevated CO2 level of 41. Patient's white blood cell count was normal. Urine analysis revealed turbid appearance, many bacteria. It was positive for nitrites as well as leukocyte esterase and the too numerous white blood cells were noted on the study. Urine cultures unfortunately were not taken. Patient was admitted to the hospital, started on Rocephin and Zithromax for UTI and bronchitis. She was also initiated on nebulizing treatment inhalers and improved clinically. Her oxygen saturations improved. She was seen by physical therapist and recommended home health physical therapy initially. However, as  time went on, she was noted to have gout attack in the right great toe and was having difficulty ambulating, necessitating reassessment of her discharge planning. Physical therapist felt that patient would benefit from rehabilitation facility where patient will be discharged today on 09/01/2014. Discussion by problem 81 year old female with past medical history of diabetes, hypertension, anxiety, history of CHF, hyperlipidemia, obstructive sleep apnea, history of previous DVT, COPD, chronic anemia, who presented to the hospital with shortness of breath and a fall and  noted to be in mild COPD exacerbation.  * Acute on chronic respiratory failure with hypoxia, likely due to mild COPD exacerbation, continue current management. Patient was improving overall, but today she was on 3 L of oxygen through nasal cannula with baseline being 2 L, which may be patient's new baseline now. Today, however, I heard crackles in her lung fields concerning for congestive heart failure exacerbation. We are awaiting for repeated chest x-ray results, however, we are reinitiating patient back on Lasix and Zaroxolyn combination. Continue Zithromax orally to complete course. Discussed with patient's son, all questions answered, he  voiced understanding  #1 generalized weakness/fall-this is probably related to patient's deconditioning, underlying UTI and mild COPD/hypoxia. Initially evaluated by physical therapist recommended home health physical therapy. However, had gout exacerbation, reevaluated by physical therapist today and skilled nursing facility rehabilitation placement was recommended. We will consult social workers to help [place] with placement. Discussed with patient's son extensively. Of note, patient had an echocardiogram done during this admission revealing normal ejection fraction, however, patient did have moderate tricuspid as well as  mitral valve regurgitation  #2 UTI-completed ceftriaxone course, urine cultures not  reported.  #3 COPD with mild acute exacerbation-continue Zithromax for 2 more days, Dulera, salmeterol, DuoNeb nebs as needed. .  -Patient was already on oxygen at home.Clinically she was improving. However, today she was on 3 L of oxygen through nasal cannulas, getting chest x-ray repeated to rule out fluid retention/CHF exacerbation, although where to start the patient back on Zaroxolyn upon discharge.   #4 history of previous DVT-continue Coumadin. INR was subtherapeutic on admission, therapeutic today with INR of 2.4. Follow-up as outpatient closely  #5 restless leg syndrome-continue Requip.  #6 type 2 diabetes without complication-patient's blood glucose levels were high from 250s to 400s today due to steroids , patient was continued on  sliding scale insulin while in the hospital, which would be a good idea to continue while patient's sons is on steroids in skilled nursing facility, but we will be tapering steroids now.  #7 hyperlipidemia-continue Pravachol.  #8 diabetic neuropathy-continue Neurontin.  #9 Constipation. Continue patient on Senokot , MiraLAX. May need to add other medications as patient is intermittently uncomfortable, although she did have last bowel movement on 08/30/2014  #10 . Gout exacerbation in the right lower extremity great toe. Improved on Solu-Medrol  over the past 24 hours . Tapering steroids with Medrol dosepak. Patient may benefit from allopurinol whenever gout attack is over. Uric acid level was 8.8 today #11. Benzodiazepine use in elderly age. We are asking psychologist to see patient and advice about tapering these medications and use other medications if patient does have problems with anxiety. Dr. Toni Amend is  scheduled to see patient, but hasn't seen her yet. It is recommended to be to wean patient off of benzodiazepine such as outpatient if Dr. Toni Amend does not see her until she leaves to skilled nursing facility.    VITAL SIGNS:  Blood pressure  150/69, pulse 49, temperature 97.7 F (36.5 C), temperature source Oral, resp. rate 18, height 5\' 2"  (1.575 m), weight 82.146 kg (181 lb 1.6 oz), SpO2 94 %.  I/O:   Intake/Output Summary (Last 24 hours) at 09/01/14 1439 Last data filed at 09/01/14 1300  Gross per 24 hour  Intake   1140 ml  Output      0 ml  Net   1140 ml    PHYSICAL EXAMINATION:  GENERAL:  81 y.o.-year-old patient lying in the bed with no acute  distress.  EYES: Pupils equal, round, reactive to light and accommodation. No scleral icterus. Extraocular muscles intact.  HEENT: Head atraumatic, normocephalic. Oropharynx and nasopharynx clear.  NECK:  Supple, no jugular venous distention. No thyroid enlargement, no tenderness.  LUNGS: Some diminished breath sounds bilaterally, bilateral crackles posteriorly, scattered, no wheezing, rales,rhonchi. No use of accessory muscles of respiration.  CARDIOVASCULAR: S1, S2 normal. No murmurs, rubs, or gallops.  ABDOMEN: Soft, non-tender, non-distended. Bowel sounds present. No organomegaly or mass.  EXTREMITIES: Trace bilateral lower extremity edema , no pedal edema, cyanosis, or clubbing.  NEUROLOGIC: Cranial nerves II through XII are intact. Muscle strength 5/5 in all extremities. Sensation intact. Gait not checked.  PSYCHIATRIC: The patient is alert and oriented x 3.  SKIN: No obvious rash, lesion, or ulcer.   DATA REVIEW:   CBC  Recent Labs Lab 08/31/14 0631  WBC 8.6  HGB 10.0*  HCT 30.9*  PLT 140*    Chemistries   Recent Labs Lab 08/28/14 1711  09/01/14 0551  NA 142  < > 140  K 4.2  < > 3.8  CL 91*  < > 87*  CO2 45*  < > 44*  GLUCOSE 137*  < > 269*  BUN 33*  < > 44*  CREATININE 1.11*  < > 1.13*  CALCIUM 9.7  < > 9.9  AST 18  --   --   ALT 16  --   --   ALKPHOS 61  --   --   BILITOT 0.4  --   --   < > = values in this interval not displayed.  Cardiac Enzymes  Recent Labs Lab 08/28/14 1711  TROPONINI 0.03    Microbiology Results  Results for  orders placed or performed in visit on 07/22/14  Microscopic Examination     Status: Abnormal   Collection Time: 07/22/14  2:26 PM  Result Value Ref Range Status   WBC, UA >30 (A) 0 -  5 /hpf Final   RBC, UA 3-10 (A) 0 -  2 /hpf Final   Epithelial Cells (non renal) 0-10 0 - 10 /hpf Final   Renal Epithel, UA 0-10 (A) None seen /hpf Final   Bacteria, UA Moderate (A) None seen/Few Final  CULTURE, URINE COMPREHENSIVE     Status: Abnormal   Collection Time: 07/22/14  3:11 PM  Result Value Ref Range Status   Urine Culture, Comprehensive Final report (A)  Final   Result 1 Escherichia coli (A)  Final    Comment: Greater than 100,000 colony forming units per mL   ANTIMICROBIAL SUSCEPTIBILITY Comment  Final    Comment:       ** S = Susceptible; I = Intermediate; R = Resistant **                    P = Positive; N = Negative             MICS are expressed in micrograms per mL    Antibiotic                 RSLT#1    RSLT#2    RSLT#3    RSLT#4 Amoxicillin/Clavulanic Acid    I Ampicillin                     R Cefepime                       S Ceftriaxone  S Cefuroxime                     S Cephalothin                    I Ciprofloxacin                  R Ertapenem                      S Gentamicin                     S Imipenem                       S Levofloxacin                   R Nitrofurantoin                 S Piperacillin                   S Tetracycline                   R Tobramycin                     S Trimethoprim/Sulfa             R     RADIOLOGY:  US Venous Img Lower Bilateral  08/31/2014   CLINICAL DATA:  Lower extremity swelling for 2 days.  EXAM: BILATERAL LOWER EXTREMITY VENOUS DOPPLER ULTRASOUND  TECHNIQUE: Gray-scale sonography with graded compression, as well as color Doppler and duplex ultrasound were performed to evaluate the lower extremity deep venous systems from the level of the common femoral vein and including the common femoral, femoral,  profunda femoral, popliteal and calf veins including the posterior tibial, peroneal and gastrocnemius veins when visible. The superficial great saphenous vein was also interrogated. Spectral Doppler was utilized to evaluate flow at rest and with distal augmentation maneuvers in the common femoral, femoral and popliteal veins.  COMPARISON:  None.  FINDINGS: RIGHT LOWER EXTREMITY  Common Femoral Vein: No evidence of thrombus. Normal compressibility, respiratory phasicity and response to augmentation.  Saphenofemoral Junction: No evidence of thrombus. Normal compressibility and flow on color Doppler imaging.  Profunda Femoral Vein: No evidence of thrombus. Normal compressibility and flow on color Doppler imaging.  Femoral Vein: No evidence of thrombus. Normal compressibility, respiratory phasicity and response to augmentation.  Popliteal Vein: No evidence of thrombus. Normal compressibility, respiratory phasicity and response to augmentation.  Calf Veins: No evidence of thrombus. Normal compressibility and flow on color Doppler imaging. Peroneal vein not well visualized.  Superficial Great Saphenous Vein: No evidence of thrombus. Normal compressibility and flow on color Doppler imaging.  Venous Reflux:  None.  Other Findings:  None.  LEFT LOWER EXTREMITY  Common Femoral Vein: No evidence of thrombus. Normal compressibility, respiratory phasicity and response to augmentation.  Saphenofemoral Junction: No evidence of thrombus. Normal compressibility and flow on color Doppler imaging.  Profunda Femoral Vein: No evidence of thrombus. Normal compressibility and flow on color Doppler imaging.  Femoral Vein: There is nonocclusive thrombus in the distal superficial femoral vein extending the popliteal vein.  Popliteal Vein: Nonocclusive thrombus noted in the popliteal vein.  Calf Veins: No evidence of thrombus. Normal compressibility and flow on color Doppler imaging. Peroneal vein not well visualized.  Superficial Great  Saphenous  Vein: No evidence of thrombus. Normal compressibility and flow on color Doppler imaging.  Venous Reflux:  None.  Other Findings:  None.  IMPRESSION: 1. Nonocclusive thrombus evident in the left distal superficial femoral vein and the popliteal vein. No other evidence of deep venous thrombosis on the left. 2. No evidence of deep venous thrombosis in the right lower extremity.   Electronically Signed   By: Amie Portland M.D.   On: 08/31/2014 15:59    EKG:   Orders placed or performed during the hospital encounter of 08/28/14  . ED EKG  . ED EKG  . EKG 12-Lead  . EKG 12-Lead  . EKG 12-Lead  . EKG 12-Lead      Management plans discussed with the patient, family and they are in agreement.  CODE STATUS:     Code Status Orders        Start     Ordered   08/28/14 2209  Full code   Continuous     08/28/14 2208      TOTAL TIME TAKING CARE OF THIS PATIENT: 40 minutes.    Katharina Caper M.D on 09/01/2014 at 2:39 PM  Between 7am to 6pm - Pager - [phone]  After 6pm go to [email or web address] - password [email or web address]  [facility]  Office  [phone]  CC: Primary care physician; Patrice Paradise, MD

## 2014-09-01 NOTE — Consult Note (Signed)
[facility] Consult   Reason for Consult:  Consult for this 81 year old woman in the hospital for respiratory failure and multiple medical problems Referring Physician:  Judeen Hammans Patient Identification: Mindy Cook MRN:  [id number] Principal Diagnosis: Acute on chronic respiratory failure Diagnosis:   Patient Active Problem List   Diagnosis Date Noted  . Acute on chronic respiratory failure with hypoxia [J96.21] 08/31/2014  . Acute bronchitis [J20.9] 08/31/2014  . UTI (urinary tract infection) [N39.0] 08/31/2014  . Gout attack [M10.9] 08/31/2014  . Generalized weakness [R53.1] 08/31/2014  . Pain in joint, lower leg [M25.569] 08/31/2014  . Acute on chronic respiratory failure [J96.20] 08/28/2014  . COPD exacerbation [J44.1] 08/28/2014  . Fall [W19.XXXA] 08/28/2014  . A-fib [I48.91] 07/21/2014  . CAFL (chronic airflow limitation) [J44.9] 07/21/2014  . H/O deep venous thrombosis [Z86.718] 07/21/2014  . HLD (hyperlipidemia) [E78.5] 07/21/2014  . Apnea, sleep [G47.30] 07/21/2014  . Complete rotator cuff rupture of left shoulder [M75.122] 07/06/2014  . Cervico-occipital neuralgia [M54.81] 07/15/2013  . Cervical osteoarthritis [M47.812] 06/28/2013  . DDD (degenerative disc disease), cervical [M50.30] 06/28/2013  . Muscle spasms of neck [M62.48] 06/28/2013  . Renal colic [M84] 13/24/4010  . Neuropathy [G62.9] 05/23/2013  . History of open heart surgery [Z98.89] 05/07/2013  . Arthritis [M19.90] 04/23/2013  . Acquired cyst of kidney [N28.1] 11/20/2012  . Hypercapnemia [R06.89] 06/12/2012  . H/O aortic valve replacement [Z95.4] 06/12/2012  . Congestive heart failure [I50.9] 04/26/2012  . Heart failure [I50.9] 04/26/2012  . Absolute anemia [D64.9] 02/08/2012  . Aortic valve defect [I35.9] 02/08/2012  . Benign essential HTN [I10] 02/08/2012  . Bladder infection, chronic [N30.20] 02/08/2012  . Calculus of gallbladder [K80.20] 02/08/2012  . Calculus of kidney [N20.0]  02/08/2012  . Biliary calculi [K80.20] 02/08/2012  . Dermatophytosis of scalp and beard [B35.0] 02/08/2012  . Incomplete bladder emptying [R33.9] 02/08/2012  . Disorder of mitral valve [I05.9] 02/08/2012  . Mixed incontinence [N39.46] 02/08/2012  . Diabetes mellitus, type 2 [E11.9] 02/08/2012    Total Time spent with patient: 30 minutes  Subjective:   [name] is a 81 y.o. female patient admitted with "I've been having trouble breathing". Patient denies any anxiety or depressive symptoms. Consult was specifically because of concern about benzodiazepine use.Marland Kitchen  HPI:  Information from the patient and the chart. Patient came into the hospital because of respiratory failure. Multiple medical problems. Concern was raised by the hospitalist about patient's use of benzodiazepine's. Patient tells me that she uses Xanax quarter milligram strength usually only about a half pill 2 or 3 times a week at most help either with sleep or anxiety. Denies that she was ever taking more of it than that. Denies any history of abuse. Denies any current problems with depression or acute anxiety. No other ongoing psychiatric concerns.  Past psychiatric history: Mild anxiety treated by her primary care doctor. No history of psychiatric hospitalizations suicide attempt or violence.  Social history: Patient lives with her husband. Has adult children living in the area. Retired. Plans to go to rehabilitation directly from discharge here.  Medical history: Multiple medical problems including COPD atrial fibrillation and hypertension dyslipidemia history of aortic valve replacement diabetes  Substance abuse history: Denies any history of substance abuse problems none documented. HPI Elements:   Quality:  mild anxiety. Severity:  mild. Timing:  chronic. Duration:  ongoing and intermittent. Context:  multiple medical problems.  Past Medical History:  Past Medical History  Diagnosis Date  . Asthma   . A-fib   .  Arthritis   . Aortic stenosis   . Aortic insufficiency   . Hyperlipidemia   . Hypertension   . Sleep apnea   . CHF (congestive heart failure)   . History of DVT (deep vein thrombosis)   . Diabetes mellitus type 2, uncontrolled   . COPD (chronic obstructive pulmonary disease)   . Cervical spondylolysis   . DDD (degenerative disc disease), cervical   . Anemia   . Mitral valve disorder     Past Surgical History  Procedure Laterality Date  . Abdominal hysterectomy    . Cardiac catheterization    . Colonoscopy     Family History:  Family History  Problem Relation Age of Onset  . Heart failure Mother   . Cancer Father   . Heart disease Father   . Breast cancer Sister   . Fibromyalgia Sister    Social History:  History  Alcohol Use No     History  Drug Use No    Social History   Social History  . Marital Status: Married    Spouse Name: N/A  . Number of Children: N/A  . Years of Education: N/A   Social History Main Topics  . Smoking status: Never Smoker   . Smokeless tobacco: None  . Alcohol Use: No  . Drug Use: No  . Sexual Activity: Not Asked   Other Topics Concern  . None   Social History Narrative   Additional Social History:                          Allergies:   Allergies  Allergen Reactions  . Hydrocodone-Acetaminophen Nausea Only    Labs:  Results for orders placed or performed during the hospital encounter of 08/28/14 (from the past 48 hour(s))  Glucose, capillary     Status: Abnormal   Collection Time: 08/30/14 10:21 PM  Result Value Ref Range   Glucose-Capillary 263 (H) 65 - 99 mg/dL   Comment 1 Notify RN   Protime-INR     Status: Abnormal   Collection Time: 08/31/14  6:31 AM  Result Value Ref Range   Prothrombin Time 23.9 (H) 11.4 - 15.0 seconds   INR 2.12   CBC     Status: Abnormal   Collection Time: 08/31/14  6:31 AM  Result Value Ref Range   WBC 8.6 3.6 - 11.0 K/uL   RBC 3.36 (L) 3.80 - 5.20 MIL/uL   Hemoglobin 10.0  (L) 12.0 - 16.0 g/dL   HCT 30.9 (L) 35.0 - 47.0 %   MCV 91.9 80.0 - 100.0 fL   MCH 29.7 26.0 - 34.0 pg   MCHC 32.3 32.0 - 36.0 g/dL   RDW 15.5 (H) 11.5 - 14.5 %   Platelets 140 (L) 150 - 440 K/uL  Glucose, capillary     Status: Abnormal   Collection Time: 08/31/14  7:32 AM  Result Value Ref Range   Glucose-Capillary 188 (H) 65 - 99 mg/dL   Comment 1 Notify RN   Glucose, capillary     Status: Abnormal   Collection Time: 08/31/14 11:46 AM  Result Value Ref Range   Glucose-Capillary 304 (H) 65 - 99 mg/dL  Glucose, capillary     Status: Abnormal   Collection Time: 08/31/14  4:17 PM  Result Value Ref Range   Glucose-Capillary 359 (H) 65 - 99 mg/dL  Glucose, capillary     Status: Abnormal   Collection Time: 08/31/14  9:36 PM  Result  Value Ref Range   Glucose-Capillary 466 (H) 65 - 99 mg/dL  Glucose, capillary     Status: Abnormal   Collection Time: 08/31/14 10:22 PM  Result Value Ref Range   Glucose-Capillary 458 (H) 65 - 99 mg/dL   Comment 1 Document in Chart    Comment 2 Call MD NNP PA CNM   Glucose, capillary     Status: Abnormal   Collection Time: 09/01/14 12:23 AM  Result Value Ref Range   Glucose-Capillary 399 (H) 65 - 99 mg/dL  Glucose, capillary     Status: Abnormal   Collection Time: 09/01/14  2:09 AM  Result Value Ref Range   Glucose-Capillary 363 (H) 65 - 99 mg/dL  Protime-INR     Status: Abnormal   Collection Time: 09/01/14  5:51 AM  Result Value Ref Range   Prothrombin Time 26.4 (H) 11.4 - 15.0 seconds   INR 7.47   Basic metabolic panel     Status: Abnormal   Collection Time: 09/01/14  5:51 AM  Result Value Ref Range   Sodium 140 135 - 145 mmol/L   Potassium 3.8 3.5 - 5.1 mmol/L   Chloride 87 (L) 101 - 111 mmol/L   CO2 44 (H) 22 - 32 mmol/L   Glucose, Bld 269 (H) 65 - 99 mg/dL   BUN 44 (H) 6 - 20 mg/dL   Creatinine, Ser 1.13 (H) 0.44 - 1.00 mg/dL   Calcium 9.9 8.9 - 10.3 mg/dL   GFR calc non Af Amer 45 (L) >60 mL/min   GFR calc Af Amer 52 (L) >60 mL/min     Comment: (NOTE) The eGFR has been calculated using the CKD EPI equation. This calculation has not been validated in all clinical situations. eGFR's persistently <60 mL/min signify possible Chronic Kidney Disease.    Anion gap 9 5 - 15  Uric acid     Status: Abnormal   Collection Time: 09/01/14  5:51 AM  Result Value Ref Range   Uric Acid, Serum 8.8 (H) 2.3 - 6.6 mg/dL  Glucose, capillary     Status: Abnormal   Collection Time: 09/01/14  7:55 AM  Result Value Ref Range   Glucose-Capillary 265 (H) 65 - 99 mg/dL  Glucose, capillary     Status: Abnormal   Collection Time: 09/01/14 11:13 AM  Result Value Ref Range   Glucose-Capillary 413 (H) 65 - 99 mg/dL  Glucose, capillary     Status: Abnormal   Collection Time: 09/01/14 11:50 AM  Result Value Ref Range   Glucose-Capillary 420 (H) 65 - 99 mg/dL  Glucose, capillary     Status: Abnormal   Collection Time: 09/01/14  2:20 PM  Result Value Ref Range   Glucose-Capillary 395 (H) 65 - 99 mg/dL  Glucose, capillary     Status: Abnormal   Collection Time: 09/01/14  5:44 PM  Result Value Ref Range   Glucose-Capillary 417 (H) 65 - 99 mg/dL   Comment 1 Notify RN     Vitals: Blood pressure 144/85, pulse 79, temperature 98.1 F (36.7 C), temperature source Oral, resp. rate 17, height 5' 2"  (1.575 m), weight 82.146 kg (181 lb 1.6 oz), SpO2 96 %.  Risk to Self: Is patient at risk for suicide?: No Risk to Others:   Prior Inpatient Therapy:   Prior Outpatient Therapy:    Current Facility-Administered Medications  Medication Dose Route Frequency Provider Last Rate Last Dose  . acetaminophen (TYLENOL) tablet 650 mg  650 mg Oral Q6H PRN Lance Coon, MD  650 mg at 09/01/14 0734  . ALPRAZolam Duanne Moron) tablet 0.25 mg  0.25 mg Oral QPM Lance Coon, MD   0.25 mg at 09/01/14 1742  . azithromycin (ZITHROMAX) tablet 250 mg  250 mg Oral Daily Lance Coon, MD   250 mg at 08/31/14 2009  . carvedilol (COREG) tablet 3.125 mg  3.125 mg Oral BID Lance Coon, MD   3.125 mg at 08/31/14 2301  . docusate sodium (COLACE) capsule 100 mg  100 mg Oral BID Henreitta Leber, MD   100 mg at 09/01/14 1014  . furosemide (LASIX) tablet 40 mg  40 mg Oral BID Lance Coon, MD   40 mg at 09/01/14 1742  . gabapentin (NEURONTIN) capsule 600 mg  600 mg Oral TID Lance Coon, MD   600 mg at 09/01/14 1623  . insulin aspart (novoLOG) injection 0-15 Units  0-15 Units Subcutaneous TID AC & HS Lance Coon, MD   8 Units at 09/01/14 [phone]  . ipratropium-albuterol (DUONEB) 0.5-2.5 (3) MG/3ML nebulizer solution 3 mL  3 mL Nebulization Q4H PRN Lance Coon, MD      . Derrill Memo ON 09/02/2014] methylPREDNISolone (MEDROL DOSEPAK) tablet 4 mg  4 mg Oral 3 x daily with food Theodoro Grist, MD      . Derrill Memo ON 09/03/2014] methylPREDNISolone (MEDROL DOSEPAK) tablet 4 mg  4 mg Oral 4X daily taper Theodoro Grist, MD      . methylPREDNISolone (MEDROL DOSEPAK) tablet 8 mg  8 mg Oral Nightly Theodoro Grist, MD      . Derrill Memo ON 09/02/2014] methylPREDNISolone (MEDROL DOSEPAK) tablet 8 mg  8 mg Oral Nightly Theodoro Grist, MD      . mirabegron ER (MYRBETRIQ) tablet 50 mg  50 mg Oral Daily Lance Coon, MD   50 mg at 09/01/14 1014  . mometasone-formoterol (DULERA) 100-5 MCG/ACT inhaler 2 puff  2 puff Inhalation BID Lance Coon, MD   2 puff at 09/01/14 0805  . montelukast (SINGULAIR) tablet 10 mg  10 mg Oral QHS Lance Coon, MD   10 mg at 08/31/14 2248  . polyethylene glycol (MIRALAX / GLYCOLAX) packet 17 g  17 g Oral Daily Theodoro Grist, MD   17 g at 09/01/14 1421  . pravastatin (PRAVACHOL) tablet 40 mg  40 mg Oral QHS Lance Coon, MD   40 mg at 08/31/14 2248  . promethazine (PHENERGAN) tablet 12.5 mg  12.5 mg Oral Q8H PRN Lance Coon, MD   12.5 mg at 08/28/14 2241  . rOPINIRole (REQUIP) tablet 0.25 mg  0.25 mg Oral QHS Lance Coon, MD   0.25 mg at 08/31/14 2248  . senna (SENOKOT) tablet 8.6 mg  1 tablet Oral QHS PRN Theodoro Grist, MD      . sodium chloride 0.9 % injection 3 mL  3 mL Intravenous  Q12H Lance Coon, MD   3 mL at 08/31/14 2247  . traMADol (ULTRAM) tablet 50 mg  50 mg Oral Q6H PRN Theodoro Grist, MD   50 mg at 08/31/14 1050  . warfarin (COUMADIN) tablet 4 mg  4 mg Oral q1800 Charlett Nose, RPH   4 mg at 09/01/14 1742  . Warfarin - Pharmacist Dosing Inpatient   Does not apply q1800 Lance Coon, MD      . zinc oxide 20 % ointment 1 application  1 application Topical BID Lance Coon, MD   1 application at 55/97/41 1411    Musculoskeletal: Strength & Muscle Tone: decreased Gait & Station: unsteady Patient leans: N/A  Psychiatric Specialty  Exam: Physical Exam  Nursing note and vitals reviewed. Constitutional: She appears well-developed and well-nourished. Nasal cannula in place.  HENT:  Head: Normocephalic and atraumatic.  Eyes: Conjunctivae are normal. Pupils are equal, round, and reactive to light.  Neck: Normal range of motion.  Cardiovascular: Normal heart sounds.   Respiratory: She is in respiratory distress.  GI: Soft.  Musculoskeletal: Normal range of motion.  Neurological: She is alert.  Skin: Skin is warm and dry.  Psychiatric: She has a normal mood and affect. Her speech is normal and behavior is normal. Judgment and thought content normal. Cognition and memory are normal.    Review of Systems  HENT: Negative.   Eyes: Negative.   Respiratory: Positive for shortness of breath.   Cardiovascular: Negative.   Gastrointestinal: Negative.   Musculoskeletal: Negative.   Skin: Negative.   Neurological: Positive for weakness.  Psychiatric/Behavioral: Negative.     Blood pressure 144/85, pulse 79, temperature 98.1 F (36.7 C), temperature source Oral, resp. rate 17, height 5' 2"  (1.575 m), weight 82.146 kg (181 lb 1.6 oz), SpO2 96 %.Body mass index is 33.12 kg/(m^2).  General Appearance: Fairly Groomed  Engineer, water::  Fair  Speech:  Normal Rate  Volume:  Decreased  Mood:  Euthymic  Affect:  Congruent  Thought Process:  Goal Directed  Orientation:   Full (Time, Place, and Person)  Thought Content:  Negative  Suicidal Thoughts:  No  Homicidal Thoughts:  No  Memory:  Immediate;   Good Recent;   Fair Remote;   Fair  Judgement:  Intact  Insight:  Fair  Psychomotor Activity:  Decreased  Concentration:  Fair  Recall:  [employer] of Knowledge:Fair  Language: Fair  Akathisia:  No  Handed:  Right  AIMS (if indicated):     Assets:  Communication Skills Desire for Improvement Intimacy Resilience  ADL's:  Intact  Cognition: WNL  Sleep:      Medical Decision Making: Established Problem, Stable/Improving (1) and Self-Limited or Minor (1)  Treatment Plan Summary: Plan no clear indication that there is a problem with her benzodiazepine use. Amounts used are very small. I don't see any clear indication and the patient does not report any concern that it has interfered with her stability or with her breathing. Education provided about the long-term risks of benzodiazepine use and the patient was encouraged to try to gradually discontinue it. This will probably need to be mostly done by her caregivers as the patient does not see it as a major problem. This can be managed in rehabilitation. All questions answered. No other treatment at this time.  Plan:  Patient does not meet criteria for psychiatric inpatient admission. Supportive therapy provided about ongoing stressors. Disposition: no psychiatric intervention. Continue with discharge planned to rehabilitation  Alethia Berthold 09/01/2014 6:23 PM

## 2014-09-01 NOTE — Progress Notes (Signed)
[facility] - [place] at [facility]   PATIENT NAME: Mindy Cook    MR#:  [id number]  DATE OF BIRTH:  August 27, 1933  SUBJECTIVE:  CHIEF COMPLAINT:   Chief Complaint  Patient presents with  . Fall   Patient presented to the hospital with a fall and also shortness of breath and noted to be in mild COPD exacerbation. Still feels quite weak, complains of bilateral lower extremity pains, especially in the right foot toe. Admits of history of gout. Feels that because of this lower extremity pain that she is not going to do well at home. She is wondering if she could qualify for rehabilitation facility. Attempted to reach physical therapy to evaluate patient today. Discussed with care management.  Admits of some dry cough. Denies sputum ,  chest pain, does admit to shortness of breath on exertion. History of chronic atrial fibrillation.  Feels better today, less pain in legs or R great toe, PT recommends SNF placement, prolonged discussion with patient's son about her condition, treatment, discharge planning, all questions answered, voiced understanding.  REVIEW OF SYSTEMS:    Review of Systems  Constitutional: Negative for fever, chills and weight loss.  HENT: Negative for congestion and tinnitus.   Eyes: Negative for blurred vision and double vision.  Respiratory: Positive for cough and shortness of breath (on exertion). Negative for sputum production and wheezing.   Cardiovascular: Negative for chest pain, palpitations, orthopnea, leg swelling and PND.  Gastrointestinal: Negative for nausea, vomiting, abdominal pain, diarrhea, constipation and blood in stool.  Genitourinary: Negative for dysuria, urgency, frequency and hematuria.  Musculoskeletal: Negative for falls.  Neurological: Positive for weakness (generalized). Negative for dizziness, tremors, sensory change, focal weakness and headaches.  Endo/Heme/Allergies: Does not bruise/bleed easily.  Psychiatric/Behavioral:  Negative for depression. The patient does not have insomnia.   All other systems reviewed and are negative.  admits of lower extremity pain, especially right foot toe pain  Nutrition: Heart healthy Tolerating Diet: Yes Tolerating PT: Await evaluation  DRUG ALLERGIES:   Allergies  Allergen Reactions  . Hydrocodone-Acetaminophen Nausea Only    VITALS:  Blood pressure 150/69, pulse 49, temperature 97.7 F (36.5 C), temperature source Oral, resp. rate 18, height 5\' 2"  (1.575 m), weight 82.146 kg (181 lb 1.6 oz), SpO2 94 %.  PHYSICAL EXAMINATION:   Physical Exam  GENERAL:  81 y.o.-year-old patient lying in the bed with no acute distress.  EYES: Pupils equal, round, reactive to light and accommodation. No scleral icterus. Extraocular muscles intact.  HEENT: Head atraumatic, normocephalic. Oropharynx and nasopharynx clear.  NECK:  Supple, no jugular venous distention. No thyroid enlargement, no tenderness.  LUNGS: Poor respiratory effort, no wheezing, rales, rhonchi. No use of accessory muscles of respiration. Diminished breath sounds bilaterally posteriorly as well as anteriorly, crackles all lung fields bilaterally posteriorly.  CARDIOVASCULAR: S1, S2 normal. 2/6 systolic ejection murmur at the right sternal border, rubs, or gallops.  ABDOMEN: Soft, nontender, nondistended. Bowel sounds present. No organomegaly or mass.  EXTREMITIES: No cyanosis, clubbing, +1 edema bilaterally.  NEUROLOGIC: Cranial nerves II through XII are intact. No focal Motor or sensory deficits b/l.  Globally weak, has difficulty sitting up in bed PSYCHIATRIC: The patient is alert and oriented x 3. Good affect SKIN: No obvious rash, lesion, or ulcer.  No more warmth of the right foot toe, no swelling or ulceration noted, full range of movements, no pain on manipulation of her foot or toe  Chronic lower extremity swelling was noted bilaterally with  chronic skin changes in bilateral calves. Ulceration of third right  toe plantar surface was noted, no discharge, swelling or infection was noticed around that area  LABORATORY PANEL:   CBC  Recent Labs Lab 08/31/14 0631  WBC 8.6  HGB 10.0*  HCT 30.9*  PLT 140*   ------------------------------------------------------------------------------------------------------------------  Chemistries   Recent Labs Lab 08/28/14 1711  09/01/14 0551  NA 142  < > 140  K 4.2  < > 3.8  CL 91*  < > 87*  CO2 45*  < > 44*  GLUCOSE 137*  < > 269*  BUN 33*  < > 44*  CREATININE 1.11*  < > 1.13*  CALCIUM 9.7  < > 9.9  AST 18  --   --   ALT 16  --   --   ALKPHOS 61  --   --   BILITOT 0.4  --   --   < > = values in this interval not displayed. ------------------------------------------------------------------------------------------------------------------  Cardiac Enzymes  Recent Labs Lab 08/28/14 1711  TROPONINI 0.03   ------------------------------------------------------------------------------------------------------------------  RADIOLOGY:  US Venous Img Lower Bilateral  08/31/2014   CLINICAL DATA:  Lower extremity swelling for 2 days.  EXAM: BILATERAL LOWER EXTREMITY VENOUS DOPPLER ULTRASOUND  TECHNIQUE: Gray-scale sonography with graded compression, as well as color Doppler and duplex ultrasound were performed to evaluate the lower extremity deep venous systems from the level of the common femoral vein and including the common femoral, femoral, profunda femoral, popliteal and calf veins including the posterior tibial, peroneal and gastrocnemius veins when visible. The superficial great saphenous vein was also interrogated. Spectral Doppler was utilized to evaluate flow at rest and with distal augmentation maneuvers in the common femoral, femoral and popliteal veins.  COMPARISON:  None.  FINDINGS: RIGHT LOWER EXTREMITY  Common Femoral Vein: No evidence of thrombus. Normal compressibility, respiratory phasicity and response to augmentation.  Saphenofemoral  Junction: No evidence of thrombus. Normal compressibility and flow on color Doppler imaging.  Profunda Femoral Vein: No evidence of thrombus. Normal compressibility and flow on color Doppler imaging.  Femoral Vein: No evidence of thrombus. Normal compressibility, respiratory phasicity and response to augmentation.  Popliteal Vein: No evidence of thrombus. Normal compressibility, respiratory phasicity and response to augmentation.  Calf Veins: No evidence of thrombus. Normal compressibility and flow on color Doppler imaging. Peroneal vein not well visualized.  Superficial Great Saphenous Vein: No evidence of thrombus. Normal compressibility and flow on color Doppler imaging.  Venous Reflux:  None.  Other Findings:  None.  LEFT LOWER EXTREMITY  Common Femoral Vein: No evidence of thrombus. Normal compressibility, respiratory phasicity and response to augmentation.  Saphenofemoral Junction: No evidence of thrombus. Normal compressibility and flow on color Doppler imaging.  Profunda Femoral Vein: No evidence of thrombus. Normal compressibility and flow on color Doppler imaging.  Femoral Vein: There is nonocclusive thrombus in the distal superficial femoral vein extending the popliteal vein.  Popliteal Vein: Nonocclusive thrombus noted in the popliteal vein.  Calf Veins: No evidence of thrombus. Normal compressibility and flow on color Doppler imaging. Peroneal vein not well visualized.  Superficial Great Saphenous Vein: No evidence of thrombus. Normal compressibility and flow on color Doppler imaging.  Venous Reflux:  None.  Other Findings:  None.  IMPRESSION: 1. Nonocclusive thrombus evident in the left distal superficial femoral vein and the popliteal vein. No other evidence of deep venous thrombosis on the left. 2. No evidence of deep venous thrombosis in the right lower extremity.   Electronically Signed  By: Amie Portland M.D.   On: 08/31/2014 15:59     ASSESSMENT AND PLAN:   81 year old female with past  medical history of diabetes, hypertension, anxiety, history of CHF, hyperlipidemia, obstructive sleep apnea, history of previous DVT, COPD, chronic anemia, who presented to the hospital with shortness of breath and a fall and noted to be in mild COPD exacerbation. * Acute on chronic respiratory failure with hypoxia, likely due to mild COPD exacerbation, continue current management. Patient was improving overall, but today, she is on 3 L of oxygen through nasal cannula with baseline being 2 L. Over. I can hear crackles in her lung fields concerning for congestive heart failure exacerbation. We are going to get chest x-ray repeated, and the reinitiate patient on Zaroxolyn if needed. Discontinue Rocephin, IV, continue Zithromax orally to complete course. Discussed with patient's son, all questions answered voiced understanding  #1 generalized weakness/fall-this is probably related to patient's deconditioning, underlying UTI and mild COPD/hypoxia. Initially evaluated by physical therapist recommended home health physical therapy. However, had gout exacerbation, reevaluated by physical therapist today and skilled nursing facility rehabilitation placement was recommended. We will consult social workers to help [place] with placement. Discussed with patient's son extensively  #2 UTI-continue IV ceftriaxone , urine cultures not reported.  #3 COPD with mild acute exacerbation-continue Zithromax,  Dulera, salmeterol,  DuoNeb nebs as needed. .  -Patient is already on oxygen at home.Clinically he was improving over the past few days. However, today she is on  3 L of oxygen  through nasal cannulas, getting chest x-ray repeated to rule out fluid retention  #4 history of previous DVT-continue Coumadin. INR was subtherapeutic on admission, therapeutic today with INR of 2.4.  #5 restless leg syndrome-continue Requip.  #6 type 2 diabetes without complication-continue sliding scale insulin, patient's glucose levels are high  due to steroids now. We will be tapering them.  #7 hyperlipidemia-continue Pravachol.  #8 diabetic neuropathy-continue Neurontin.  #9  Constipation. Continue patient on Senokot , add MiraLAX.    #10 . Gout exacerbation in the right lower extremity great toe. Improved on Solu-Medrol . Over the past 24 hours now. We will be tapering steroids with Medrol dosepak. Patient may benefit from allopurinol whenever gout attack is over. Checking uric acid level.   All the records are reviewed and case discussed with Care Management/Social Workerr. Management plans discussed with the patient, family and they are in agreement.  CODE STATUS: Full  DVT Prophylaxis: Warfarin  TOTAL TIME TAKING CARE OF THIS PATIENT: 50 minutes.  Discussed this patient's son face-to-face for approximately 15-20 minutes about patient's medical condition, treatment plan and discharge planning. All questions were answered. Patient's son voiced understanding and appreciation POSSIBLE D/C IN 2-3 DAYS, DEPENDING ON CLINICAL CONDITION.   Katharina Caper M.D on 09/01/2014 at 12:27 PM  Between 7am to 6pm - Pager - [phone]  After 6pm go to [email or web address] - password [email or web address] [facility]  [place] [facility]  Office  [phone]  CC: Primary care physician; Patrice Paradise, MD

## 2014-09-01 NOTE — Progress Notes (Signed)
CSW confirmed SNF bed at [facility]. CSW prepared dc packet and sent dc summary to SNF for review. CSW updated [name] on the phone.    No further CSW needs at this time.   Wilford Grist, LCSW [phone]

## 2014-09-01 NOTE — Progress Notes (Signed)
Physical Therapy Treatment Patient Details Name: Mindy Cook MRN: [id number] DOB: 16-Sep-1933 Today's Date: 09/01/2014    History of Present Illness Pt is a 81yo white F c Hx: COPD on 2L at home, who reports gradual decline in energy level and increasing desaturation. Pt experienced a fall and hit head. Husband reports her O2sats to be in 49's. Pt arived at [facility], found to be in likely COPD exacerbation and have a UTI.     PT Comments    Pt tolerating treatment session well, motivated and able to complete most PT sesssion as planned, but limited activity tolerance to ambulation secondary to pain in feet c walking. Pt is also demonstrating difficulty maintaining sats at baseline levels, including nasal congestion that is making use of [place] difficult. Pt not able to show progress toward goals as evidenced by continued limits in activity tolerance and O2 desaturation. Pt's greatest limitation continues to be weakness in BLE which continues to limit ability to perform household distance mobility at baseline function. Patient presenting with impairment of strength, pain, oxygen perfusion, and activity tolerance, limiting ability to perform ADL and mobility tasks at  baseline level of function. Patient will benefit from skilled intervention to address the above impairments and limitations, in order to restore to prior level of function, improve patient safety upon discharge, and to decrease caregiver burden.    Follow Up Recommendations  SNF;Supervision for mobility/OOB     Equipment Recommendations  None recommended by PT    Recommendations for Other Services       Precautions / Restrictions Precautions Precautions: None Restrictions Weight Bearing Restrictions: No    Mobility  Bed Mobility Overal bed mobility: Needs Assistance Bed Mobility: Supine to Sit     Supine to sit: Mod assist (can perform 80% of transistion at supervision, but has difficulty coming comletely erect on EOB. )        Transfers Overall transfer level: Needs assistance Equipment used: Rolling walker (2 wheeled) (would benefit from pediatric RW. ) Transfers: Sit to/from Stand (3x ) Sit to Stand: Min guard            Ambulation/Gait Ambulation/Gait assistance: Min guard Ambulation Distance (Feet): 20 Feet (2x52ft c rest between.) Assistive device: Rolling walker (2 wheeled) Gait Pattern/deviations: Decreased step length - right;Decreased step length - left;Decreased weight shift to right;Decreased weight shift to left   Gait velocity interpretation: <1.8 ft/sec, indicative of risk for recurrent falls General Gait Details: very labored and weak.    Stairs            Wheelchair Mobility    Modified Rankin (Stroke Patients Only)       Balance Overall balance assessment: Modified Independent;No apparent balance deficits (not formally assessed);History of Falls                                  Cognition Arousal/Alertness: Awake/alert Behavior During Therapy: WFL for tasks assessed/performed Overall Cognitive Status: Within Functional Limits for tasks assessed                      Exercises      General Comments        Pertinent Vitals/Pain Pain Assessment:  (reports a mild HA upon entry which she reports is better once seated EOB; C/O pain in feet c AMB which is reported as a chronic problem. )    Home Living  Prior Function            PT Goals (current goals can now be found in the care plan section) Acute Rehab PT Goals Patient Stated Goal: Improve balance and activity tolerance for AMB in house.  PT Goal Formulation: With patient Time For Goal Achievement: 09/12/14 Potential to Achieve Goals: Good Progress towards PT goals: Not progressing toward goals - comment    Frequency  Min 2X/week    PT Plan Discharge plan needs to be updated (updates on baseline level of function obtained from son/daughtyer. Pt  was previously more indep than what pt inititally reported at eval. )    Co-evaluation             End of Session Equipment Utilized During Treatment: Gait belt Activity Tolerance: Patient tolerated treatment well;Patient limited by fatigue;Patient limited by pain Patient left: in chair;with call bell/phone within reach;with chair alarm set;with family/visitor present     Time: 1610-9604 PT Time Calculation (min) (ACUTE ONLY): 23 min  Charges:  $Therapeutic Activity: 23-37 mins                    G Codes:      Ryatt Corsino C 21-Sep-2014, 9:26 AM 9:28 AM  Rosamaria Lints, PT, DPT [place] License # [id number]

## 2014-09-01 NOTE — Clinical Social Work Placement (Signed)
   CLINICAL SOCIAL WORK PLACEMENT  NOTE  Date:  09/01/2014  Patient Details  Name: Mindy Cook MRN: [id number] Date of Birth: 1933/06/20  Clinical Social Work is seeking post-discharge placement for this patient at the Skilled  Nursing Facility level of care (*CSW will initial, date and re-position this form in  chart as items are completed):  Yes   Patient/family provided with [facility] Clinical Social Work Department's list of facilities offering this level of care within the geographic area requested by the patient (or if unable, by the patient's family).  Yes   Patient/family informed of their freedom to choose among providers that offer the needed level of care, that participate in Medicare, Medicaid or managed care program needed by the patient, have an available bed and are willing to accept the patient.  Yes   Patient/family informed of [facility] ownership interest in [facility] and [facility], as well as of the fact that they are under no obligation to receive care at these facilities.  PASRR submitted to EDS on       PASRR number received on       Existing PASRR number confirmed on 09/01/14     FL2 transmitted to all facilities in geographic area requested by pt/family on 09/01/14     FL2 transmitted to all facilities within larger geographic area on       Patient informed that his/her managed care company has contracts with or will negotiate with certain facilities, including the following:        Yes   Patient/family informed of bed offers received.  Patient chooses bed at  [facility])     Physician recommends and patient chooses bed at      Patient to be transferred to [facility] on 09/01/14.  Patient to be transferred to facility by EMS     Patient family notified on 09/01/14 of transfer.  Name of family member notified:  daughter Selena Batten     PHYSICIAN       Additional Comment:     _______________________________________________ Ned Card, LCSW 09/01/2014, 6:34 PM

## 2014-09-01 NOTE — Clinical Social Work Note (Signed)
Clinical Social Work Assessment  Patient Details  Name: Mindy Cook MRN: [id number] Date of Birth: 01-Jul-1933  Date of referral:  09/01/14               Reason for consult:  Facility Placement                Permission sought to share information with:  Facility Sport and exercise psychologist, Family Supports Permission granted to share information::  Yes, Verbal Permission Granted  Name::        Agency::  SNFs  Relationship::  daughter  Contact Information:     Housing/Transportation Living arrangements for the past 2 months:  Single Family Home Source of Information:  Patient, Adult Children Patient Interpreter Needed:  None Criminal Activity/Legal Involvement Pertinent to Current Situation/Hospitalization:  No - Comment as needed Significant Relationships:  Adult Children, Spouse Lives with:  Spouse Do you feel safe going back to the place where you live?  Yes Need for family participation in patient care:  Yes (Comment)  Care giving concerns:  Pt lives at home with her husband who is elderly and has medical issues as well. Pt's 2 children live in [place] and [place]    Social Worker assessment / plan:  CSW met with Pt who reports that she is married and has 2 adult children who are supportive and engaged with Pt. CSW discussed dc planning with Pt while no family was at bedside. Pt is aware of the recommendation of SNF and is agreeable to dc plan to SNF today. Pt reports that she has been to [facility] in the past and would like to go again today. CSW will begin SNF bed search to prepare dc today.   Employment status:  Retired Nurse, adult PT Recommendations:  [place] / Referral to community resources:  [place]  Patient/Family's Response to care:  Pt is agreeable to SNF at [facility]. Pt reports feeling weaker than normal and wants to get stronger before going home.   Patient/Family's Understanding of and  Emotional Response to Diagnosis, Current Treatment, and Prognosis:  Pt has been updating husband and wife on the phone she is going to go to SNF via ems.    Emotional Assessment Appearance:  Appears stated age Attitude/Demeanor/Rapport:   (cooperative) Affect (typically observed):  Accepting Orientation:  Oriented to Self, Oriented to Place, Oriented to  Time, Oriented to Situation Alcohol / Substance use:  Never Used Psych involvement (Current and /or in the community):  Yes (Comment) (ordered for anxiety at home)  Discharge Needs  Concerns to be addressed:  Adjustment to Illness, Discharge Planning Concerns Readmission within the last 30 days:  No Current discharge risk:  Dependent with Mobility, Physical Impairment Barriers to Discharge:  Barriers Resolved   Alonna Buckler, LCSW 09/01/2014, 5:49 PM

## 2014-09-01 NOTE — Progress Notes (Signed)
ANTICOAGULATION CONSULT NOTE - FOLLOW UP   Pharmacy Consult for warfarin Indication: atrial fibrillation  Allergies  Allergen Reactions  . Hydrocodone-Acetaminophen Nausea Only    Patient Measurements: Height:  (157.5 cm) Weight: 181 lb 1.6 oz (82.146 kg) IBW/kg (Calculated) : 50.1 Heparin Dosing Weight:   Vital Signs: Temp: 97.7 F (36.5 C) (08/22 0757) Temp Source: Oral (08/22 0757) BP: 137/66 mmHg (08/22 0757) Pulse Rate: 125 (08/22 0917)  Labs:  Recent Labs  08/30/14 0445 08/31/14 0631 09/01/14 0551  HGB  --  10.0*  --   HCT  --  30.9*  --   PLT  --  140*  --   LABPROT 24.6* 23.9* 26.4*  INR 2.20 2.12 2.41  CREATININE 1.11*  --  1.13*    Estimated Creatinine Clearance: 40.1 mL/min (by C-G formula based on Cr of 1.13).   Medical History: Past Medical History  Diagnosis Date  . Asthma   . A-fib   . Arthritis   . Aortic stenosis   . Aortic insufficiency   . Hyperlipidemia   . Hypertension   . Sleep apnea   . CHF (congestive heart failure)   . History of DVT (deep vein thrombosis)   . Diabetes mellitus type 2, uncontrolled   . COPD (chronic obstructive pulmonary disease)   . Cervical spondylolysis   . DDD (degenerative disc disease), cervical   . Anemia   . Mitral valve disorder     Medications:  Infusions:    Assessment: Phamacy consulted to dose warfarin for 81 yo female admitted with likely AECOPD. Patient has history significant for Afib and DVT. Goal INR 2-3. Patient is receiving ceftriaxone and azithromycin in house and had has been continued on ropinirole; all of which can interact with warfarin. Patient received home dose of warfarin on 8/19, but appears to not have received dose on 8/18.  Home dose= Warfarin 5 mg daily.  INR today of 2.41  Goal of Therapy:  INR 2-3 Monitor platelets by anticoagulation protocol: Yes   Plan:  Patient's INR in range goal range today. Will continue lower warfarin dose of  daily while on ABX.  Will obtain follow-up INR with am labs.    Pharmacy will continue to monitor and adjust per consult.    Rakesh Dutko G, Pharm.D. Clinical Pharmacist 09/01/2014,9:20 AM

## 2014-09-01 NOTE — Progress Notes (Addendum)
Inpatient Diabetes Program Recommendations  AACE/ADA: New Consensus Statement on Inpatient Glycemic Control (2013)  Target Ranges:  Prepandial:   less than 140 mg/dL      Peak postprandial:   less than 180 mg/dL (1-2 hours)      Critically ill patients:  140 - 180 mg/dL   Review of Glycemic Control:  Results for Mindy Cook, Mindy Cook (MRN [id number]) as of 09/01/2014 10:09  Ref. Range 08/31/2014 21:36 08/31/2014 22:22 09/01/2014 00:23 09/01/2014 02:09 09/01/2014 07:55  Glucose-Capillary Latest Ref Range: 65-99 mg/dL 829 (H) 562 (H) 130 (H) 363 (H) 265 (H)    Results for Mindy Cook, [place] (MRN [id number]) as of 09/01/2014 10:09  Ref. Range 08/28/2014 17:11  Hemoglobin A1C Latest Ref Range: 4.0-6.0 % 7.4 (H)    Diabetes history: Type 2 diabetes Outpatient Diabetes medications: Amaryl 1 mg bid Current orders for Inpatient glycemic control:  Novolog moderate tid with  Meals and HS  Note that CBG's are increased with IV steroids.  Please consider adding Levemir 16 units daily.  Also consider adding Novolog meal coverage 4 units tid with meals (hold if patient eats less than 50%).  Please change bedtime coverage to HS scale which starts at 201 mg/dL.  As steroids are weaned, insulin may also be decreased.  A1C does not indicate that patient will need insulin at home.  Thanks, Beryl Meager, RN, BC-ADM Inpatient Diabetes Coordinator Pager [phone] (8a-5p)

## 2014-09-16 ENCOUNTER — Inpatient Hospital Stay
Admission: EM | Admit: 2014-09-16 | Discharge: 2014-09-18 | DRG: 189 | Disposition: A | Discharge status: Skilled Nursing Facility | Payer: Medicare Other | Attending: Internal Medicine | Admitting: Internal Medicine

## 2014-09-16 ENCOUNTER — Encounter: Payer: Self-pay | Admitting: Emergency Medicine

## 2014-09-16 ENCOUNTER — Emergency Department: Payer: Medicare Other

## 2014-09-16 DIAGNOSIS — G4730 Sleep apnea, unspecified: Secondary | ICD-10-CM | POA: Diagnosis present

## 2014-09-16 DIAGNOSIS — Z6831 Body mass index (BMI) 31.0-31.9, adult: Secondary | ICD-10-CM

## 2014-09-16 DIAGNOSIS — Z23 Encounter for immunization: Secondary | ICD-10-CM | POA: Insufficient documentation

## 2014-09-16 DIAGNOSIS — J9621 Acute and chronic respiratory failure with hypoxia: Principal | ICD-10-CM | POA: Diagnosis present

## 2014-09-16 DIAGNOSIS — G2581 Restless legs syndrome: Secondary | ICD-10-CM | POA: Diagnosis present

## 2014-09-16 DIAGNOSIS — I248 Other forms of acute ischemic heart disease: Secondary | ICD-10-CM | POA: Diagnosis present

## 2014-09-16 DIAGNOSIS — I4891 Unspecified atrial fibrillation: Secondary | ICD-10-CM | POA: Diagnosis present

## 2014-09-16 DIAGNOSIS — R7989 Other specified abnormal findings of blood chemistry: Secondary | ICD-10-CM

## 2014-09-16 DIAGNOSIS — N170 Acute kidney failure with tubular necrosis: Secondary | ICD-10-CM | POA: Diagnosis present

## 2014-09-16 DIAGNOSIS — R778 Other specified abnormalities of plasma proteins: Secondary | ICD-10-CM

## 2014-09-16 DIAGNOSIS — J440 Chronic obstructive pulmonary disease with acute lower respiratory infection: Secondary | ICD-10-CM | POA: Diagnosis present

## 2014-09-16 DIAGNOSIS — I080 Rheumatic disorders of both mitral and aortic valves: Secondary | ICD-10-CM | POA: Diagnosis present

## 2014-09-16 DIAGNOSIS — Z8249 Family history of ischemic heart disease and other diseases of the circulatory system: Secondary | ICD-10-CM | POA: Diagnosis not present

## 2014-09-16 DIAGNOSIS — E440 Moderate protein-calorie malnutrition: Secondary | ICD-10-CM | POA: Diagnosis present

## 2014-09-16 DIAGNOSIS — E871 Hypo-osmolality and hyponatremia: Secondary | ICD-10-CM | POA: Diagnosis present

## 2014-09-16 DIAGNOSIS — Z803 Family history of malignant neoplasm of breast: Secondary | ICD-10-CM | POA: Diagnosis not present

## 2014-09-16 DIAGNOSIS — N390 Urinary tract infection, site not specified: Secondary | ICD-10-CM | POA: Diagnosis present

## 2014-09-16 DIAGNOSIS — Z86718 Personal history of other venous thrombosis and embolism: Secondary | ICD-10-CM

## 2014-09-16 DIAGNOSIS — Z79899 Other long term (current) drug therapy: Secondary | ICD-10-CM | POA: Diagnosis not present

## 2014-09-16 DIAGNOSIS — L8990 Pressure ulcer of unspecified site, unspecified stage: Secondary | ICD-10-CM | POA: Insufficient documentation

## 2014-09-16 DIAGNOSIS — R0902 Hypoxemia: Secondary | ICD-10-CM | POA: Diagnosis present

## 2014-09-16 DIAGNOSIS — J810 Acute pulmonary edema: Secondary | ICD-10-CM | POA: Diagnosis not present

## 2014-09-16 DIAGNOSIS — L89159 Pressure ulcer of sacral region, unspecified stage: Secondary | ICD-10-CM | POA: Diagnosis present

## 2014-09-16 DIAGNOSIS — M5030 Other cervical disc degeneration, unspecified cervical region: Secondary | ICD-10-CM | POA: Diagnosis present

## 2014-09-16 DIAGNOSIS — J45901 Unspecified asthma with (acute) exacerbation: Secondary | ICD-10-CM | POA: Diagnosis present

## 2014-09-16 DIAGNOSIS — Z8672 Personal history of thrombophlebitis: Secondary | ICD-10-CM

## 2014-09-16 DIAGNOSIS — J209 Acute bronchitis, unspecified: Secondary | ICD-10-CM | POA: Diagnosis present

## 2014-09-16 DIAGNOSIS — Z9889 Other specified postprocedural states: Secondary | ICD-10-CM

## 2014-09-16 DIAGNOSIS — Z885 Allergy status to narcotic agent status: Secondary | ICD-10-CM

## 2014-09-16 DIAGNOSIS — E1165 Type 2 diabetes mellitus with hyperglycemia: Secondary | ICD-10-CM | POA: Diagnosis present

## 2014-09-16 DIAGNOSIS — Z90710 Acquired absence of both cervix and uterus: Secondary | ICD-10-CM

## 2014-09-16 DIAGNOSIS — M1A9XX0 Chronic gout, unspecified, without tophus (tophi): Secondary | ICD-10-CM | POA: Diagnosis present

## 2014-09-16 DIAGNOSIS — N189 Chronic kidney disease, unspecified: Secondary | ICD-10-CM | POA: Diagnosis present

## 2014-09-16 DIAGNOSIS — Z7901 Long term (current) use of anticoagulants: Secondary | ICD-10-CM

## 2014-09-16 DIAGNOSIS — I129 Hypertensive chronic kidney disease with stage 1 through stage 4 chronic kidney disease, or unspecified chronic kidney disease: Secondary | ICD-10-CM | POA: Diagnosis present

## 2014-09-16 DIAGNOSIS — I5042 Chronic combined systolic (congestive) and diastolic (congestive) heart failure: Secondary | ICD-10-CM | POA: Diagnosis present

## 2014-09-16 HISTORY — DX: Chronic gout, unspecified, without tophus (tophi): M1A.9XX0

## 2014-09-16 HISTORY — DX: Unspecified osteoarthritis, unspecified site: M19.90

## 2014-09-16 HISTORY — DX: Restless legs syndrome: G25.81

## 2014-09-16 HISTORY — DX: Anxiety disorder, unspecified: F41.9

## 2014-09-16 LAB — URINALYSIS COMPLETE WITH MICROSCOPIC (ARMC ONLY)
BILIRUBIN URINE: NEGATIVE
Glucose, UA: NEGATIVE mg/dL
HGB URINE DIPSTICK: NEGATIVE
KETONES UR: NEGATIVE mg/dL
NITRITE: NEGATIVE
PH: 6 (ref 5.0–8.0)
PROTEIN: NEGATIVE mg/dL
SPECIFIC GRAVITY, URINE: 1.009 (ref 1.005–1.030)

## 2014-09-16 LAB — CBC
HCT: 34 % — ABNORMAL LOW (ref 35.0–47.0)
HEMOGLOBIN: 11.1 g/dL — AB (ref 12.0–16.0)
MCH: 29 pg (ref 26.0–34.0)
MCHC: 32.6 g/dL (ref 32.0–36.0)
MCV: 89 fL (ref 80.0–100.0)
Platelets: 267 10*3/uL (ref 150–440)
RBC: 3.82 MIL/uL (ref 3.80–5.20)
RDW: 15.9 % — ABNORMAL HIGH (ref 11.5–14.5)
WBC: 15.2 10*3/uL — ABNORMAL HIGH (ref 3.6–11.0)

## 2014-09-16 LAB — GLUCOSE, CAPILLARY
Glucose-Capillary: 224 mg/dL — ABNORMAL HIGH (ref 65–99)
Glucose-Capillary: 153 mg/dL — ABNORMAL HIGH (ref 65–99)
Glucose-Capillary: 185 mg/dL — ABNORMAL HIGH (ref 65–99)
Glucose-Capillary: 245 mg/dL — ABNORMAL HIGH (ref 65–99)

## 2014-09-16 LAB — BASIC METABOLIC PANEL
Anion gap: 14 (ref 5–15)
BUN: 83 mg/dL — ABNORMAL HIGH (ref 6–20)
CALCIUM: 10.1 mg/dL (ref 8.9–10.3)
CO2: 36 mmol/L — AB (ref 22–32)
CREATININE: 1.78 mg/dL — AB (ref 0.44–1.00)
Chloride: 83 mmol/L — ABNORMAL LOW (ref 101–111)
GFR calc non Af Amer: 26 mL/min — ABNORMAL LOW (ref >60)
GFR, EST AFRICAN AMERICAN: 30 mL/min — AB (ref >60)
GLUCOSE: 214 mg/dL — AB (ref 65–99)
Potassium: 4.2 mmol/L (ref 3.5–5.1)
Sodium: 133 mmol/L — ABNORMAL LOW (ref 135–145)

## 2014-09-16 LAB — TROPONIN I
TROPONIN I: 0.05 ng/mL — AB (ref <0.031)
TROPONIN I: 0.06 ng/mL — AB (ref <0.031)

## 2014-09-16 LAB — TSH: TSH: 1.707 u[IU]/mL (ref 0.350–4.500)

## 2014-09-16 LAB — BRAIN NATRIURETIC PEPTIDE: B NATRIURETIC PEPTIDE 5: 240 pg/mL — AB (ref 0.0–100.0)

## 2014-09-16 LAB — MRSA PCR SCREENING: MRSA by PCR: NEGATIVE

## 2014-09-16 MED ORDER — ZINC OXIDE 20 % EX OINT
1.0000 "application " | TOPICAL_OINTMENT | Freq: Two times a day (BID) | CUTANEOUS | Status: DC
  Administered 2014-09-16 – 2014-09-18 (×5): 1 via TOPICAL
  Filled 2014-09-16: qty 28.35

## 2014-09-16 MED ORDER — GABAPENTIN 300 MG PO CAPS
600.0000 mg | ORAL_CAPSULE | Freq: Three times a day (TID) | ORAL | Status: DC
  Administered 2014-09-16 – 2014-09-18 (×8): 600 mg via ORAL
  Filled 2014-09-16 (×8): qty 2

## 2014-09-16 MED ORDER — DOCUSATE SODIUM 100 MG PO CAPS: 100.0000 mg | ORAL_CAPSULE | Freq: Two times a day (BID) | ORAL | Status: DC

## 2014-09-16 MED ORDER — MECLIZINE HCL 25 MG PO TABS: 12.5000 mg | ORAL_TABLET | Freq: Three times a day (TID) | ORAL | Status: DC | PRN

## 2014-09-16 MED ORDER — TRAMADOL HCL 50 MG PO TABS
50.0000 mg | ORAL_TABLET | Freq: Two times a day (BID) | ORAL | Status: DC | PRN
  Administered 2014-09-17: 50 mg via ORAL
  Filled 2014-09-16: qty 1

## 2014-09-16 MED ORDER — ALPRAZOLAM 0.25 MG PO TABS
0.2500 mg | ORAL_TABLET | Freq: Every evening | ORAL | Status: DC
  Administered 2014-09-16 – 2014-09-17 (×2): 0.25 mg via ORAL
  Filled 2014-09-16 (×2): qty 1

## 2014-09-16 MED ORDER — LEVOFLOXACIN IN D5W 500 MG/100ML IV SOLN
500.0000 mg | Freq: Once | INTRAVENOUS | Status: DC
  Filled 2014-09-16: qty 100

## 2014-09-16 MED ORDER — POTASSIUM CHLORIDE CRYS ER 20 MEQ PO TBCR
20.0000 meq | EXTENDED_RELEASE_TABLET | Freq: Two times a day (BID) | ORAL | Status: DC
  Administered 2014-09-16 – 2014-09-18 (×5): 20 meq via ORAL
  Filled 2014-09-16 (×5): qty 1

## 2014-09-16 MED ORDER — LACTULOSE 10 GM/15ML PO SOLN: 10.0000 g | Freq: Two times a day (BID) | ORAL | Status: DC | PRN

## 2014-09-16 MED ORDER — POLYETHYLENE GLYCOL 3350 17 G PO PACK
17.0000 g | PACK | Freq: Every day | ORAL | Status: DC
  Administered 2014-09-16 – 2014-09-18 (×3): 17 g via ORAL
  Filled 2014-09-16 (×3): qty 1

## 2014-09-16 MED ORDER — MIRABEGRON ER 50 MG PO TB24
50.0000 mg | ORAL_TABLET | Freq: Every day | ORAL | Status: DC
  Administered 2014-09-16 – 2014-09-18 (×3): 50 mg via ORAL
  Filled 2014-09-16 (×3): qty 1

## 2014-09-16 MED ORDER — FERROUS SULFATE 325 (65 FE) MG PO TABS
325.0000 mg | ORAL_TABLET | Freq: Every day | ORAL | Status: DC
  Administered 2014-09-16 – 2014-09-18 (×3): 325 mg via ORAL
  Filled 2014-09-16 (×3): qty 1

## 2014-09-16 MED ORDER — LEVOFLOXACIN 500 MG PO TABS
500.0000 mg | ORAL_TABLET | Freq: Once | ORAL | Status: AC
  Administered 2014-09-16: 500 mg via ORAL
  Filled 2014-09-16: qty 1

## 2014-09-16 MED ORDER — DIGOXIN 125 MCG PO TABS
0.0625 mg | ORAL_TABLET | Freq: Every day | ORAL | Status: DC
  Administered 2014-09-16 – 2014-09-18 (×3): 0.0625 mg via ORAL
  Filled 2014-09-16 (×3): qty 1

## 2014-09-16 MED ORDER — HEPARIN SODIUM (PORCINE) 5000 UNIT/ML IJ SOLN
5000.0000 [IU] | Freq: Three times a day (TID) | INTRAMUSCULAR | Status: DC
  Administered 2014-09-16 – 2014-09-17 (×3): 5000 [IU] via SUBCUTANEOUS
  Filled 2014-09-16 (×3): qty 1

## 2014-09-16 MED ORDER — SENNA 8.6 MG PO TABS: 1.0000 | ORAL_TABLET | Freq: Every evening | ORAL | Status: DC | PRN

## 2014-09-16 MED ORDER — METOLAZONE 2.5 MG PO TABS: 5.0000 mg | ORAL_TABLET | ORAL | Status: DC

## 2014-09-16 MED ORDER — AZELASTINE HCL 0.1 % NA SOLN
1.0000 | Freq: Two times a day (BID) | NASAL | Status: DC
  Administered 2014-09-16 – 2014-09-18 (×5): 1 via NASAL
  Filled 2014-09-16: qty 30

## 2014-09-16 MED ORDER — SODIUM CHLORIDE 0.9 % IJ SOLN
3.0000 mL | Freq: Two times a day (BID) | INTRAMUSCULAR | Status: DC
  Administered 2014-09-16 – 2014-09-18 (×4): 3 mL via INTRAVENOUS

## 2014-09-16 MED ORDER — NITROFURANTOIN MACROCRYSTAL 50 MG PO CAPS: 50.0000 mg | ORAL_CAPSULE | Freq: Every day | ORAL | Status: DC

## 2014-09-16 MED ORDER — CLOTRIMAZOLE 1 % EX CREA
1.0000 "application " | TOPICAL_CREAM | Freq: Two times a day (BID) | CUTANEOUS | Status: DC
  Administered 2014-09-16 – 2014-09-17 (×4): 1 via TOPICAL
  Filled 2014-09-16: qty 15

## 2014-09-16 MED ORDER — FLUTICASONE PROPIONATE 50 MCG/ACT NA SUSP: 1.0000 | Freq: Every day | NASAL | Status: DC

## 2014-09-16 MED ORDER — CRANBERRY 250 MG PO TABS: 500.0000 mg | ORAL_TABLET | Freq: Every day | ORAL | Status: DC

## 2014-09-16 MED ORDER — MOMETASONE FURO-FORMOTEROL FUM 100-5 MCG/ACT IN AERO
2.0000 | INHALATION_SPRAY | Freq: Two times a day (BID) | RESPIRATORY_TRACT | Status: DC
  Administered 2014-09-16 – 2014-09-18 (×5): 2 via RESPIRATORY_TRACT
  Filled 2014-09-16: qty 8.8

## 2014-09-16 MED ORDER — PREDNISONE 50 MG PO TABS
50.0000 mg | ORAL_TABLET | Freq: Every day | ORAL | Status: DC
  Administered 2014-09-17 – 2014-09-18 (×2): 50 mg via ORAL
  Filled 2014-09-16 (×2): qty 1

## 2014-09-16 MED ORDER — LEVOFLOXACIN 250 MG PO TABS: 250.0000 mg | ORAL_TABLET | Freq: Every day | ORAL | Status: DC

## 2014-09-16 MED ORDER — SODIUM CHLORIDE 0.9 % IV SOLN
INTRAVENOUS | Status: DC
  Administered 2014-09-16 – 2014-09-17 (×2): via INTRAVENOUS

## 2014-09-16 MED ORDER — IPRATROPIUM-ALBUTEROL 0.5-2.5 (3) MG/3ML IN SOLN
3.0000 mL | RESPIRATORY_TRACT | Status: DC
  Administered 2014-09-16 – 2014-09-18 (×11): 3 mL via RESPIRATORY_TRACT
  Filled 2014-09-16 (×12): qty 3

## 2014-09-16 MED ORDER — LEVOFLOXACIN 500 MG PO TABS: 500.0000 mg | ORAL_TABLET | Freq: Every day | ORAL | Status: DC

## 2014-09-16 MED ORDER — DOCUSATE SODIUM 100 MG PO CAPS
100.0000 mg | ORAL_CAPSULE | Freq: Two times a day (BID) | ORAL | Status: DC
  Administered 2014-09-16 – 2014-09-18 (×5): 100 mg via ORAL
  Filled 2014-09-16 (×5): qty 1

## 2014-09-16 MED ORDER — ASPIRIN 325 MG PO TABS
325.0000 mg | ORAL_TABLET | Freq: Every day | ORAL | Status: DC
  Administered 2014-09-16 – 2014-09-17 (×2): 325 mg via ORAL
  Filled 2014-09-16 (×2): qty 1

## 2014-09-16 MED ORDER — ACETAMINOPHEN 650 MG RE SUPP
650.0000 mg | Freq: Four times a day (QID) | RECTAL | Status: DC | PRN
Start: 2014-09-16 — End: 2014-09-18

## 2014-09-16 MED ORDER — FUROSEMIDE 10 MG/ML IJ SOLN
40.0000 mg | Freq: Once | INTRAMUSCULAR | Status: DC
  Filled 2014-09-16: qty 4

## 2014-09-16 MED ORDER — MONTELUKAST SODIUM 10 MG PO TABS
10.0000 mg | ORAL_TABLET | Freq: Every day | ORAL | Status: DC
  Administered 2014-09-16 – 2014-09-18 (×3): 10 mg via ORAL
  Filled 2014-09-16 (×3): qty 1

## 2014-09-16 MED ORDER — LINAGLIPTIN 5 MG PO TABS
5.0000 mg | ORAL_TABLET | Freq: Every day | ORAL | Status: DC
  Administered 2014-09-16 – 2014-09-18 (×3): 5 mg via ORAL
  Filled 2014-09-16 (×3): qty 1

## 2014-09-16 MED ORDER — GABAPENTIN 300 MG PO CAPS
300.0000 mg | ORAL_CAPSULE | Freq: Every day | ORAL | Status: DC
  Administered 2014-09-16 – 2014-09-17 (×2): 300 mg via ORAL
  Filled 2014-09-16 (×2): qty 1

## 2014-09-16 MED ORDER — FUROSEMIDE 40 MG PO TABS
40.0000 mg | ORAL_TABLET | Freq: Two times a day (BID) | ORAL | Status: DC
  Administered 2014-09-16: 40 mg via ORAL
  Filled 2014-09-16: qty 1

## 2014-09-16 MED ORDER — PROMETHAZINE HCL 25 MG PO TABS: 12.5000 mg | ORAL_TABLET | Freq: Three times a day (TID) | ORAL | Status: DC | PRN

## 2014-09-16 MED ORDER — ALLOPURINOL 100 MG PO TABS
100.0000 mg | ORAL_TABLET | Freq: Every day | ORAL | Status: DC
  Administered 2014-09-16 – 2014-09-18 (×3): 100 mg via ORAL
  Filled 2014-09-16 (×3): qty 1

## 2014-09-16 MED ORDER — ROPINIROLE HCL 0.25 MG PO TABS
0.2500 mg | ORAL_TABLET | Freq: Every day | ORAL | Status: DC
  Administered 2014-09-16 – 2014-09-17 (×2): 0.25 mg via ORAL
  Filled 2014-09-16 (×3): qty 1

## 2014-09-16 MED ORDER — ACETAMINOPHEN 325 MG PO TABS
650.0000 mg | ORAL_TABLET | Freq: Four times a day (QID) | ORAL | Status: DC | PRN
  Administered 2014-09-16 – 2014-09-18 (×4): 650 mg via ORAL
  Filled 2014-09-16 (×4): qty 2

## 2014-09-16 MED ORDER — INSULIN ASPART 100 UNIT/ML ~~LOC~~ SOLN
0.0000 [IU] | Freq: Three times a day (TID) | SUBCUTANEOUS | Status: DC
  Administered 2014-09-16: 3 [IU] via SUBCUTANEOUS
  Administered 2014-09-16: 5 [IU] via SUBCUTANEOUS
  Administered 2014-09-16: 3 [IU] via SUBCUTANEOUS
  Administered 2014-09-17: 8 [IU] via SUBCUTANEOUS
  Administered 2014-09-17 – 2014-09-18 (×2): 2 [IU] via SUBCUTANEOUS
  Administered 2014-09-18: 11 [IU] via SUBCUTANEOUS
  Filled 2014-09-16: qty 2
  Filled 2014-09-16: qty 3
  Filled 2014-09-16: qty 11
  Filled 2014-09-16: qty 8
  Filled 2014-09-16: qty 3
  Filled 2014-09-16: qty 2
  Filled 2014-09-16: qty 5

## 2014-09-16 MED ORDER — RISAQUAD PO CAPS
1.0000 | ORAL_CAPSULE | Freq: Two times a day (BID) | ORAL | Status: DC
  Administered 2014-09-16 – 2014-09-18 (×5): 1 via ORAL
  Filled 2014-09-16 (×5): qty 1

## 2014-09-16 MED ORDER — ONDANSETRON HCL 4 MG/2ML IJ SOLN
4.0000 mg | Freq: Four times a day (QID) | INTRAMUSCULAR | Status: DC | PRN
Start: 2014-09-16 — End: 2014-09-18

## 2014-09-16 MED ORDER — INSULIN ASPART 100 UNIT/ML ~~LOC~~ SOLN
0.0000 [IU] | Freq: Every day | SUBCUTANEOUS | Status: DC
  Administered 2014-09-16: 2 [IU] via SUBCUTANEOUS
  Administered 2014-09-17: 5 [IU] via SUBCUTANEOUS
  Filled 2014-09-16: qty 2
  Filled 2014-09-16: qty 5

## 2014-09-16 MED ORDER — IPRATROPIUM-ALBUTEROL 0.5-2.5 (3) MG/3ML IN SOLN: 3.0000 mL | RESPIRATORY_TRACT | Status: DC | PRN

## 2014-09-16 MED ORDER — CARVEDILOL 3.125 MG PO TABS
3.1250 mg | ORAL_TABLET | Freq: Two times a day (BID) | ORAL | Status: DC
  Administered 2014-09-16 – 2014-09-18 (×5): 3.125 mg via ORAL
  Filled 2014-09-16 (×5): qty 1

## 2014-09-16 MED ORDER — FLUTICASONE PROPIONATE 50 MCG/ACT NA SUSP: 2.0000 | Freq: Every day | NASAL | Status: DC | PRN

## 2014-09-16 MED ORDER — ONDANSETRON HCL 4 MG PO TABS: 4.0000 mg | ORAL_TABLET | Freq: Four times a day (QID) | ORAL | Status: DC | PRN

## 2014-09-16 MED ORDER — CYCLOBENZAPRINE HCL 10 MG PO TABS: 5.0000 mg | ORAL_TABLET | Freq: Three times a day (TID) | ORAL | Status: DC | PRN

## 2014-09-16 MED ORDER — PRAVASTATIN SODIUM 20 MG PO TABS
40.0000 mg | ORAL_TABLET | Freq: Every day | ORAL | Status: DC
  Administered 2014-09-16 – 2014-09-17 (×2): 40 mg via ORAL
  Filled 2014-09-16 (×2): qty 2

## 2014-09-16 NOTE — ED Notes (Signed)
Pt to ED via EMS from [facility] c/o breathing difficulty.  Per EMS pt O2 sats 68% at facility and being given nebulizer and cyanotic around lips, pt then placed on 2L [place] with 92% saturation.  Pt wears 2L continuously.  Pt presents not cyanotic, tachycardic, 88% on 2L.  O2 raised to 4L pt at 92%.  Pt A&Ox4, speaking in complete and coherent sentences and in NAD at this time.

## 2014-09-16 NOTE — Progress Notes (Signed)
ANTIBIOTIC CONSULT NOTE - INITIAL  Pharmacy Consult for Levaquin  Indication: pneumonia  Allergies  Allergen Reactions  . Hydrocodone-Acetaminophen Nausea Only    Patient Measurements: Height: 5\' 3"  (160 cm) Weight: 178 lb 11.2 oz (81.058 kg) IBW/kg (Calculated) : 52.4 Adjusted Body Weight:   Vital Signs: Temp: 97.6 F (36.4 C) (09/06 0739) Temp Source: Oral (09/06 0739) BP: 134/55 mmHg (09/06 0739) Pulse Rate: 96 (09/06 0739) Intake/Output from previous day:   Intake/Output from this shift: Total I/O In: 240 [P.O.:240] Out: -   Labs:  Recent Labs  09/16/14 0240  WBC 15.2*  HGB 11.1*  PLT 267  CREATININE 1.78*   Estimated Creatinine Clearance: 25.9 mL/min (by C-G formula based on Cr of 1.78). No results for input(s): VANCOTROUGH, VANCOPEAK, VANCORANDOM, GENTTROUGH, GENTPEAK, GENTRANDOM, TOBRATROUGH, TOBRAPEAK, TOBRARND, AMIKACINPEAK, AMIKACINTROU, AMIKACIN in the last 72 hours.   Microbiology: Recent Results (from the past 720 hour(s))  MRSA PCR Screening     Status: None   Collection Time: 09/16/14  2:25 AM  Result Value Ref Range Status   MRSA by PCR NEGATIVE NEGATIVE Final    Comment:        The GeneXpert MRSA Assay (FDA approved for NASAL specimens only), is one component of a comprehensive MRSA colonization surveillance program. It is not intended to diagnose MRSA infection nor to guide or monitor treatment for MRSA infections.     Medical History: Past Medical History  Diagnosis Date  . Asthma   . A-fib   . Arthritis   . Aortic stenosis   . Aortic insufficiency   . Hyperlipidemia   . Hypertension   . Sleep apnea   . CHF (congestive heart failure)   . History of DVT (deep vein thrombosis)   . Diabetes mellitus type 2, uncontrolled   . COPD (chronic obstructive pulmonary disease)   . Cervical spondylolysis   . DDD (degenerative disc disease), cervical   . Anemia   . Mitral valve disorder   . Anxiety   . Chronic gout   . Restless  leg syndrome   . Osteoarthritis     Medications:  Prescriptions prior to admission  Medication Sig Dispense Refill Last Dose  . allopurinol (ZYLOPRIM) 100 MG tablet Take 100 mg by mouth daily.   unknown at unknown  . Cranberry 250 MG TABS Take 500 mg by mouth daily.   unknown at unknown  . insulin regular (NOVOLIN R,HUMULIN R) 100 units/mL injection Inject into the skin daily. Per sliding scale   unknown at unknown  . lactulose (CHRONULAC) 10 GM/15ML solution Take 10 g by mouth 2 (two) times daily as needed for mild constipation.   PRN at PRN  . potassium chloride SA (K-DUR,KLOR-CON) 20 MEQ tablet Take 20 mEq by mouth 2 (two) times daily.   unknown at unknown  . Probiotic Product (PROBIOTIC DAILY PO) Take 250 mg by mouth 2 (two) times daily.   unknown at unknown  . sitaGLIPtin (JANUVIA) 50 MG tablet Take 50 mg by mouth daily.   unknown at unknown  . valACYclovir (VALTREX) 1000 MG tablet Take 1,000 mg by mouth 3 (three) times daily.   unknown at unknown  . ALPRAZolam (XANAX) 0.25 MG tablet Take 1 tablet (0.25 mg total) by mouth every evening. 30 tablet 0 unknown at unknown  . azelastine (ASTELIN) 0.1 % nasal spray Place 1 spray into both nostrils 2 (two) times daily.   unknown at unknown  . azithromycin (ZITHROMAX) 250 MG tablet Take once daily for 2  more days (Patient not taking: Reported on 09/16/2014) 2 each 0 Completed Course at Unknown time  . carvedilol (COREG) 3.125 MG tablet Take 3.125 mg by mouth 2 (two) times daily.   unknown at unknown  . clotrimazole (LOTRIMIN) 1 % cream Apply 1 application topically 2 (two) times daily. Pt mixes with zinc oxide cream.   unknown at unknown  . cyclobenzaprine (FLEXERIL) 5 MG tablet Take 5 mg by mouth 3 (three) times daily as needed for muscle spasms.   PRN at PRN  . diclofenac sodium (VOLTAREN) 1 % GEL Apply 2 g topically 2 (two) times daily.   PRN at PRN  . docusate sodium (COLACE) 100 MG capsule Take 1 capsule (100 mg total) by mouth 2 (two) times  daily. 10 capsule 0 unknown at unknown  . ferrous sulfate 325 (65 FE) MG tablet Take 325 mg by mouth daily.   unknown at unknown  . fluticasone (FLONASE) 50 MCG/ACT nasal spray Place 2 sprays into both nostrils daily as needed for allergies or rhinitis.   PRN at PRN  . Fluticasone-Salmeterol (ADVAIR) 250-50 MCG/DOSE AEPB Inhale 1 puff into the lungs 2 (two) times daily.   Given By EMS at unknown  . furosemide (LASIX) 40 MG tablet Take 40 mg by mouth 2 (two) times daily.   unknown at unknown  . gabapentin (NEURONTIN) 300 MG capsule Take 600 mg by mouth 3 (three) times daily. Pt is able to take one extra capsule at bedtime.   unknown at unknown  . glimepiride (AMARYL) 1 MG tablet Take 1 mg by mouth 2 (two) times daily.   unknown at unknown  . ipratropium-albuterol (DUONEB) 0.5-2.5 (3) MG/3ML SOLN Take 3 mLs by nebulization every 4 (four) hours as needed. 360 mL 1 PRN at PRN  . meclizine (ANTIVERT) 12.5 MG tablet Take 12.5 mg by mouth 3 (three) times daily as needed for dizziness.   PRN at PRN  . methylPREDNISolone (MEDROL DOSEPAK) 4 MG TBPK tablet follow package directions (Patient not taking: Reported on 09/16/2014) 21 tablet 0 Completed Course at Unknown time  . methylPREDNISolone (MEDROL DOSEPAK) 4 MG TBPK tablet follow package directions (Patient not taking: Reported on 09/16/2014) 21 tablet 0 Completed Course at Unknown time  . metolazone (ZAROXOLYN) 5 MG tablet Take 5 mg by mouth 3 (three) times a week. Pt takes on Monday, Thursday, and Sunday.   unknown at unknown  . mirabegron ER (MYRBETRIQ) 50 MG TB24 tablet Take 1 tablet (50 mg total) by mouth daily. 30 tablet 2 unknown at unknown  . mometasone (NASONEX) 50 MCG/ACT nasal spray Place 1 spray into the nose daily.    PRN at PRN  . montelukast (SINGULAIR) 10 MG tablet Take 10 mg by mouth daily.   unknown at unknown  . nitrofurantoin (MACRODANTIN) 50 MG capsule Take 1 capsule (50 mg total) by mouth at bedtime. (Patient not taking: Reported on 09/16/2014)  90 capsule 0 Completed Course at Unknown time  . polyethylene glycol (MIRALAX / GLYCOLAX) packet Take 17 g by mouth daily. 14 each 0 PRN at PRN  . pravastatin (PRAVACHOL) 40 MG tablet Take 40 mg by mouth at bedtime.   unknown at unknown  . promethazine (PHENERGAN) 12.5 MG tablet Take 12.5 mg by mouth every 8 (eight) hours as needed for nausea or vomiting.   PRN at PRN  . rOPINIRole (REQUIP) 0.25 MG tablet Take 0.25 mg by mouth at bedtime.   unknown at unknown  . senna (SENOKOT) 8.6 MG TABS tablet Take  1 tablet (8.6 mg total) by mouth at bedtime as needed for mild constipation. 120 each 0 PRN at PRN  . traMADol (ULTRAM) 50 MG tablet Take 1 tablet (50 mg total) by mouth every 6 (six) hours as needed for moderate pain. (Patient taking differently: Take 50 mg by mouth every 12 (twelve) hours as needed for moderate pain. ) 30 tablet 0 unknown at unknown  . ZINC OXIDE, TOPICAL, 10 % CREA Apply 1 application topically 2 (two) times daily. Pt mixes with Clotrimazole cream.   unknown at unknown   Assessment: CrCl = 25.9 ml/min  Goal of Therapy:  Resolution of infection  Plan:  Expected duration 7 days with resolution of temperature and/or normalization of WBC  Levaquin 500 mg PO daily originally ordered.  Will adjust dose to Levaquin 500 mg PO X 1 to be given on 9/6 followed by Levaquin 250 mg PO daily to start 9/7 @ 18:00.   Avalynn Bowe D 09/16/2014,4:54 PM

## 2014-09-16 NOTE — ED Notes (Signed)
Lab informed this RN of elevated troponin of 0.05, Dr. Manson Passey notified.

## 2014-09-16 NOTE — H&P (Addendum)
Mindy Cook is an 81 y.o. female.   Chief Complaint: Hypoxia HPI: The patient presents emergency department via EMS from [facility] where she was found to be hypoxic into the low 70s by the facility staff. Earlier in the evening the patient's daughter had been concerned that her mother was listless and difficult to arouse prior to supper. She notes that the patient was then uncoordinated and speaking incoherently at times. Gradually however as she was aroused she became more alert and coherent. The patient only admits to lower extremity and ankle pain. She denies nausea, vomiting, chest pain or cough. She admits to feeling shortness of breath at times but this is baseline for her as she is always on 2 L of oxygen via nasal cannula. In the emergency department the patient was found to have atrial fibrillation with rapid ventricular rate upon arrival. Heart rate was found to be approximately 120 but the tachyarrhythmia broke without intervention. Both oxygen saturations recorded in the emergency department 88% on 2 L of oxygen nasal cannula. Due to the patient's increased oxygen requirement and documented arrhythmia emergency department staff called for admission.  Past Medical History  Diagnosis Date  . Asthma   . A-fib   . Arthritis   . Aortic stenosis   . Aortic insufficiency   . Hyperlipidemia   . Hypertension   . Sleep apnea   . CHF (congestive heart failure)   . History of DVT (deep vein thrombosis)   . Diabetes mellitus type 2, uncontrolled   . COPD (chronic obstructive pulmonary disease)   . Cervical spondylolysis   . DDD (degenerative disc disease), cervical   . Anemia   . Mitral valve disorder   . Anxiety   . Chronic gout   . Restless leg syndrome   . Osteoarthritis     Past Surgical History  Procedure Laterality Date  . Abdominal hysterectomy    . Cardiac catheterization    . Colonoscopy      Family History  Problem Relation Age of Onset  . Heart failure Mother    . Cancer Father   . Heart disease Father   . Breast cancer Sister   . Fibromyalgia Sister    Social History:  reports that she has never smoked. She does not have any smokeless tobacco history on file. She reports that she does not drink alcohol or use illicit drugs.  Allergies:  Allergies  Allergen Reactions  . Hydrocodone-Acetaminophen Nausea Only    Prior to Admission medications   Medication Sig Start Date End Date Taking? Authorizing Provider  allopurinol (ZYLOPRIM) 100 MG tablet Take 100 mg by mouth daily.   Yes Historical Provider, MD  Cranberry 250 MG TABS Take 500 mg by mouth daily.   Yes Historical Provider, MD  insulin regular (NOVOLIN R,HUMULIN R) 100 units/mL injection Inject into the skin daily. Per sliding scale   Yes Historical Provider, MD  lactulose (CHRONULAC) 10 GM/15ML solution Take 10 g by mouth 2 (two) times daily as needed for mild constipation.   Yes Historical Provider, MD  potassium chloride SA (K-DUR,KLOR-CON) 20 MEQ tablet Take 20 mEq by mouth 2 (two) times daily.   Yes Historical Provider, MD  Probiotic Product (PROBIOTIC DAILY PO) Take 250 mg by mouth 2 (two) times daily.   Yes Historical Provider, MD  sitaGLIPtin (JANUVIA) 50 MG tablet Take 50 mg by mouth daily.   Yes Historical Provider, MD  valACYclovir (VALTREX) 1000 MG tablet Take 1,000 mg by mouth 3 (three)  times daily. 09/12/14 09/19/14 Yes Historical Provider, MD  ALPRAZolam Duanne Moron) 0.25 MG tablet Take 1 tablet (0.25 mg total) by mouth every evening. 09/01/14   Theodoro Grist, MD  azelastine (ASTELIN) 0.1 % nasal spray Place 1 spray into both nostrils 2 (two) times daily.    Historical Provider, MD  azithromycin (ZITHROMAX) 250 MG tablet Take once daily for 2 more days Patient not taking: Reported on 09/16/2014 08/31/14   Theodoro Grist, MD  carvedilol (COREG) 3.125 MG tablet Take 3.125 mg by mouth 2 (two) times daily.    Historical Provider, MD  clotrimazole (LOTRIMIN) 1 % cream Apply 1 application  topically 2 (two) times daily. Pt mixes with zinc oxide cream.    Historical Provider, MD  cyclobenzaprine (FLEXERIL) 5 MG tablet Take 5 mg by mouth 3 (three) times daily as needed for muscle spasms.    Historical Provider, MD  diclofenac sodium (VOLTAREN) 1 % GEL Apply 2 g topically 2 (two) times daily.    Historical Provider, MD  docusate sodium (COLACE) 100 MG capsule Take 1 capsule (100 mg total) by mouth 2 (two) times daily. 08/31/14   Theodoro Grist, MD  ferrous sulfate 325 (65 FE) MG tablet Take 325 mg by mouth daily.    Historical Provider, MD  fluticasone (FLONASE) 50 MCG/ACT nasal spray Place 2 sprays into both nostrils daily as needed for allergies or rhinitis.    Historical Provider, MD  Fluticasone-Salmeterol (ADVAIR) 250-50 MCG/DOSE AEPB Inhale 1 puff into the lungs 2 (two) times daily.    Historical Provider, MD  furosemide (LASIX) 40 MG tablet Take 40 mg by mouth 2 (two) times daily.    Historical Provider, MD  gabapentin (NEURONTIN) 300 MG capsule Take 600 mg by mouth 3 (three) times daily. Pt is able to take one extra capsule at bedtime.    Historical Provider, MD  glimepiride (AMARYL) 1 MG tablet Take 1 mg by mouth 2 (two) times daily.    Historical Provider, MD  ipratropium-albuterol (DUONEB) 0.5-2.5 (3) MG/3ML SOLN Take 3 mLs by nebulization every 4 (four) hours as needed. 08/31/14   Theodoro Grist, MD  meclizine (ANTIVERT) 12.5 MG tablet Take 12.5 mg by mouth 3 (three) times daily as needed for dizziness.    Historical Provider, MD  methylPREDNISolone (MEDROL DOSEPAK) 4 MG TBPK tablet follow package directions Patient not taking: Reported on 09/16/2014 08/31/14   Theodoro Grist, MD  methylPREDNISolone (MEDROL DOSEPAK) 4 MG TBPK tablet follow package directions Patient not taking: Reported on 09/16/2014 09/01/14   Theodoro Grist, MD  metolazone (ZAROXOLYN) 5 MG tablet Take 5 mg by mouth 3 (three) times a week. Pt takes on Monday, Thursday, and Sunday.    Historical Provider, MD  mirabegron  ER (MYRBETRIQ) 50 MG TB24 tablet Take 1 tablet (50 mg total) by mouth daily. 07/22/14   Ardis Hughs, MD  mometasone (NASONEX) 50 MCG/ACT nasal spray Place 1 spray into the nose daily.     Historical Provider, MD  montelukast (SINGULAIR) 10 MG tablet Take 10 mg by mouth daily.    Historical Provider, MD  nitrofurantoin (MACRODANTIN) 50 MG capsule Take 1 capsule (50 mg total) by mouth at bedtime. Patient not taking: Reported on 09/16/2014 07/22/14   Ardis Hughs, MD  polyethylene glycol [facility] Floria Raveling) packet Take 17 g by mouth daily. 09/01/14   Theodoro Grist, MD  pravastatin (PRAVACHOL) 40 MG tablet Take 40 mg by mouth at bedtime.    Historical Provider, MD  promethazine (PHENERGAN) 12.5 MG tablet  Take 12.5 mg by mouth every 8 (eight) hours as needed for nausea or vomiting.    Historical Provider, MD  rOPINIRole (REQUIP) 0.25 MG tablet Take 0.25 mg by mouth at bedtime.    Historical Provider, MD  senna (SENOKOT) 8.6 MG TABS tablet Take 1 tablet (8.6 mg total) by mouth at bedtime as needed for mild constipation. 08/31/14   Theodoro Grist, MD  traMADol (ULTRAM) 50 MG tablet Take 1 tablet (50 mg total) by mouth every 6 (six) hours as needed for moderate pain. Patient taking differently: Take 50 mg by mouth every 12 (twelve) hours as needed for moderate pain.  08/31/14   Theodoro Grist, MD  ZINC OXIDE, TOPICAL, 10 % CREA Apply 1 application topically 2 (two) times daily. Pt mixes with Clotrimazole cream.    Historical Provider, MD     Results for orders placed or performed during the hospital encounter of 09/16/14 (from the past 48 hour(s))  Basic metabolic panel     Status: Abnormal   Collection Time: 09/16/14  2:40 AM  Result Value Ref Range   Sodium 133 (L) 135 - 145 mmol/L   Potassium 4.2 3.5 - 5.1 mmol/L   Chloride 83 (L) 101 - 111 mmol/L   CO2 36 (H) 22 - 32 mmol/L   Glucose, Bld 214 (H) 65 - 99 mg/dL   BUN 83 (H) 6 - 20 mg/dL   Creatinine, Ser 1.78 (H) 0.44 - 1.00 mg/dL    Calcium 10.1 8.9 - 10.3 mg/dL   GFR calc non Af Amer 26 (L) >60 mL/min   GFR calc Af Amer 30 (L) >60 mL/min    Comment: (NOTE) The eGFR has been calculated using the CKD EPI equation. This calculation has not been validated in all clinical situations. eGFR's persistently <60 mL/min signify possible Chronic Kidney Disease.    Anion gap 14 5 - 15  CBC     Status: Abnormal   Collection Time: 09/16/14  2:40 AM  Result Value Ref Range   WBC 15.2 (H) 3.6 - 11.0 K/uL   RBC 3.82 3.80 - 5.20 MIL/uL   Hemoglobin 11.1 (L) 12.0 - 16.0 g/dL   HCT 34.0 (L) 35.0 - 47.0 %   MCV 89.0 80.0 - 100.0 fL   MCH 29.0 26.0 - 34.0 pg   MCHC 32.6 32.0 - 36.0 g/dL   RDW 15.9 (H) 11.5 - 14.5 %   Platelets 267 150 - 440 K/uL  Troponin I     Status: Abnormal   Collection Time: 09/16/14  2:40 AM  Result Value Ref Range   Troponin I 0.05 (H) <0.031 ng/mL    Comment: READ BACK AND VERIFIED NINA RODRIQUEZ 09/16/2014 0320 [facility]        PERSISTENTLY INCREASED TROPONIN VALUES IN THE RANGE OF 0.04-0.49 ng/mL CAN BE SEEN IN:       -UNSTABLE ANGINA       -CONGESTIVE HEART FAILURE       -MYOCARDITIS       -CHEST TRAUMA       -ARRYHTHMIAS       -LATE PRESENTING MYOCARDIAL INFARCTION       -COPD   CLINICAL FOLLOW-UP RECOMMENDED.   Brain natriuretic peptide     Status: Abnormal   Collection Time: 09/16/14  2:40 AM  Result Value Ref Range   B Natriuretic Peptide 240.0 (H) 0.0 - 100.0 pg/mL   Dg Chest Port 1 View  09/16/2014   CLINICAL DATA:  Cough and difficulty breathing tonight.  EXAM:  PORTABLE CHEST - 1 VIEW  COMPARISON:  09/01/2014  FINDINGS: Shallow inspiration. Cardiac enlargement. Mild vascular congestion. Perihilar infiltrates suggesting edema. No blunting of costophrenic angles. No pneumothorax. Calcification of the aorta. Postsurgical changes in the heart.  IMPRESSION: Cardiac enlargement with mild vascular congestion and suggestion of perihilar edema.   Electronically Signed   By: Lucienne Capers M.D.   On:  09/16/2014 03:26    Review of Systems  Constitutional: Negative for fever and chills.  HENT: Negative for sore throat and tinnitus.   Eyes: Negative for blurred vision and redness.  Respiratory: Positive for shortness of breath. Negative for cough.   Cardiovascular: Negative for chest pain, palpitations, orthopnea and PND.  Gastrointestinal: Negative for nausea, vomiting, abdominal pain and diarrhea.  Genitourinary: Negative for dysuria, urgency and frequency.  Musculoskeletal: Negative for myalgias and joint pain.  Skin: Negative for rash.       No lesions  Neurological: Negative for speech change, focal weakness and weakness.  Endo/Heme/Allergies: Does not bruise/bleed easily.       No temperature intolerance  Psychiatric/Behavioral: Negative for depression and suicidal ideas.    Blood pressure 110/51, pulse 82, temperature 98.9 F (37.2 C), temperature source Oral, resp. rate 21, height 5' 3"  (1.6 m), weight 85.186 kg (187 lb 12.8 oz), SpO2 93 %. Physical Exam  Nursing note and vitals reviewed. Constitutional: She is oriented to person, place, and time. She appears well-developed and well-nourished. No distress.  HENT:  Head: Normocephalic and atraumatic.  Mouth/Throat: Oropharynx is clear and moist.  Eyes: Conjunctivae and EOM are normal. Pupils are equal, round, and reactive to light. No scleral icterus.  Neck: Normal range of motion. Neck supple. No JVD present. No tracheal deviation present. No thyromegaly present.  Cardiovascular: Normal rate, regular rhythm and normal heart sounds.  Exam reveals no gallop and no friction rub.   No murmur heard. Respiratory: Effort normal and breath sounds normal.  GI: Soft. Bowel sounds are normal. She exhibits no distension. There is no tenderness.  Genitourinary:  Deferred  Musculoskeletal: Normal range of motion. She exhibits edema.  Lymphadenopathy:    She has no cervical adenopathy.  Neurological: She is alert and oriented to  person, place, and time. No cranial nerve deficit. She exhibits normal muscle tone.  Skin: Skin is warm and dry. No erythema.  Ichthyosis; prunes in healing stages left pectoralis; sacral decubitus (not visualized by examiner)  Psychiatric: She has a normal mood and affect. Her behavior is normal. Judgment and thought content normal.     Assessment/Plan This is a 81 year old Caucasian female admitted for hypoxia secondary to COPD, atrial fibrillation with rapid ventricular rate, and acute on chronic kidney injury. 1. Hypoxia: The patient wears 2 L of oxygen via nasal cannula at baseline however she has required up to 4 L of oxygen at times to maintain saturations greater than 90%. Currently she is on 2-1/2 L of oxygen satting 88-90% which is acceptable for COPD. The patient's tachycardia likely has contributed to shortness of breath and possible transient increased pulmonary congestion resulting in worsening hypoxia. Her chest x-ray appears the same if not slightly better than previous admission 2 weeks ago (no fluid in fissure of the right middle lobe). She does not appear to have a pneumonia. (Levofloxacin held). Continue to supply supplemental oxygen as needed to maintain oxygen saturations between 88 and 94%. 2. COPD: The patient has completed azithromycin and a taper of steroids (explains leukocytosis). We will continue inhaled corticosteroid. 3. Atrial fibrillation with  rapid ventricular rate: Resolved. Patient's heart rate is between 90 and 95 at this time. Continue to monitor telemetry.  4. Congestive heart failure: Systolic; stable. Patient may have some transient increases in pulmonary vascular congestion that result in decrease oxygen saturations however she does not appear to be in overt failure. In fact her BNP is lower than on previous admission. Initially Lasix had been ordered by the emergency department but I have held this IV dose as it appears the patient may have acute kidney injury  secondary to decreased flow or dehydration from diuresis. We will continue the patient's oral dose of maintenance diuretics. I also believe the patient may benefit from improved contractility and I have started her on a low dose of digoxin. This may improve her tendency to have A. fib with tachycardia as well. Consultation to cardiology placed. 5. Acute on chronic kidney injury: Stage III worsening to stage IV. I believe this may be secondary to over-diuresis. We'll gently hydrate the patient with intravenous fluid at maintenance rate. Avoid nephrotoxic drugs. I have discontinued the patient's Voltaren gel and tramadol due to its NSAID renal effect.  6. Elevated troponin: Likely secondary to atrial fibrillation with rapid ventricular rate as well as decreased clearance secondary to worsening kidney function. The patient denies chest pain at this time. Obtain another set of cardiac enzymes. 7. Hyponatremia: The patient's heart failure has improved since last visit yet sodium has decreased indicating that this may not be due to volume overload. Perhaps it will improve with hydration with normal saline. 8. Diabetes mellitus type 2: Hold oral hypoglycemics and place patient on sliding-scale insulin while hospitalized 9. Sacral decubitus: Not visualized due to difficulty rolling the patient. I have ordered a gel pad barrier for skin care. 10. DVT prophylaxis: Heparin 11. GI prophylaxis: None The patient is a full code. Time spent on admission orders and patient care approximately 35 minutes  Harrie Foreman 09/16/2014, 6:02 AM

## 2014-09-16 NOTE — Progress Notes (Signed)
[facility] - [place] at [facility]   PATIENT NAME: Mindy Cook    MR#:  [id number]  DATE OF BIRTH:  July 14, 1933  SUBJECTIVE:  CHIEF COMPLAINT:   Chief Complaint  Patient presents with  . Shortness of Breath   patient is a 81 year old Caucasian female with history of asthma, A. fib, aortic stenosis, who presents to the hospital with complaints of shortness of breath. On arrival to the hospital patient was noted to be in A. fib, RVR, rate of 120. However, tachyarrhythmia broke without intervention. Oxygen saturation was 88% on 2 L of oxygen through nasal cannula. Patient's chest x-ray revealed mild congestive heart failure. However, patient's creatinine was up, due to concerns of acute on chronic renal failure. Patient was initiated on low rate IV fluids.  Now patient is complaining of some cough with yellow phlegm production, wheezing, shortness of breath, which is going on for the past few days. Overall does not feel bad. He denies any pains or discomfort at present    Review of Systems  Constitutional: Negative for fever, chills and weight loss.  HENT: Negative for congestion.   Eyes: Negative for blurred vision and double vision.  Respiratory: Positive for cough, sputum production, shortness of breath and wheezing.   Cardiovascular: Negative for chest pain, palpitations, orthopnea, leg swelling and PND.  Gastrointestinal: Negative for nausea, vomiting, abdominal pain, diarrhea, constipation and blood in stool.  Genitourinary: Negative for dysuria, urgency, frequency and hematuria.  Musculoskeletal: Negative for falls.  Neurological: Negative for dizziness, tremors, focal weakness and headaches.  Endo/Heme/Allergies: Does not bruise/bleed easily.  Psychiatric/Behavioral: Negative for depression. The patient does not have insomnia.     VITAL SIGNS: Blood pressure 134/55, pulse 96, temperature 97.6 F (36.4 C), temperature source Oral, resp. rate 18, height   (1.6 m), weight 81.058 kg (178 lb 11.2 oz), SpO2 96 %.  PHYSICAL EXAMINATION:   GENERAL:  81 y.o.-year-old patient lying in the bed with no acute distress.  somewhat somnolent, but able to arouse and converses,  competent .  EYES: Pupils equal, round, reactive to light and accommodation. No scleral icterus. Extraocular muscles intact.  HEENT: Head atraumatic, normocephalic. Oropharynx and nasopharynx clear.  NECK:  Supple, no jugular venous distention. No thyroid enlargement, no tenderness.  LUNGS: Normal breath soundon the left. However, patient does have wheezing in the right lower lobe, also diminished breath sounds. Not using accessory muscles of respiration, although somewhat tachypneic whenever he tries to move from side to side.  CARDIOVASCULAR: S1, S2 , irregularly irregular, 3/6 systolic murmur was heard precordially, no radiation to axilla, however, to neck. No  rubs, or gallops.  ABDOMEN: Soft, nontender, nondistended. Bowel sounds present. No organomegaly or mass.  EXTREMITIES: No pedal edema, cyanosis, or clubbing.  NEUROLOGIC: Cranial nerves II through XII are intact. Muscle strength 5/5 in all extremities. Sensation intact. Gait not checked.  PSYCHIATRIC: The patient isomewhat somnolent but arousable converses,  oriented x 3.  SKIN: No obvious rash, lesion, or ulcer.   ORDERS/RESULTS REVIEWED:   CBC  Recent Labs Lab 09/16/14 0240  WBC 15.2*  HGB 11.1*  HCT 34.0*  PLT 267  MCV 89.0  MCH 29.0  MCHC 32.6  RDW 15.9*   ------------------------------------------------------------------------------------------------------------------  Chemistries   Recent Labs Lab 09/16/14 0240  NA 133*  K 4.2  CL 83*  CO2 36*  GLUCOSE 214*  BUN 83*  CREATININE 1.78*  CALCIUM 10.1   ------------------------------------------------------------------------------------------------------------------ estimated creatinine clearance is 25.9 mL/min (by C-G formula  based on Cr of  1.78). ------------------------------------------------------------------------------------------------------------------  Recent Labs  09/16/14 0240  TSH 1.707    Cardiac Enzymes  Recent Labs Lab 09/16/14 0240  TROPONINI 0.05*   ------------------------------------------------------------------------------------------------------------------ Invalid input(s): POCBNP ---------------------------------------------------------------------------------------------------------------  RADIOLOGY: Dg Chest Port 1 View  09/16/2014   CLINICAL DATA:  Cough and difficulty breathing tonight.  EXAM: PORTABLE CHEST - 1 VIEW  COMPARISON:  09/01/2014  FINDINGS: Shallow inspiration. Cardiac enlargement. Mild vascular congestion. Perihilar infiltrates suggesting edema. No blunting of costophrenic angles. No pneumothorax. Calcification of the aorta. Postsurgical changes in the heart.  IMPRESSION: Cardiac enlargement with mild vascular congestion and suggestion of perihilar edema.   Electronically Signed   By: Burman Nieves M.D.   On: 09/16/2014 03:26    EKG:  Orders placed or performed during the hospital encounter of 09/16/14  . EKG 12-Lead  . EKG 12-Lead  . ED EKG  . ED EKG    ASSESSMENT AND PLAN:  Active Problems:   Hypoxemia   Pressure ulcer 1. Acute on chronic respiratory failure likely due to COPD, asthma exacerbation, initiate oral steroids. Continue nebulizing therapy with the latter as well as duo nebs, continue oxygen therapy as needed to keep pulse oximeter is around 92-94% 2. Asthma exacerbation as above. Will initiate patient on steroids due to her wheezing. Follow clinically 3. Acute bronchitis. Patient reports yellow phlegm. We will be culturing sputum and adjusting the antibiotic therapy depending on sputum culture results, initiate patient on levofloxacin 4. Acute on chronic renal failure likely ATN, follow with IV fluid administration, holding diuretics, following kidney  function in the morning 5. History of superficial thrombophlebitis, continue patient on aspirin therapy 6. History of chronic diastolic CHF, watch for fluid overload.    Management plans discussed with the patient, family and they are in agreement.   DRUG ALLERGIES:  Allergies  Allergen Reactions  . Hydrocodone-Acetaminophen Nausea Only    CODE STATUS:     Code Status Orders        Start     Ordered   09/16/14 0747  Full code   Continuous     09/16/14 0746      TOTAL TIME TAKING CARE OF THIS PATIENT: 40 minutes.    Katharina Caper M.D on 09/16/2014 at 4:17 PM  Between 7am to 6pm - Pager - [phone]  After 6pm go to [email or web address] - password [email or web address] [facility]  [place] [facility]  Office  [phone]  CC: Primary care physician; Patrice Paradise, MD

## 2014-09-16 NOTE — Clinical Social Work Note (Signed)
Clinical Social Work Assessment  Patient Details  Name: Mindy Cook MRN: [id number] Date of Birth: 1933-02-04  Date of referral:  09/16/14               Reason for consult:  Facility Placement (pt was from [facility].)                Permission sought to share information with:  Facility Medical sales representative, Family Supports Permission granted to share information::  Yes, Verbal Permission Granted  Name::     [place],Bill   [phone]  Agency::  [facility]  Relationship::     Contact Information:     Housing/Transportation Living arrangements for the past 2 months:  Single Family Home, Skilled Nursing Facility Source of Information:  Patient, Spouse Patient Interpreter Needed:  None Criminal Activity/Legal Involvement Pertinent to Current Situation/Hospitalization:  No - Comment as needed Significant Relationships:  Adult Children, Spouse Lives with:  Spouse Do you feel safe going back to the place where you live?  No Need for family participation in patient care:  Yes (Comment)  Care giving concerns:  Pt's husband stated that he might be interested in [facility] if pt did not want to go back to [facility].   Social Worker assessment / plan:  CSW spoke to pt, however she fell asleep while CSW assessing her.  CSW spoke to pt's husband he confirmed that he and the pt were the only ones who live in the home.  He stated that pt used a walker and there were a variety of walker available at the home.  CSW also spoke to facility.  They stated that pt could come back to [facility] on her DC.    Employment status:  Retired, Disabled (Comment on whether or not currently receiving Disability) Insurance information:  Medicare PT Recommendations:  Skilled Nursing Facility Information / Referral to community resources:  Skilled Nursing Facility  Patient/Family's Response to care:  Pt was in agreement with DC back to SNF  Patient/Family's  Understanding of and Emotional Response to Diagnosis, Current Treatment, and Prognosis:  Pt and pt's family thanked CSW , verified their understanding of DC to SNF and all parties are in agreement with SNF DC.    Emotional Assessment Appearance:    Attitude/Demeanor/Rapport:   (sleepy ) Affect (typically observed):  Appropriate Orientation:  Oriented to Self, Oriented to Place, Oriented to  Time, Oriented to Situation Alcohol / Substance use:  Never Used Psych involvement (Current and /or in the community):  No (Comment)  Discharge Needs  Concerns to be addressed:  Care Coordination Readmission within the last 30 days:  Yes Current discharge risk:  Physical Impairment Barriers to Discharge:  Continued Medical Work up   [employer], LCSW 09/16/2014, 2:55 PM

## 2014-09-16 NOTE — ED Provider Notes (Signed)
[facility] Emergency Department Provider Note  ____________________________________________  Time seen: 2:30 AM  I have reviewed the triage vital signs and the nursing notes.   HISTORY  Chief Complaint Shortness of Breath     HPI Mindy Cook is a 81 y.o. female presents with hypoxia per EMS on their arrival oxygen level was low 80s on patient's baseline 2 L nasal cannula. Patient arrives to the emergency department without oxygen sat of 88 on 4 L. Patient denies any increased work of breathing denies any chest pain or worsening shortness of breath. Patient states she is unclear as to why they woke her from bed.     Past Medical History  Diagnosis Date  . Asthma   . A-fib   . Arthritis   . Aortic stenosis   . Aortic insufficiency   . Hyperlipidemia   . Hypertension   . Sleep apnea   . CHF (congestive heart failure)   . History of DVT (deep vein thrombosis)   . Diabetes mellitus type 2, uncontrolled   . COPD (chronic obstructive pulmonary disease)   . Cervical spondylolysis   . DDD (degenerative disc disease), cervical   . Anemia   . Mitral valve disorder     Patient Active Problem List   Diagnosis Date Noted  . Chronic prescription benzodiazepine use   . Acute on chronic respiratory failure with hypoxia 08/31/2014  . Acute bronchitis 08/31/2014  . UTI (urinary tract infection) 08/31/2014  . Gout attack 08/31/2014  . Generalized weakness 08/31/2014  . Pain in joint, lower leg 08/31/2014  . Acute on chronic respiratory failure 08/28/2014  . COPD exacerbation 08/28/2014  . Fall 08/28/2014  . A-fib 07/21/2014  . CAFL (chronic airflow limitation) 07/21/2014  . H/O deep venous thrombosis 07/21/2014  . HLD (hyperlipidemia) 07/21/2014  . Apnea, sleep 07/21/2014  . Complete rotator cuff rupture of left shoulder 07/06/2014  . Cervico-occipital neuralgia 07/15/2013  . Cervical osteoarthritis 06/28/2013  . DDD (degenerative disc disease),  cervical 06/28/2013  . Muscle spasms of neck 06/28/2013  . Renal colic 06/12/2013  . Neuropathy 05/23/2013  . History of open heart surgery 05/07/2013  . Arthritis 04/23/2013  . Acquired cyst of kidney 11/20/2012  . Hypercapnemia 06/12/2012  . H/O aortic valve replacement 06/12/2012  . Congestive heart failure 04/26/2012  . Heart failure 04/26/2012  . Absolute anemia 02/08/2012  . Aortic valve defect 02/08/2012  . Benign essential HTN 02/08/2012  . Bladder infection, chronic 02/08/2012  . Calculus of gallbladder 02/08/2012  . Calculus of kidney 02/08/2012  . Biliary calculi 02/08/2012  . Dermatophytosis of scalp and beard 02/08/2012  . Incomplete bladder emptying 02/08/2012  . Disorder of mitral valve 02/08/2012  . Mixed incontinence 02/08/2012  . Diabetes mellitus, type 2 02/08/2012    Past Surgical History  Procedure Laterality Date  . Abdominal hysterectomy    . Cardiac catheterization    . Colonoscopy      Current Outpatient Rx  Name  Route  Sig  Dispense  Refill  . ALPRAZolam (XANAX) 0.25 MG tablet   Oral   Take 1 tablet (0.25 mg total) by mouth every evening.   30 tablet   0   . azelastine (ASTELIN) 0.1 % nasal spray   Each Nare   Place 1 spray into both nostrils 2 (two) times daily.         Marland Kitchen azithromycin (ZITHROMAX) 250 MG tablet      Take once daily for 2 more days  2 each   0   . carvedilol (COREG) 3.125 MG tablet   Oral   Take 3.125 mg by mouth 2 (two) times daily.         . clotrimazole (LOTRIMIN) 1 % cream   Topical   Apply 1 application topically 2 (two) times daily. Pt mixes with zinc oxide cream.         . Cranberry 500 MG CAPS   Oral   Take 500 mg by mouth daily.         . cyclobenzaprine (FLEXERIL) 5 MG tablet   Oral   Take 5 mg by mouth 3 (three) times daily as needed for muscle spasms.         . diclofenac sodium (VOLTAREN) 1 % GEL   Topical   Apply 2 g topically 2 (two) times daily.         Marland Kitchen docusate sodium  (COLACE) 100 MG capsule   Oral   Take 1 capsule (100 mg total) by mouth 2 (two) times daily.   10 capsule   0   . ferrous sulfate 325 (65 FE) MG tablet   Oral   Take 325 mg by mouth daily.         . fluticasone (FLONASE) 50 MCG/ACT nasal spray   Each Nare   Place 2 sprays into both nostrils daily as needed for allergies or rhinitis.         . Fluticasone-Salmeterol (ADVAIR) 250-50 MCG/DOSE AEPB   Inhalation   Inhale 1 puff into the lungs 2 (two) times daily.         . furosemide (LASIX) 40 MG tablet   Oral   Take 40 mg by mouth 2 (two) times daily.         Marland Kitchen gabapentin (NEURONTIN) 300 MG capsule   Oral   Take 600 mg by mouth 3 (three) times daily. Pt is able to take one extra capsule at bedtime.         Marland Kitchen glimepiride (AMARYL) 1 MG tablet   Oral   Take 1 mg by mouth 2 (two) times daily.         . insulin aspart (NOVOLOG) 100 UNIT/ML injection   Subcutaneous   Inject 0-15 Units into the skin 4 (four) times daily -  before meals and at bedtime.   10 mL   11   . ipratropium-albuterol (DUONEB) 0.5-2.5 (3) MG/3ML SOLN   Nebulization   Take 3 mLs by nebulization every 4 (four) hours as needed.   360 mL   1   . meclizine (ANTIVERT) 12.5 MG tablet   Oral   Take 12.5 mg by mouth 3 (three) times daily as needed for dizziness.         . methylPREDNISolone (MEDROL DOSEPAK) 4 MG TBPK tablet      follow package directions   21 tablet   0   . methylPREDNISolone (MEDROL DOSEPAK) 4 MG TBPK tablet      follow package directions   21 tablet   0   . metolazone (ZAROXOLYN) 5 MG tablet   Oral   Take 5 mg by mouth 3 (three) times a week. Pt takes on Monday, Thursday, and Sunday.         . mirabegron ER (MYRBETRIQ) 50 MG TB24 tablet   Oral   Take 1 tablet (50 mg total) by mouth daily.   30 tablet   2   . mometasone (NASONEX) 50 MCG/ACT nasal spray   Nasal  Place 2 sprays into the nose daily.         . montelukast (SINGULAIR) 10 MG tablet   Oral    Take 10 mg by mouth daily.         . nitrofurantoin (MACRODANTIN) 50 MG capsule   Oral   Take 1 capsule (50 mg total) by mouth at bedtime.   90 capsule   0   . polyethylene glycol (MIRALAX / GLYCOLAX) packet   Oral   Take 17 g by mouth daily.   14 each   0   . potassium chloride SA (K-DUR,KLOR-CON) 20 MEQ tablet   Oral   Take 20 mEq by mouth 2 (two) times daily.         . pravastatin (PRAVACHOL) 40 MG tablet   Oral   Take 40 mg by mouth at bedtime.         . promethazine (PHENERGAN) 12.5 MG tablet   Oral   Take 12.5 mg by mouth every 8 (eight) hours as needed for nausea or vomiting.         Marland Kitchen rOPINIRole (REQUIP) 0.25 MG tablet   Oral   Take 0.25 mg by mouth at bedtime.         . senna (SENOKOT) 8.6 MG TABS tablet   Oral   Take 1 tablet (8.6 mg total) by mouth at bedtime as needed for mild constipation.   120 each   0   . traMADol (ULTRAM) 50 MG tablet   Oral   Take 1 tablet (50 mg total) by mouth every 6 (six) hours as needed for moderate pain.   30 tablet   0   . warfarin (COUMADIN) 5 MG tablet   Oral   Take 5 mg by mouth daily.         Marland Kitchen ZINC OXIDE, TOPICAL, 10 % CREA   Apply externally   Apply 1 application topically 2 (two) times daily. Pt mixes with Clotrimazole cream.           Allergies Hydrocodone-acetaminophen  Family History  Problem Relation Age of Onset  . Heart failure Mother   . Cancer Father   . Heart disease Father   . Breast cancer Sister   . Fibromyalgia Sister     Social History Social History  Substance Use Topics  . Smoking status: Never Smoker   . Smokeless tobacco: Not on file  . Alcohol Use: No    Review of Systems  Constitutional: Negative for fever. Eyes: Negative for visual changes. ENT: Negative for sore throat. Cardiovascular: Negative for chest pain. Respiratory: Negative for shortness of breath. Gastrointestinal: Negative for abdominal pain, vomiting and diarrhea. Genitourinary: Negative for  dysuria. Musculoskeletal: Negative for back pain. Skin: Negative for rash. Neurological: Negative for headaches, focal weakness or numbness.   10-point ROS otherwise negative.  ____________________________________________   PHYSICAL EXAM:  VITAL SIGNS: ED Triage Vitals  Enc Vitals Group     BP 09/16/14 0236 113/62 mmHg     Pulse Rate 09/16/14 0236 116     Resp 09/16/14 0236 24     Temp 09/16/14 0236 98.9 F (37.2 C)     Temp Source 09/16/14 0236 Oral     SpO2 09/16/14 0236 72 %     Weight 09/16/14 0236 187 lb 12.8 oz (85.186 kg)     Height 09/16/14 0236 5\' 3"  (1.6 m)     Head Cir --      Peak Flow --  Pain Score 09/16/14 0238 8     Pain Loc --      Pain Edu? --      Excl. in GC? --      Constitutional: Alert and oriented. Well appearing and in no distress. Eyes: Conjunctivae are normal. PERRL. Normal extraocular movements. ENT   Head: Normocephalic and atraumatic.   Nose: No congestion/rhinnorhea.   Mouth/Throat: Mucous membranes are moist.   Neck: No stridor. Hematological/Lymphatic/Immunilogical: No cervical lymphadenopathy. Cardiovascular: Irregular regular rhythm, grade 2 systolic ejection murmur  Respiratory: Normal respiratory effort without tachypnea nor retractions. Breath sounds are clear and equal bilaterally. No wheezes/rales/rhonchi. Gastrointestinal: Soft and nontender. No distention. There is no CVA tenderness. Genitourinary: deferred Musculoskeletal: Nontender with normal range of motion in all extremities. No joint effusions.  No lower extremity tenderness nor edema. Neurologic:  Normal speech and language. No gross focal neurologic deficits are appreciated. Speech is normal.  Skin:  Skin is warm, dry and intact. No rash noted. Psychiatric: Mood and affect are normal. Speech and behavior are normal. Patient exhibits appropriate insight and judgment.  ____________________________________________    LABS (pertinent  positives/negatives)  Labs Reviewed  BASIC METABOLIC PANEL - Abnormal; Notable for the following:    Sodium 133 (*)    Chloride 83 (*)    CO2 36 (*)    Glucose, Bld 214 (*)    BUN 83 (*)    Creatinine, Ser 1.78 (*)    GFR calc non Af Amer 26 (*)    GFR calc Af Amer 30 (*)    All other components within normal limits  CBC - Abnormal; Notable for the following:    WBC 15.2 (*)    Hemoglobin 11.1 (*)    HCT 34.0 (*)    RDW 15.9 (*)    All other components within normal limits  TROPONIN I - Abnormal; Notable for the following:    Troponin I 0.05 (*)    All other components within normal limits  BRAIN NATRIURETIC PEPTIDE - Abnormal; Notable for the following:    B Natriuretic Peptide 240.0 (*)    All other components within normal limits     ____________________________________________   EKG  ED ECG REPORT I, Nihaal Friesen, [place], the attending physician, personally viewed and interpreted this ECG.   Date: 09/16/2014  EKG Time: 2:37 AM  Rate: 118  Rhythm: Atrial fibrillation with rapid ventricular response  Axis: none  Intervals: Normal  ST&T Change: None   ____________________________________________    RADIOLOGY DG Chest Port 1 View (Final result) Result time: 09/16/14 03:26:49   Final result by Rad Results In Interface (09/16/14 03:26:49)   Narrative:   CLINICAL DATA: Cough and difficulty breathing tonight.  EXAM: PORTABLE CHEST - 1 VIEW  COMPARISON: 09/01/2014  FINDINGS: Shallow inspiration. Cardiac enlargement. Mild vascular congestion. Perihilar infiltrates suggesting edema. No blunting of costophrenic angles. No pneumothorax. Calcification of the aorta. Postsurgical changes in the heart.  IMPRESSION: Cardiac enlargement with mild vascular congestion and suggestion of perihilar edema.   Electronically Signed By: Burman Nieves M.D. On: 09/16/2014 03:26         INITIAL IMPRESSION / ASSESSMENT AND PLAN / ED COURSE  Pertinent  labs & imaging results that were available during my care of the patient were reviewed by me and considered in my medical decision making (see chart for details). History of physical exam consistent with pulmonary edema renal insufficiency. Patient also had an elevated troponin at 0.05. Patient received Lasix 40 mg IV atrophic relation with  rapid ventricular response. Patient's elevated troponin most likely secondary to demand ischemia secondary to atrial fibrillation with RVR.  ____________________________________________   FINAL CLINICAL IMPRESSION(S) / ED DIAGNOSES  Final diagnoses:  Acute pulmonary edema  Elevated troponin  Atrial fibrillation with rapid ventricular response      Darci Current, MD 09/16/14 [phone]

## 2014-09-17 LAB — CBC
HEMATOCRIT: 28.7 % — AB (ref 35.0–47.0)
Hemoglobin: 9.7 g/dL — ABNORMAL LOW (ref 12.0–16.0)
MCH: 30.1 pg (ref 26.0–34.0)
MCHC: 33.7 g/dL (ref 32.0–36.0)
MCV: 89.1 fL (ref 80.0–100.0)
PLATELETS: 201 10*3/uL (ref 150–440)
RBC: 3.22 MIL/uL — AB (ref 3.80–5.20)
RDW: 15.8 % — AB (ref 11.5–14.5)
WBC: 9.3 10*3/uL (ref 3.6–11.0)

## 2014-09-17 LAB — GLUCOSE, CAPILLARY
Glucose-Capillary: 136 mg/dL — ABNORMAL HIGH (ref 65–99)
Glucose-Capillary: 262 mg/dL — ABNORMAL HIGH (ref 65–99)
Glucose-Capillary: 492 mg/dL — ABNORMAL HIGH (ref 65–99)
Glucose-Capillary: 552 mg/dL (ref 65–99)
Glucose-Capillary: 442 mg/dL — ABNORMAL HIGH (ref 65–99)
Glucose-Capillary: 381 mg/dL — ABNORMAL HIGH (ref 65–99)

## 2014-09-17 LAB — BASIC METABOLIC PANEL
Anion gap: 8 (ref 5–15)
BUN: 60 mg/dL — AB (ref 6–20)
CO2: 39 mmol/L — ABNORMAL HIGH (ref 22–32)
Calcium: 9.4 mg/dL (ref 8.9–10.3)
Chloride: 89 mmol/L — ABNORMAL LOW (ref 101–111)
Creatinine, Ser: 1.11 mg/dL — ABNORMAL HIGH (ref 0.44–1.00)
GFR, EST AFRICAN AMERICAN: 53 mL/min — AB (ref >60)
GFR, EST NON AFRICAN AMERICAN: 46 mL/min — AB (ref >60)
Glucose, Bld: 130 mg/dL — ABNORMAL HIGH (ref 65–99)
POTASSIUM: 3.1 mmol/L — AB (ref 3.5–5.1)
SODIUM: 136 mmol/L (ref 135–145)

## 2014-09-17 LAB — TROPONIN I
TROPONIN I: 0.04 ng/mL — AB (ref <0.031)
TROPONIN I: 0.04 ng/mL — AB (ref <0.031)

## 2014-09-17 MED ORDER — LEVOFLOXACIN 750 MG PO TABS
750.0000 mg | ORAL_TABLET | ORAL | Status: DC
  Administered 2014-09-17: 750 mg via ORAL
  Filled 2014-09-17: qty 1

## 2014-09-17 MED ORDER — GUAIFENESIN 100 MG/5ML PO SOLN: 5.0000 mL | ORAL | Status: DC | PRN

## 2014-09-17 MED ORDER — INSULIN ASPART 100 UNIT/ML ~~LOC~~ SOLN
14.0000 [IU] | Freq: Once | SUBCUTANEOUS | Status: AC
  Administered 2014-09-17: 14 [IU] via SUBCUTANEOUS
  Filled 2014-09-17: qty 14

## 2014-09-17 MED ORDER — INFLUENZA VAC SPLIT QUAD 0.5 ML IM SUSY
0.5000 mL | PREFILLED_SYRINGE | INTRAMUSCULAR | Status: AC
  Administered 2014-09-18: 0.5 mL via INTRAMUSCULAR
  Filled 2014-09-17: qty 0.5

## 2014-09-17 MED ORDER — INSULIN ASPART 100 UNIT/ML ~~LOC~~ SOLN
18.0000 [IU] | Freq: Once | SUBCUTANEOUS | Status: AC
  Administered 2014-09-17: 18 [IU] via SUBCUTANEOUS
  Filled 2014-09-17: qty 18

## 2014-09-17 MED ORDER — GLUCERNA SHAKE PO LIQD
237.0000 mL | Freq: Two times a day (BID) | ORAL | Status: DC
  Administered 2014-09-18: 237 mL via ORAL

## 2014-09-17 MED ORDER — POTASSIUM CHLORIDE CRYS ER 20 MEQ PO TBCR
40.0000 meq | EXTENDED_RELEASE_TABLET | Freq: Once | ORAL | Status: AC
  Administered 2014-09-17: 40 meq via ORAL
  Filled 2014-09-17: qty 2

## 2014-09-17 NOTE — Clinical Social Work Note (Addendum)
CSW spoke to pt.  She espressed that she may be interested in a different rehab facility.  [facility].  CSW will f/u once answer received from facility.

## 2014-09-17 NOTE — Progress Notes (Signed)
ANTIBIOTIC CONSULT NOTE - FOLLOW UP  Pharmacy Consult for Levofloxacin Indication: Pneumonia/COPD exacerbation   Allergies  Allergen Reactions  . Hydrocodone-Acetaminophen Nausea Only    Patient Measurements: Height:  (160 cm) Weight: 176 lb 9.6 oz (80.105 kg) IBW/kg (Calculated) : 52.4   Vital Signs: Temp: 98.3 F (36.8 C) (09/07 0618) BP: 121/58 mmHg (09/07 0618) Pulse Rate: 83 (09/07 0618) Intake/Output from previous day: 09/06 0701 - 09/07 0700 In: 1155 [P.O.:240; I.V.:915] Out: -  Intake/Output from this shift: Total I/O In: 392.7 [P.O.:240; I.V.:152.7] Out: -   Labs:  Recent Labs  09/16/14 0240 09/17/14 0733  WBC 15.2* 9.3  HGB 11.1* 9.7*  PLT 267 201  CREATININE 1.78* 1.11*   Estimated Creatinine Clearance: 41.2 mL/min (by C-G formula based on Cr of 1.11). No results for input(s): VANCOTROUGH, VANCOPEAK, VANCORANDOM, GENTTROUGH, GENTPEAK, GENTRANDOM, TOBRATROUGH, TOBRAPEAK, TOBRARND, AMIKACINPEAK, AMIKACINTROU, AMIKACIN in the last 72 hours.   Microbiology: Recent Results (from the past 720 hour(s))  MRSA PCR Screening     Status: None   Collection Time: 09/16/14  2:25 AM  Result Value Ref Range Status   MRSA by PCR NEGATIVE NEGATIVE Final    Comment:        The GeneXpert MRSA Assay (FDA approved for NASAL specimens only), is one component of a comprehensive MRSA colonization surveillance program. It is not intended to diagnose MRSA infection nor to guide or monitor treatment for MRSA infections.     Anti-infectives    Start     Dose/Rate Route Frequency Ordered Stop   09/17/14 1800  levofloxacin (LEVAQUIN) tablet 250 mg     250 mg Oral Daily 09/16/14 1648     09/16/14 1700  levofloxacin (LEVAQUIN) tablet 500 mg     500 mg Oral  Once 09/16/14 1648 09/16/14 1739   09/16/14 1645  levofloxacin (LEVAQUIN) tablet 500 mg  Status:  Discontinued     500 mg Oral Daily 09/16/14 1639 09/16/14 1647   09/16/14 0415  levofloxacin (LEVAQUIN) IVPB  500 mg  Status:  Discontinued     500 mg 100 mL/hr over 60 Minutes Intravenous  Once 09/16/14 0400 09/16/14 0524      Assessment: Patient being treated for hypoxemia and COPD/Asthma exacerbation. Patient producing phlegm. Patient was originally administered levofloxacin  on 09/16/14. Renal function has improved with a drop in SCr from 1.78 to 1.11. CrCl is 41.54ml/min.   Plan:  Will start levofloxacin  every 48 hours for pneumonia coverage. Will recommended discontinuation at day 5 of treatment (09/20/14). Pharmacy will continue to monitor renal function and follow up on therapy.   Cher Nakai, PharmD Pharmacy Resident

## 2014-09-17 NOTE — Progress Notes (Signed)
Initial Nutrition Assessment  DOCUMENTATION CODES:   Non-severe (moderate) malnutrition in context of chronic illness  INTERVENTION:   Meals and Snacks: Cater to patient preferences; pt reports difficulty chewing, needs small bites. Recommend all meats be chopped on meal tray, pt agreeable. Recommend smaller, more frequent meals; small snacks ordered Medical Food Supplement Therapy: recommend Glucerna Shake po BID, each supplement provides 220 kcal and 10 grams of protein  NUTRITION DIAGNOSIS:   Malnutrition related to chronic illness, poor appetite as evidenced by percent weight loss, mild depletion of muscle mass, mild fluid accumulation.  GOAL:   Patient will meet greater than or equal to 90% of their needs  MONITOR:    (Energy Intake, Digestive System, Electrolyte/Renal Profile, Anthropometrics)  REASON FOR ASSESSMENT:    (Pressure Ulcer)    ASSESSMENT:    Pt admitted with acute on chronic respiratory failure due to COPD, asthma exacerbation, acute on CRF likely ATN  Past Medical History  Diagnosis Date  . Asthma   . A-fib   . Arthritis   . Aortic stenosis   . Aortic insufficiency   . Hyperlipidemia   . Hypertension   . Sleep apnea   . CHF (congestive heart failure)   . History of DVT (deep vein thrombosis)   . Diabetes mellitus type 2, uncontrolled   . COPD (chronic obstructive pulmonary disease)   . Cervical spondylolysis   . DDD (degenerative disc disease), cervical   . Anemia   . Mitral valve disorder   . Anxiety   . Chronic gout   . Restless leg syndrome   . Osteoarthritis     Diet Order:  Diet heart healthy/carb modified Room service appropriate?: Yes; Fluid consistency:: Thin   Energy Intake: recorded po intake 100% at breakfast; recorded po intake 77% on average  Food and Nutrition related history: pt reports appetite has been down over the last several months, reports she has been in and out of the hospital and at rehab, reports this has  affected po intake  Skin:   (stage II coccyx)  Last BM:  9/7   Nutrition Focused Physical Exam: Nutrition-Focused physical exam completed. Findings are wdl fat depletion, mild muscle depletion, and mild edema.  Electrolyte and Renal Profile:  Recent Labs Lab 09/16/14 0240 09/17/14 0733  BUN 83* 60*  CREATININE 1.78* 1.11*  NA 133* 136  K 4.2 3.1*   Glucose Profile:  Recent Labs  09/16/14 2058 09/17/14 0726 09/17/14 1142  GLUCAP 245* 136* 262*   Protein Profile: No results for input(s): ALBUMIN in the last 168 hours. Nutritional Anemia Profile:  CBC Latest Ref Rng 09/17/2014 09/16/2014 08/31/2014  WBC 3.6 - 11.0 K/uL 9.3 15.2(H) 8.6  Hemoglobin 12.0 - 16.0 g/dL 6.0(A) 11.1(L) 10.0(L)  Hematocrit 35.0 - 47.0 % 28.7(L) 34.0(L) 30.9(L)  Platelets 150 - 440 K/uL 201 267 140(L)    Meds: ss novolog  Electrolyte and Renal Profile:  Recent Labs Lab 09/16/14 0240 09/17/14 0733  BUN 83* 60*  CREATININE 1.78* 1.11*  NA 133* 136  K 4.2 3.1*   Glucose Profile:   Recent Labs  09/16/14 2058 09/17/14 0726 09/17/14 1142  GLUCAP 245* 136* 262*   Protein Profile: No results for input(s): ALBUMIN in the last 168 hours.   Meds: ss novolog   Height:   Ht Readings from Last 1 Encounters:  09/16/14 5\' 3"  (1.6 m)    Weight: 10.1% wt loss in 4 months or less; pt unsure if she has lost weight Wt Readings from  Last 1 Encounters:  09/17/14 176 lb 9.6 oz (80.105 kg)   Filed Weights   09/16/14 0236 09/16/14 0739 09/17/14 0500  Weight: 187 lb 12.8 oz (85.186 kg) 178 lb 11.2 oz (81.058 kg) 176 lb 9.6 oz (80.105 kg)    Wt Readings from Last 10 Encounters:  09/17/14 176 lb 9.6 oz (80.105 kg)  09/01/14 181 lb 1.6 oz (82.146 kg)  08/16/14 189 lb (85.73 kg)  07/25/14 185 lb (83.915 kg)  07/22/14 189 lb (85.73 kg)  06/03/14 197 lb (89.359 kg)    BMI:  Body mass index is 31.29 kg/(m^2).  Estimated Nutritional Needs:   Kcal:  1610-9604 kcals (BEE 967, 1.3 AF, 1.1-1.3 IF)  using IBW 52.3 kg  Protein:  57-68 g (1.1-1.3 g/kg)  Fluid:  1300-1560 mL (25-30 ml/kg)   MODERATE Care Level  Romelle Starcher MS, RD, LDN [phone] Pager

## 2014-09-17 NOTE — Progress Notes (Signed)
   09/17/14 1705  Clinical Encounter Type  Visited With Patient and family together  Visit Type Initial;Spiritual support  Referral From Nurse  Consult/Referral To Chaplain  Spiritual Encounters  Spiritual Needs Prayer;Literature  Stress Factors  Patient Stress Factors Health changes  Family Stress Factors Lack of caregivers  Advance Directives (For Healthcare)  Does patient have an advance directive? No  Would patient like information on creating an advanced directive? Yes - Transport planner given  Provided pastoral and presence, also prayer.  Also provided HPCOA/Living Will education and forms.  Will follow up in the morning.  [employer] Jaret Coppedge-pager [phone]

## 2014-09-17 NOTE — Progress Notes (Signed)
Inpatient Diabetes Program Recommendations  AACE/ADA: New Consensus Statement on Inpatient Glycemic Control (2013)  Target Ranges:  Prepandial:   less than 140 mg/dL      Peak postprandial:   less than 180 mg/dL (1-2 hours)      Critically ill patients:  140 - 180 mg/dL   Results for DEJAE, BERNET (MRN [id number]) as of 09/17/2014 11:41  Ref. Range 09/16/2014 08:11 09/16/2014 12:05 09/16/2014 16:42 09/16/2014 20:58 09/17/2014 07:26  Glucose-Capillary Latest Ref Range: 65-99 mg/dL 782 (H) 956 (H) 213 (H) 245 (H) 136 (H)   Current orders for Inpatient glycemic control: Novolog 0-15 units TID with meals, Novolog 0-5 units HS, Tradjenta 5 mg daily  Inpatient Diabetes Program Recommendations Insulin - Meal Coverage: Noted Predinsone 50 mg QAM was ordered today which is likely to contribute further to hyperglycemia. While inpatient and ordered steroids, please consider ordering Novolog 3 units TID with meals for meal coverage (in addition to Novolog correction scale).  Thanks, Orlando Penner, RN, MSN, CCRN, CDE Diabetes Coordinator Inpatient Diabetes Program [phone] (Team Pager from 8am to 5pm) [phone] ([facility] office) [phone] [facility] office) [phone] [facility] office)

## 2014-09-17 NOTE — Progress Notes (Signed)
[facility] - [place] [facility]   PATIENT NAME: Mindy Cook    MR#:  [id number]  DATE OF BIRTH:  11/19/1933  SUBJECTIVE:  CHIEF COMPLAINT:   Chief Complaint  Patient presents with  . Shortness of Breath   patient is a 81 year old Caucasian female with history of asthma, A. fib, aortic stenosis, who presents to the hospital with complaints of shortness of breath. On arrival to the hospital patient was noted to be in A. fib, RVR, rate of 120. However, tachyarrhythmia broke without intervention. Oxygen saturation was 88% on 2 L of oxygen through nasal cannula. Patient's chest x-ray revealed mild congestive heart failure. However, patient's creatinine was up, due to concerns of acute on chronic renal failure. Patient was initiated on low rate IV fluids.  Now patient is complaining of some cough with yellow phlegm production, wheezing, shortness of breath, which is going on for the past few days. [place] Spaniel, feels same as yesterday. She is not able to walk to bathroom on her own.   Review of Systems  Constitutional: Negative for fever, chills and weight loss.  HENT: Negative for congestion.   Eyes: Negative for blurred vision and double vision.  Respiratory: Positive for cough, sputum production, shortness of breath and wheezing.   Cardiovascular: Negative for chest pain, palpitations, orthopnea, leg swelling and PND.  Gastrointestinal: Negative for nausea, vomiting, abdominal pain, diarrhea, constipation and blood in stool.  Genitourinary: Negative for dysuria, urgency, frequency and hematuria.  Musculoskeletal: Negative for falls.  Neurological: Negative for dizziness, tremors, focal weakness and headaches.  Endo/Heme/Allergies: Does not bruise/bleed easily.  Psychiatric/Behavioral: Negative for depression. The patient does not have insomnia.     VITAL SIGNS: Blood pressure 121/58, pulse 83, temperature 98.3 F (36.8 C), temperature source Oral, resp. rate 19,  height  (1.6 m), weight 80.105 kg (176 lb 9.6 oz), SpO2 96 %.  PHYSICAL EXAMINATION:   GENERAL:  81 y.o.-year-old patient lying in the bed with no acute distress.  somewhat somnolent, but able to arouse and converses,  competent .  EYES: Pupils equal, round, reactive to light and accommodation. No scleral icterus. Extraocular muscles intact.  HEENT: Head atraumatic, normocephalic. Oropharynx and nasopharynx clear.  NECK:  Supple, no jugular venous distention. No thyroid enlargement, no tenderness.  LUNGS: Normal breath soundon the left. However, patient does have wheezing in the right lower lobe, also diminished breath sounds. Some creps b/l lower lobes. Not using accessory muscles of respiration, on 2 ltr oxygen. CARDIOVASCULAR: S1, S2 , irregularly irregular, 3/6 systolic murmur was heard precordially, no radiation to axilla, however, to neck. No  rubs, or gallops.  ABDOMEN: Soft, nontender, nondistended. Bowel sounds present. No organomegaly or mass.  EXTREMITIES: No pedal edema, cyanosis, or clubbing.  NEUROLOGIC: Cranial nerves II through XII are intact. Muscle strength 4/5 in all extremities. Sensation intact. Gait not checked.  PSYCHIATRIC: The patient is alert,  oriented x 3.  SKIN: No obvious rash, lesion, or ulcer.   ORDERS/RESULTS REVIEWED:   CBC  Recent Labs Lab 09/16/14 0240 09/17/14 0733  WBC 15.2* 9.3  HGB 11.1* 9.7*  HCT 34.0* 28.7*  PLT 267 201  MCV 89.0 89.1  MCH 29.0 30.1  MCHC 32.6 33.7  RDW 15.9* 15.8*   ------------------------------------------------------------------------------------------------------------------  Chemistries   Recent Labs Lab 09/16/14 0240 09/17/14 0733  NA 133* 136  K 4.2 3.1*  CL 83* 89*  CO2 36* 39*  GLUCOSE 214* 130*  BUN 83* 60*  CREATININE 1.78* 1.11*  CALCIUM 10.1 9.4   ------------------------------------------------------------------------------------------------------------------ estimated creatinine  clearance is 41.2 mL/min (by C-G formula based on Cr of 1.11). ------------------------------------------------------------------------------------------------------------------  Recent Labs  09/16/14 0240  TSH 1.707    Cardiac Enzymes  Recent Labs Lab 09/16/14 1553 09/16/14 2329 09/17/14 0733  TROPONINI 0.06* 0.04* 0.04*   ------------------------------------------------------------------------------------------------------------------ Invalid input(s): POCBNP ---------------------------------------------------------------------------------------------------------------  RADIOLOGY: Dg Chest Port 1 View  09/16/2014   CLINICAL DATA:  Cough and difficulty breathing tonight.  EXAM: PORTABLE CHEST - 1 VIEW  COMPARISON:  09/01/2014  FINDINGS: Shallow inspiration. Cardiac enlargement. Mild vascular congestion. Perihilar infiltrates suggesting edema. No blunting of costophrenic angles. No pneumothorax. Calcification of the aorta. Postsurgical changes in the heart.  IMPRESSION: Cardiac enlargement with mild vascular congestion and suggestion of perihilar edema.   Electronically Signed   By: Burman Nieves M.D.   On: 09/16/2014 03:26    ASSESSMENT AND PLAN:  1. Acute on chronic respiratory failure likely due to COPD, asthma exacerbation, initiate oral steroids. Continue nebulizing therapy with the latter as well as duo nebs, continue oxygen therapy as needed to keep pulse oximeter is around 92-94%  on levaquin.   Her oxygen level is stable now, but she is still feeling same as she came with. 2. Asthma exacerbation as above. on steroids due to her wheezing. Follow clinically 3. Acute bronchitis. Patient reports yellow phlegm.  on levofloxacin 4. Acute on chronic renal failure likely ATN, follow with IV fluid administration, holding diuretics, improved renal func today, stopped IV fluids. Will decrease diuretic dose on d/c. 5. History of superficial thrombophlebitis, continue patient on  aspirin therapy 6. History of chronic diastolic CHF, watch for fluid overload.  7. A fib - on carvedilol and digoxin for rate control.  Management plans discussed with the patient- she would like to go place other than [facility]- Informed Child psychotherapist.  DRUG ALLERGIES:  Allergies  Allergen Reactions  . Hydrocodone-Acetaminophen Nausea Only    CODE STATUS:     Code Status Orders        Start     Ordered   09/16/14 0747  Full code   Continuous     09/16/14 0746      TOTAL TIME TAKING CARE OF THIS PATIENT: 30 minutes.  Likely d/c tomorrow.  Altamese Dilling M.D on 09/17/2014 at 10:13 AM  Between 7am to 6pm - Pager - [phone]  After 6pm go to [email or web address] - password [email or web address]  [place] [facility]  Office  [phone]  CC: Primary care physician; Patrice Paradise, MD

## 2014-09-17 NOTE — Clinical Social Work Note (Signed)
CSW spoke to pt's daughter.  She is interested in medicaid coverage to help with 20% medicare co-pays.  Pt is nearing her 20 day limit for co pay and may need LTC.  CSW connected Pam to [facility] in [place].  Also pt's daughter has accepted bed at [facility].  CSW will continue to follow

## 2014-09-17 NOTE — Progress Notes (Signed)
Per MD [place] give 14 units novolog for FSBS 442

## 2014-09-17 NOTE — Care Management (Signed)
Presents from [employer].  She is on chronic 02 and 02 sats were in the 70's upon arrival to the ED.  02 sats have improved.  Started on oral antibiotics today and oral steroids.  Would anticipate return to skilled within next 36 hours

## 2014-09-17 NOTE — Progress Notes (Signed)
Per MD Elisabeth Pigeon give 18 units insulin for critical blood sugar

## 2014-09-17 NOTE — Progress Notes (Signed)
Per MD Elisabeth Pigeon stop heparin and ASA for now d/t epistaxis, MD is aware of K 3.1, pt receiving adequate oral replacement

## 2014-09-18 ENCOUNTER — Encounter
Admission: RE | Admit: 2014-09-18 | Discharge: 2014-09-18 | Disposition: A | Discharge status: Home / Self Care | Payer: Medicare Other | Source: Ambulatory Visit | Attending: Internal Medicine | Admitting: Internal Medicine

## 2014-09-18 DIAGNOSIS — E440 Moderate protein-calorie malnutrition: Secondary | ICD-10-CM | POA: Insufficient documentation

## 2014-09-18 DIAGNOSIS — R300 Dysuria: Secondary | ICD-10-CM | POA: Insufficient documentation

## 2014-09-18 DIAGNOSIS — J9621 Acute and chronic respiratory failure with hypoxia: Secondary | ICD-10-CM | POA: Diagnosis not present

## 2014-09-18 LAB — GLUCOSE, CAPILLARY
Glucose-Capillary: 163 mg/dL — ABNORMAL HIGH (ref 65–99)
Glucose-Capillary: 141 mg/dL — ABNORMAL HIGH (ref 65–99)
Glucose-Capillary: 305 mg/dL — ABNORMAL HIGH (ref 65–99)

## 2014-09-18 MED ORDER — CEFUROXIME AXETIL 250 MG PO TABS: 250.0000 mg | ORAL_TABLET | Freq: Two times a day (BID) | ORAL | Status: DC

## 2014-09-18 MED ORDER — FUROSEMIDE 40 MG PO TABS
20.0000 mg | ORAL_TABLET | Freq: Every day | ORAL | Status: DC
Start: 2014-09-18 — End: 2014-12-06

## 2014-09-18 MED ORDER — DIGOXIN 62.5 MCG PO TABS: 0.0625 mg | ORAL_TABLET | Freq: Every day | ORAL | Status: DC

## 2014-09-18 MED ORDER — GABAPENTIN 300 MG PO CAPS: 300.0000 mg | ORAL_CAPSULE | Freq: Every day | ORAL | Status: AC

## 2014-09-18 MED ORDER — PREDNISONE 10 MG PO TABS: ORAL_TABLET | ORAL | Status: DC

## 2014-09-18 NOTE — Clinical Social Work Note (Signed)
CSW verified that [facility] would still be able to offer bed to pt for rehab even with SNF days used.

## 2014-09-18 NOTE — Care Management Important Message (Signed)
Important Message  Patient Details  Name: Mindy Cook MRN: [id number] Date of Birth: [birth year]   Medicare Important Message Given:  Yes-second notification given    Olegario Messier A Allmond 09/18/2014, 10:46 AM

## 2014-09-18 NOTE — Clinical Social Work Note (Signed)
CSW notified RN, facility, pt and pt's son and daughter that pt would DC to [facility] today via EMS.  CSW signing off unless further needs arise.

## 2014-09-18 NOTE — Discharge Summary (Signed)
[facility] - [place] at [facility]   PATIENT NAME: Mindy Cook    MR#:  [id number]  DATE OF BIRTH:  Jun 07, 1933  DATE OF ADMISSION:  09/16/2014 ADMITTING PHYSICIAN: Arnaldo Natal, MD  DATE OF DISCHARGE: 09/18/2014  PRIMARY CARE PHYSICIAN: Patrice Paradise, MD    ADMISSION DIAGNOSIS:  Acute pulmonary edema [J81.0] Elevated troponin [R79.89] Atrial fibrillation with rapid ventricular response [I48.91]  DISCHARGE DIAGNOSIS:  Active Problems:   Hypoxemia   Pressure ulcer   Malnutrition of moderate degree   COPD exacerbation  SECONDARY DIAGNOSIS:   Past Medical History  Diagnosis Date  . Asthma   . A-fib   . Arthritis   . Aortic stenosis   . Aortic insufficiency   . Hyperlipidemia   . Hypertension   . Sleep apnea   . CHF (congestive heart failure)   . History of DVT (deep vein thrombosis)   . Diabetes mellitus type 2, uncontrolled   . COPD (chronic obstructive pulmonary disease)   . Cervical spondylolysis   . DDD (degenerative disc disease), cervical   . Anemia   . Mitral valve disorder   . Anxiety   . Chronic gout   . Restless leg syndrome   . Osteoarthritis     HOSPITAL COURSE:    1. Acute on chronic respiratory failure likely due to COPD, asthma exacerbation, initiate oral steroids. Continue nebulizing therapy with the latter as well as duo nebs, continue oxygen therapy as needed to keep pulse oximeter is around 92-94% on levaquin. Changed to cefuroxime on d/c to cover for UTI.  Feels better now. 2. Asthma exacerbation as above. on steroids due to her wheezing. Follow clinically 3. Acute bronchitis. Patient reports yellow phlegm.  on cefuroxime. 4. Acute on chronic renal failure likely ATN, follow with IV fluid administration, holding diuretics, improved renal func today, stopped IV fluids.  decrease diuretic dose on d/c. 5. History of superficial thrombophlebitis, continue patient on aspirin therapy 6. History of chronic  diastolic CHF, watch for fluid overload.  7. A fib - on carvedilol and digoxin for rate control. 8. UTI- UA positive plus have some Burning in urine- Cefuroxime on d/c  DISCHARGE CONDITIONS:   Stable.  CONSULTS OBTAINED:  Treatment Team:  Altamese Dilling, MD  DRUG ALLERGIES:   Allergies  Allergen Reactions  . Hydrocodone-Acetaminophen Nausea Only    DISCHARGE MEDICATIONS:   Current Discharge Medication List    START taking these medications   Details  cefUROXime (CEFTIN) 250 MG tablet Take 1 tablet (250 mg total) by mouth 2 (two) times daily with a meal. Qty: 8 tablet, Refills: 0    !! gabapentin (NEURONTIN) 300 MG capsule Take 1 capsule (300 mg total) by mouth at bedtime. Qty: 30 capsule, Refills: 0    predniSONE (DELTASONE) 10 MG tablet Take 6 tabs first day, 5 tab on day 2, then 4 on day 3rd, 3 tabs on day 4th , 2 tab on day 5th, and 1 tab on 6th day. Qty: 21 tablet, Refills: 0     !! - Potential duplicate medications found. Please discuss with provider.    CONTINUE these medications which have NOT CHANGED   Details  allopurinol (ZYLOPRIM) 100 MG tablet Take 100 mg by mouth daily.    Cranberry 250 MG TABS Take 500 mg by mouth daily.    insulin regular (NOVOLIN R,HUMULIN R) 100 units/mL injection Inject into the skin daily. Per sliding scale    lactulose (CHRONULAC) 10 GM/15ML solution Take 10  g by mouth 2 (two) times daily as needed for mild constipation.    potassium chloride SA (K-DUR,KLOR-CON) 20 MEQ tablet Take 20 mEq by mouth 2 (two) times daily.    Probiotic Product (PROBIOTIC DAILY PO) Take 250 mg by mouth 2 (two) times daily.    sitaGLIPtin (JANUVIA) 50 MG tablet Take 50 mg by mouth daily.    ALPRAZolam (XANAX) 0.25 MG tablet Take 1 tablet (0.25 mg total) by mouth every evening. Qty: 30 tablet, Refills: 0    azelastine (ASTELIN) 0.1 % nasal spray Place 1 spray into both nostrils 2 (two) times daily.    carvedilol (COREG) 3.125 MG tablet Take  3.125 mg by mouth 2 (two) times daily.    clotrimazole (LOTRIMIN) 1 % cream Apply 1 application topically 2 (two) times daily. Pt mixes with zinc oxide cream.    cyclobenzaprine (FLEXERIL) 5 MG tablet Take 5 mg by mouth 3 (three) times daily as needed for muscle spasms.    diclofenac sodium (VOLTAREN) 1 % GEL Apply 2 g topically 2 (two) times daily.    docusate sodium (COLACE) 100 MG capsule Take 1 capsule (100 mg total) by mouth 2 (two) times daily. Qty: 10 capsule, Refills: 0    ferrous sulfate 325 (65 FE) MG tablet Take 325 mg by mouth daily.    fluticasone (FLONASE) 50 MCG/ACT nasal spray Place 2 sprays into both nostrils daily as needed for allergies or rhinitis.    Fluticasone-Salmeterol (ADVAIR) 250-50 MCG/DOSE AEPB Inhale 1 puff into the lungs 2 (two) times daily.    furosemide (LASIX) 40 MG tablet Take 40 mg by mouth 2 (two) times daily.    !! gabapentin (NEURONTIN) 300 MG capsule Take 600 mg by mouth 3 (three) times daily. Pt is able to take one extra capsule at bedtime.    glimepiride (AMARYL) 1 MG tablet Take 1 mg by mouth 2 (two) times daily.    ipratropium-albuterol (DUONEB) 0.5-2.5 (3) MG/3ML SOLN Take 3 mLs by nebulization every 4 (four) hours as needed. Qty: 360 mL, Refills: 1    meclizine (ANTIVERT) 12.5 MG tablet Take 12.5 mg by mouth 3 (three) times daily as needed for dizziness.    metolazone (ZAROXOLYN) 5 MG tablet Take 5 mg by mouth 3 (three) times a week. Pt takes on Monday, Thursday, and Sunday.    mirabegron ER (MYRBETRIQ) 50 MG TB24 tablet Take 1 tablet (50 mg total) by mouth daily. Qty: 30 tablet, Refills: 2    mometasone (NASONEX) 50 MCG/ACT nasal spray Place 1 spray into the nose daily.     montelukast (SINGULAIR) 10 MG tablet Take 10 mg by mouth daily.    nitrofurantoin (MACRODANTIN) 50 MG capsule Take 1 capsule (50 mg total) by mouth at bedtime. Qty: 90 capsule, Refills: 0    polyethylene glycol (MIRALAX / GLYCOLAX) packet Take 17 g by mouth  daily. Qty: 14 each, Refills: 0    pravastatin (PRAVACHOL) 40 MG tablet Take 40 mg by mouth at bedtime.    promethazine (PHENERGAN) 12.5 MG tablet Take 12.5 mg by mouth every 8 (eight) hours as needed for nausea or vomiting.    rOPINIRole (REQUIP) 0.25 MG tablet Take 0.25 mg by mouth at bedtime.    senna (SENOKOT) 8.6 MG TABS tablet Take 1 tablet (8.6 mg total) by mouth at bedtime as needed for mild constipation. Qty: 120 each, Refills: 0    traMADol (ULTRAM) 50 MG tablet Take 1 tablet (50 mg total) by mouth every 6 (six) hours as  needed for moderate pain. Qty: 30 tablet, Refills: 0    ZINC OXIDE, TOPICAL, 10 % CREA Apply 1 application topically 2 (two) times daily. Pt mixes with Clotrimazole cream.     !! - Potential duplicate medications found. Please discuss with provider.    STOP taking these medications     valACYclovir (VALTREX) 1000 MG tablet      azithromycin (ZITHROMAX) 250 MG tablet      methylPREDNISolone (MEDROL DOSEPAK) 4 MG TBPK tablet      methylPREDNISolone (MEDROL DOSEPAK) 4 MG TBPK tablet          DISCHARGE INSTRUCTIONS:    Follow with PMD , check renal function in 1 week.  If you experience worsening of your admission symptoms, develop shortness of breath, life threatening emergency, suicidal or homicidal thoughts you must seek medical attention immediately by calling 911 or calling your MD immediately  if symptoms less severe.  You Must read complete instructions/literature along with all the possible adverse reactions/side effects for all the Medicines you take and that have been prescribed to you. Take any new Medicines after you have completely understood and accept all the possible adverse reactions/side effects.   Please note  You were cared for by a hospitalist during your hospital stay. If you have any questions about your discharge medications or the care you received while you were in the hospital after you are discharged, you can call the unit  and asked to speak with the hospitalist on call if the hospitalist that took care of you is not available. Once you are discharged, your primary care physician will handle any further medical issues. Please note that NO REFILLS for any discharge medications will be authorized once you are discharged, as it is imperative that you return to your primary care physician (or establish a relationship with a primary care physician if you do not have one) for your aftercare needs so that they can reassess your need for medications and monitor your lab values.    Today   CHIEF COMPLAINT:   Chief Complaint  Patient presents with  . Shortness of Breath    HISTORY OF PRESENT ILLNESS:  Mindy Cook  is a 81 y.o. female presents emergency department via EMS from [facility] where she was found to be hypoxic into the low 70s by the facility staff. Earlier in the evening the patient's daughter had been concerned that her mother was listless and difficult to arouse prior to supper. She notes that the patient was then uncoordinated and speaking incoherently at times. Gradually however as she was aroused she became more alert and coherent. The patient only admits to lower extremity and ankle pain. She denies nausea, vomiting, chest pain or cough. She admits to feeling shortness of breath at times but this is baseline for her as she is always on 2 L of oxygen via nasal cannula. In the emergency department the patient was found to have atrial fibrillation with rapid ventricular rate upon arrival. Heart rate was found to be approximately 120 but the tachyarrhythmia broke without intervention. Both oxygen saturations recorded in the emergency department 88% on 2 L of oxygen nasal cannula. Due to the patient's increased oxygen requirement and documented arrhythmia emergency department staff called for admission.    VITAL SIGNS:  Blood pressure 126/49, pulse 84, temperature 98.2 F (36.8 C), temperature source Oral,  resp. rate 18, height 5\' 3"  (1.6 m), weight 83.643 kg (184 lb 6.4 oz), SpO2 97 %.  I/O:  Intake/Output Summary (Last 24 hours) at 09/18/14 1317 Last data filed at 09/18/14 0418  Gross per 24 hour  Intake    120 ml  Output    525 ml  Net   -405 ml    PHYSICAL EXAMINATION:   GENERAL: 81 y.o.-year-old patient lying in the bed with no acute distress. somewhat somnolent, but able to arouse and converses, competent .  EYES: Pupils equal, round, reactive to light and accommodation. No scleral icterus. Extraocular muscles intact.  HEENT: Head atraumatic, normocephalic. Oropharynx and nasopharynx clear.  NECK: Supple, no jugular venous distention. No thyroid enlargement, no tenderness.  LUNGS: Normal breath soundon the left. However, patient does have wheezing in the right lower lobe, also diminished breath sounds. Some creps b/l lower lobes. Not using accessory muscles of respiration, on 2 ltr oxygen. CARDIOVASCULAR: S1, S2 , irregularly irregular, 3/6 systolic murmur was heard precordially, no radiation to axilla, however, to neck. No rubs, or gallops.  ABDOMEN: Soft, nontender, nondistended. Bowel sounds present. No organomegaly or mass.  EXTREMITIES: No pedal edema, cyanosis, or clubbing.  NEUROLOGIC: Cranial nerves II through XII are intact. Muscle strength 4/5 in all extremities. Sensation intact. Gait not checked.  PSYCHIATRIC: The patient is alert, oriented x 3.  SKIN: No obvious rash, lesion, or ulcer.    DATA REVIEW:   CBC  Recent Labs Lab 09/17/14 0733  WBC 9.3  HGB 9.7*  HCT 28.7*  PLT 201    Chemistries   Recent Labs Lab 09/17/14 0733  NA 136  K 3.1*  CL 89*  CO2 39*  GLUCOSE 130*  BUN 60*  CREATININE 1.11*  CALCIUM 9.4    Cardiac Enzymes  Recent Labs Lab 09/17/14 0733  TROPONINI 0.04*    Microbiology Results  Results for orders placed or performed during the hospital encounter of 09/16/14  MRSA PCR Screening     Status: None    Collection Time: 09/16/14  2:25 AM  Result Value Ref Range Status   MRSA by PCR NEGATIVE NEGATIVE Final    Comment:        The GeneXpert MRSA Assay (FDA approved for NASAL specimens only), is one component of a comprehensive MRSA colonization surveillance program. It is not intended to diagnose MRSA infection nor to guide or monitor treatment for MRSA infections.     RADIOLOGY:  No results found.  EKG:   Orders placed or performed during the hospital encounter of 09/16/14  . EKG 12-Lead  . EKG 12-Lead  . ED EKG  . ED EKG      Management plans discussed with the patient, family and they are in agreement.  CODE STATUS:     Code Status Orders        Start     Ordered   09/16/14 0747  Full code   Continuous     09/16/14 0746      TOTAL TIME TAKING CARE OF THIS PATIENT: 35 minutes.    Altamese Dilling M.D on 09/18/2014 at 1:17 PM  Between 7am to 6pm - Pager - [phone]  After 6pm go to [email or web address] - password [email or web address] [facility]  [place]  Hospitalists  Office  [phone]  CC: Primary care physician; Patrice Paradise, MD

## 2014-09-18 NOTE — Progress Notes (Signed)
ANTIBIOTIC CONSULT NOTE - FOLLOW UP  Pharmacy Consult for Levofloxacin Indication: Pneumonia/COPD exacerbation   Allergies  Allergen Reactions  . Hydrocodone-Acetaminophen Nausea Only    Patient Measurements: Height:  (160 cm) Weight: 184 lb 6.4 oz (83.643 kg) IBW/kg (Calculated) : 52.4   Vital Signs: Temp: 98.2 F (36.8 C) (09/08 0405) Temp Source: Oral (09/08 0405) BP: 133/56 mmHg (09/08 0806) Pulse Rate: 80 (09/08 0806) Intake/Output from previous day: 09/07 0701 - 09/08 0700 In: 752.7 [P.O.:600; I.V.:152.7] Out: 525 [Urine:525] Intake/Output from this shift:    Labs:  Recent Labs  09/16/14 0240 09/17/14 0733  WBC 15.2* 9.3  HGB 11.1* 9.7*  PLT 267 201  CREATININE 1.78* 1.11*   Estimated Creatinine Clearance: 42.1 mL/min (by C-G formula based on Cr of 1.11). No results for input(s): VANCOTROUGH, VANCOPEAK, VANCORANDOM, GENTTROUGH, GENTPEAK, GENTRANDOM, TOBRATROUGH, TOBRAPEAK, TOBRARND, AMIKACINPEAK, AMIKACINTROU, AMIKACIN in the last 72 hours.   Microbiology: Recent Results (from the past 720 hour(s))  MRSA PCR Screening     Status: None   Collection Time: 09/16/14  2:25 AM  Result Value Ref Range Status   MRSA by PCR NEGATIVE NEGATIVE Final    Comment:        The GeneXpert MRSA Assay (FDA approved for NASAL specimens only), is one component of a comprehensive MRSA colonization surveillance program. It is not intended to diagnose MRSA infection nor to guide or monitor treatment for MRSA infections.     Anti-infectives    Start     Dose/Rate Route Frequency Ordered Stop   09/17/14 1800  levofloxacin (LEVAQUIN) tablet 250 mg  Status:  Discontinued     250 mg Oral Daily 09/16/14 1648 09/17/14 1120   09/17/14 1800  levofloxacin (LEVAQUIN) tablet 750 mg     750 mg Oral Every 48 hours 09/17/14 1120     09/16/14 1700  levofloxacin (LEVAQUIN) tablet 500 mg     500 mg Oral  Once 09/16/14 1648 09/16/14 1739   09/16/14 1645  levofloxacin (LEVAQUIN)  tablet 500 mg  Status:  Discontinued     500 mg Oral Daily 09/16/14 1639 09/16/14 1647   09/16/14 0415  levofloxacin (LEVAQUIN) IVPB 500 mg  Status:  Discontinued     500 mg 100 mL/hr over 60 Minutes Intravenous  Once 09/16/14 0400 09/16/14 0524      Assessment: Patient being treated for hypoxemia and COPD/Asthma exacerbation. Patient has been producing phlegm, CXR shows mild congestion . Patient was originally administered levofloxacin  on 09/16/14, dose was changed to  every 48 hours due to CrCl improvement.   Plan:  Patient is still receiving Levofloxacin  every 48 hours for pneumonia coverage. Will recommended discontinuation at day 5 of treatment (09/20/14). Pharmacy will continue to monitor renal function and follow up on therapy.   Cher Nakai, PharmD Pharmacy Resident

## 2014-09-18 NOTE — Progress Notes (Signed)
Inpatient Diabetes Program Recommendations  AACE/ADA: New Consensus Statement on Inpatient Glycemic Control (2013)  Target Ranges:  Prepandial:   less than 140 mg/dL      Peak postprandial:   less than 180 mg/dL (1-2 hours)      Critically ill patients:  140 - 180 mg/dL   Results for Mindy Cook, Mindy Cook (MRN [id number]) as of 09/18/2014 09:21  Ref. Range 09/17/2014 07:26 09/17/2014 11:42 09/17/2014 16:38 09/17/2014 16:41 09/17/2014 19:00 09/17/2014 20:20 09/18/2014 05:17 09/18/2014 07:41  Glucose-Capillary Latest Ref Range: 65-99 mg/dL 409 (H) 811 (H) 914 (HH) 492 (H) 442 (H) 381 (H) 163 (H) 141 (H)    Current orders for Inpatient glycemic control: Novolog 0-15 units TID with meals, Novolog 0-5 units HS, Tradjenta 5 mg daily  Inpatient Diabetes Program Recommendations Insulin - Meal Coverage: Patient continues to receive steroids which are contributing to hyperglyemia and noted elevated post prandial glucose. Please consider ordering Novolog 8 units TID with meals for meal coverage.  Thanks, Orlando Penner, RN, MSN, CCRN, CDE Diabetes Coordinator Inpatient Diabetes Program [phone] (Team Pager from 8am to 5pm) [phone] ([facility] office) [phone] [facility] office) [phone] [facility] office)

## 2014-09-18 NOTE — Progress Notes (Signed)
Patient d/c'd to [facility], Report given to [place]. Education provided, no questions at this time. Patient to be picked up by EMS. Telemetry removed. Trudee Kuster

## 2014-09-19 DIAGNOSIS — R300 Dysuria: Secondary | ICD-10-CM | POA: Diagnosis present

## 2014-09-19 LAB — GLUCOSE, CAPILLARY
GLUCOSE-CAPILLARY: 437 mg/dL — AB (ref 65–99)
Glucose-Capillary: 491 mg/dL — ABNORMAL HIGH (ref 65–99)
Glucose-Capillary: 317 mg/dL — ABNORMAL HIGH (ref 65–99)
Glucose-Capillary: 200 mg/dL — ABNORMAL HIGH (ref 65–99)
Glucose-Capillary: 171 mg/dL — ABNORMAL HIGH (ref 65–99)

## 2014-09-20 DIAGNOSIS — R300 Dysuria: Secondary | ICD-10-CM | POA: Diagnosis not present

## 2014-09-20 LAB — GLUCOSE, CAPILLARY
Glucose-Capillary: 396 mg/dL — ABNORMAL HIGH (ref 65–99)
Glucose-Capillary: 403 mg/dL — ABNORMAL HIGH (ref 65–99)
Glucose-Capillary: 233 mg/dL — ABNORMAL HIGH (ref 65–99)

## 2014-09-21 LAB — GLUCOSE, CAPILLARY
GLUCOSE-CAPILLARY: 404 mg/dL — AB (ref 65–99)
GLUCOSE-CAPILLARY: 528 mg/dL — AB (ref 65–99)
GLUCOSE-CAPILLARY: 85 mg/dL (ref 65–99)
Glucose-Capillary: 145 mg/dL — ABNORMAL HIGH (ref 65–99)
Glucose-Capillary: 165 mg/dL — ABNORMAL HIGH (ref 65–99)
Glucose-Capillary: 325 mg/dL — ABNORMAL HIGH (ref 65–99)
Glucose-Capillary: 350 mg/dL — ABNORMAL HIGH (ref 65–99)
Glucose-Capillary: 391 mg/dL — ABNORMAL HIGH (ref 65–99)

## 2014-09-22 DIAGNOSIS — R300 Dysuria: Secondary | ICD-10-CM | POA: Diagnosis not present

## 2014-09-23 LAB — GLUCOSE, CAPILLARY
GLUCOSE-CAPILLARY: 126 mg/dL — AB (ref 65–99)
GLUCOSE-CAPILLARY: 370 mg/dL — AB (ref 65–99)
GLUCOSE-CAPILLARY: 345 mg/dL — AB (ref 65–99)
GLUCOSE-CAPILLARY: 79 mg/dL (ref 65–99)
Glucose-Capillary: 280 mg/dL — ABNORMAL HIGH (ref 65–99)
Glucose-Capillary: 169 mg/dL — ABNORMAL HIGH (ref 65–99)
Glucose-Capillary: 246 mg/dL — ABNORMAL HIGH (ref 65–99)
Glucose-Capillary: 399 mg/dL — ABNORMAL HIGH (ref 65–99)

## 2014-09-24 DIAGNOSIS — R300 Dysuria: Secondary | ICD-10-CM | POA: Diagnosis not present

## 2014-09-24 LAB — GLUCOSE, CAPILLARY
GLUCOSE-CAPILLARY: 190 mg/dL — AB (ref 65–99)
GLUCOSE-CAPILLARY: 285 mg/dL — AB (ref 65–99)
Glucose-Capillary: 94 mg/dL (ref 65–99)

## 2014-09-25 DIAGNOSIS — R300 Dysuria: Secondary | ICD-10-CM | POA: Diagnosis not present

## 2014-09-25 LAB — GLUCOSE, CAPILLARY
GLUCOSE-CAPILLARY: 257 mg/dL — AB (ref 65–99)
Glucose-Capillary: 327 mg/dL — ABNORMAL HIGH (ref 65–99)
Glucose-Capillary: 125 mg/dL — ABNORMAL HIGH (ref 65–99)
Glucose-Capillary: 181 mg/dL — ABNORMAL HIGH (ref 65–99)

## 2014-09-26 DIAGNOSIS — R300 Dysuria: Secondary | ICD-10-CM | POA: Diagnosis not present

## 2014-09-26 LAB — GLUCOSE, CAPILLARY
Glucose-Capillary: 225 mg/dL — ABNORMAL HIGH (ref 65–99)
Glucose-Capillary: 99 mg/dL (ref 65–99)
Glucose-Capillary: 170 mg/dL — ABNORMAL HIGH (ref 65–99)
Glucose-Capillary: 344 mg/dL — ABNORMAL HIGH (ref 65–99)

## 2014-09-27 ENCOUNTER — Other Ambulatory Visit
Admission: RE | Admit: 2014-09-27 | Discharge: 2014-09-27 | Disposition: A | Discharge status: Home / Self Care | Payer: Medicare Other | Source: Ambulatory Visit | Attending: Internal Medicine | Admitting: Internal Medicine

## 2014-09-27 DIAGNOSIS — R410 Disorientation, unspecified: Secondary | ICD-10-CM | POA: Diagnosis present

## 2014-09-27 DIAGNOSIS — R300 Dysuria: Secondary | ICD-10-CM | POA: Diagnosis not present

## 2014-09-27 LAB — GLUCOSE, CAPILLARY: GLUCOSE-CAPILLARY: 358 mg/dL — AB (ref 65–99)

## 2014-09-28 DIAGNOSIS — R300 Dysuria: Secondary | ICD-10-CM | POA: Diagnosis not present

## 2014-09-28 LAB — URINALYSIS COMPLETE WITH MICROSCOPIC (ARMC ONLY)
Bilirubin Urine: NEGATIVE
Glucose, UA: >500 mg/dL — AB
HGB URINE DIPSTICK: NEGATIVE
Ketones, ur: NEGATIVE mg/dL
Nitrite: NEGATIVE
PH: 5 (ref 5.0–8.0)
PROTEIN: NEGATIVE mg/dL
SPECIFIC GRAVITY, URINE: 1.022 (ref 1.005–1.030)

## 2014-09-28 LAB — GLUCOSE, CAPILLARY
GLUCOSE-CAPILLARY: 129 mg/dL — AB (ref 65–99)
GLUCOSE-CAPILLARY: 227 mg/dL — AB (ref 65–99)
GLUCOSE-CAPILLARY: 466 mg/dL — AB (ref 65–99)
GLUCOSE-CAPILLARY: 525 mg/dL — AB (ref 65–99)
GLUCOSE-CAPILLARY: 75 mg/dL (ref 65–99)
Glucose-Capillary: 361 mg/dL — ABNORMAL HIGH (ref 65–99)
Glucose-Capillary: >600 mg/dL (ref 65–99)
Glucose-Capillary: 513 mg/dL — ABNORMAL HIGH (ref 65–99)
Glucose-Capillary: 569 mg/dL (ref 65–99)
Glucose-Capillary: 121 mg/dL — ABNORMAL HIGH (ref 65–99)
Glucose-Capillary: 230 mg/dL — ABNORMAL HIGH (ref 65–99)
Glucose-Capillary: 302 mg/dL — ABNORMAL HIGH (ref 65–99)

## 2014-09-29 DIAGNOSIS — R300 Dysuria: Secondary | ICD-10-CM | POA: Diagnosis not present

## 2014-09-29 LAB — URINE CULTURE

## 2014-09-29 LAB — GLUCOSE, CAPILLARY: GLUCOSE-CAPILLARY: 346 mg/dL — AB (ref 65–99)

## 2014-09-30 DIAGNOSIS — R300 Dysuria: Secondary | ICD-10-CM | POA: Diagnosis not present

## 2014-09-30 LAB — URINALYSIS COMPLETE WITH MICROSCOPIC (ARMC ONLY)
BACTERIA UA: NONE SEEN
BILIRUBIN URINE: NEGATIVE
Glucose, UA: NEGATIVE mg/dL
Hgb urine dipstick: NEGATIVE
KETONES UR: NEGATIVE mg/dL
NITRITE: NEGATIVE
PROTEIN: NEGATIVE mg/dL
Specific Gravity, Urine: 1.014 (ref 1.005–1.030)
pH: 5 (ref 5.0–8.0)

## 2014-09-30 LAB — GLUCOSE, CAPILLARY
GLUCOSE-CAPILLARY: 169 mg/dL — AB (ref 65–99)
GLUCOSE-CAPILLARY: 193 mg/dL — AB (ref 65–99)
Glucose-Capillary: 96 mg/dL (ref 65–99)
Glucose-Capillary: 196 mg/dL — ABNORMAL HIGH (ref 65–99)

## 2014-10-01 LAB — GLUCOSE, CAPILLARY
GLUCOSE-CAPILLARY: 115 mg/dL — AB (ref 65–99)
GLUCOSE-CAPILLARY: 195 mg/dL — AB (ref 65–99)
GLUCOSE-CAPILLARY: 225 mg/dL — AB (ref 65–99)
Glucose-Capillary: 160 mg/dL — ABNORMAL HIGH (ref 65–99)
Glucose-Capillary: 230 mg/dL — ABNORMAL HIGH (ref 65–99)
Glucose-Capillary: 223 mg/dL — ABNORMAL HIGH (ref 65–99)
Glucose-Capillary: 135 mg/dL — ABNORMAL HIGH (ref 65–99)
Glucose-Capillary: 173 mg/dL — ABNORMAL HIGH (ref 65–99)

## 2014-10-03 DIAGNOSIS — R300 Dysuria: Secondary | ICD-10-CM | POA: Diagnosis not present

## 2014-10-03 LAB — URINE CULTURE: Culture: 50000

## 2014-10-03 LAB — GLUCOSE, CAPILLARY
GLUCOSE-CAPILLARY: 155 mg/dL — AB (ref 65–99)
GLUCOSE-CAPILLARY: 183 mg/dL — AB (ref 65–99)
GLUCOSE-CAPILLARY: 213 mg/dL — AB (ref 65–99)
GLUCOSE-CAPILLARY: 215 mg/dL — AB (ref 65–99)
GLUCOSE-CAPILLARY: 235 mg/dL — AB (ref 65–99)
Glucose-Capillary: 219 mg/dL — ABNORMAL HIGH (ref 65–99)
Glucose-Capillary: 144 mg/dL — ABNORMAL HIGH (ref 65–99)
Glucose-Capillary: 181 mg/dL — ABNORMAL HIGH (ref 65–99)
Glucose-Capillary: 282 mg/dL — ABNORMAL HIGH (ref 65–99)

## 2014-10-04 DIAGNOSIS — R300 Dysuria: Secondary | ICD-10-CM | POA: Diagnosis not present

## 2014-10-05 LAB — GLUCOSE, CAPILLARY
GLUCOSE-CAPILLARY: 159 mg/dL — AB (ref 65–99)
GLUCOSE-CAPILLARY: 178 mg/dL — AB (ref 65–99)
Glucose-Capillary: 126 mg/dL — ABNORMAL HIGH (ref 65–99)
Glucose-Capillary: 157 mg/dL — ABNORMAL HIGH (ref 65–99)
Glucose-Capillary: 233 mg/dL — ABNORMAL HIGH (ref 65–99)
Glucose-Capillary: 135 mg/dL — ABNORMAL HIGH (ref 65–99)
Glucose-Capillary: 219 mg/dL — ABNORMAL HIGH (ref 65–99)

## 2014-10-06 DIAGNOSIS — R300 Dysuria: Secondary | ICD-10-CM | POA: Diagnosis not present

## 2014-10-06 LAB — GLUCOSE, CAPILLARY: GLUCOSE-CAPILLARY: 191 mg/dL — AB (ref 65–99)

## 2014-10-07 DIAGNOSIS — R300 Dysuria: Secondary | ICD-10-CM | POA: Diagnosis not present

## 2014-10-07 LAB — GLUCOSE, CAPILLARY
GLUCOSE-CAPILLARY: 172 mg/dL — AB (ref 65–99)
GLUCOSE-CAPILLARY: 228 mg/dL — AB (ref 65–99)
Glucose-Capillary: 156 mg/dL — ABNORMAL HIGH (ref 65–99)
Glucose-Capillary: 243 mg/dL — ABNORMAL HIGH (ref 65–99)
Glucose-Capillary: 129 mg/dL — ABNORMAL HIGH (ref 65–99)

## 2014-10-08 LAB — GLUCOSE, CAPILLARY
GLUCOSE-CAPILLARY: 185 mg/dL — AB (ref 65–99)
Glucose-Capillary: 125 mg/dL — ABNORMAL HIGH (ref 65–99)

## 2014-10-09 DIAGNOSIS — R300 Dysuria: Secondary | ICD-10-CM | POA: Diagnosis not present

## 2014-10-09 LAB — GLUCOSE, CAPILLARY
GLUCOSE-CAPILLARY: 236 mg/dL — AB (ref 65–99)
GLUCOSE-CAPILLARY: 163 mg/dL — AB (ref 65–99)
GLUCOSE-CAPILLARY: 116 mg/dL — AB (ref 65–99)
GLUCOSE-CAPILLARY: 127 mg/dL — AB (ref 65–99)

## 2014-10-10 DIAGNOSIS — R300 Dysuria: Secondary | ICD-10-CM | POA: Diagnosis not present

## 2014-10-10 LAB — GLUCOSE, CAPILLARY
GLUCOSE-CAPILLARY: 100 mg/dL — AB (ref 65–99)
GLUCOSE-CAPILLARY: 172 mg/dL — AB (ref 65–99)
GLUCOSE-CAPILLARY: 263 mg/dL — AB (ref 65–99)
Glucose-Capillary: 170 mg/dL — ABNORMAL HIGH (ref 65–99)
Glucose-Capillary: 321 mg/dL — ABNORMAL HIGH (ref 65–99)

## 2014-10-11 ENCOUNTER — Encounter
Admission: RE | Admit: 2014-10-11 | Discharge: 2014-10-11 | Disposition: A | Discharge status: Home / Self Care | Payer: Medicare Other | Source: Ambulatory Visit | Attending: Internal Medicine | Admitting: Internal Medicine

## 2014-10-11 DIAGNOSIS — Z794 Long term (current) use of insulin: Secondary | ICD-10-CM | POA: Diagnosis not present

## 2014-10-11 DIAGNOSIS — E119 Type 2 diabetes mellitus without complications: Secondary | ICD-10-CM | POA: Insufficient documentation

## 2014-10-11 LAB — GLUCOSE, CAPILLARY
Glucose-Capillary: 126 mg/dL — ABNORMAL HIGH (ref 65–99)
Glucose-Capillary: 239 mg/dL — ABNORMAL HIGH (ref 65–99)
Glucose-Capillary: 164 mg/dL — ABNORMAL HIGH (ref 65–99)

## 2014-10-12 LAB — GLUCOSE, CAPILLARY
GLUCOSE-CAPILLARY: 116 mg/dL — AB (ref 65–99)
GLUCOSE-CAPILLARY: 162 mg/dL — AB (ref 65–99)
Glucose-Capillary: 85 mg/dL (ref 65–99)
Glucose-Capillary: 151 mg/dL — ABNORMAL HIGH (ref 65–99)
Glucose-Capillary: 180 mg/dL — ABNORMAL HIGH (ref 65–99)

## 2014-10-13 DIAGNOSIS — E119 Type 2 diabetes mellitus without complications: Secondary | ICD-10-CM | POA: Diagnosis not present

## 2014-10-13 LAB — GLUCOSE, CAPILLARY
Glucose-Capillary: 115 mg/dL — ABNORMAL HIGH (ref 65–99)
Glucose-Capillary: 147 mg/dL — ABNORMAL HIGH (ref 65–99)
Glucose-Capillary: 201 mg/dL — ABNORMAL HIGH (ref 65–99)

## 2014-10-14 DIAGNOSIS — E119 Type 2 diabetes mellitus without complications: Secondary | ICD-10-CM | POA: Diagnosis not present

## 2014-10-14 LAB — GLUCOSE, CAPILLARY
GLUCOSE-CAPILLARY: 182 mg/dL — AB (ref 65–99)
GLUCOSE-CAPILLARY: 178 mg/dL — AB (ref 65–99)
Glucose-Capillary: 167 mg/dL — ABNORMAL HIGH (ref 65–99)

## 2014-10-15 LAB — GLUCOSE, CAPILLARY
GLUCOSE-CAPILLARY: 130 mg/dL — AB (ref 65–99)
Glucose-Capillary: 195 mg/dL — ABNORMAL HIGH (ref 65–99)
Glucose-Capillary: 146 mg/dL — ABNORMAL HIGH (ref 65–99)
Glucose-Capillary: 174 mg/dL — ABNORMAL HIGH (ref 65–99)

## 2014-10-16 DIAGNOSIS — E119 Type 2 diabetes mellitus without complications: Secondary | ICD-10-CM | POA: Diagnosis not present

## 2014-10-16 LAB — GLUCOSE, CAPILLARY
GLUCOSE-CAPILLARY: 190 mg/dL — AB (ref 65–99)
GLUCOSE-CAPILLARY: 108 mg/dL — AB (ref 65–99)
GLUCOSE-CAPILLARY: 175 mg/dL — AB (ref 65–99)
Glucose-Capillary: 114 mg/dL — ABNORMAL HIGH (ref 65–99)

## 2014-10-17 DIAGNOSIS — E119 Type 2 diabetes mellitus without complications: Secondary | ICD-10-CM | POA: Diagnosis not present

## 2014-10-17 LAB — GLUCOSE, CAPILLARY
GLUCOSE-CAPILLARY: 168 mg/dL — AB (ref 65–99)
GLUCOSE-CAPILLARY: 187 mg/dL — AB (ref 65–99)
Glucose-Capillary: 138 mg/dL — ABNORMAL HIGH (ref 65–99)
Glucose-Capillary: 115 mg/dL — ABNORMAL HIGH (ref 65–99)
Glucose-Capillary: 136 mg/dL — ABNORMAL HIGH (ref 65–99)

## 2014-10-18 DIAGNOSIS — E119 Type 2 diabetes mellitus without complications: Secondary | ICD-10-CM | POA: Diagnosis not present

## 2014-10-18 LAB — GLUCOSE, CAPILLARY
GLUCOSE-CAPILLARY: 126 mg/dL — AB (ref 65–99)
GLUCOSE-CAPILLARY: 231 mg/dL — AB (ref 65–99)
Glucose-Capillary: 201 mg/dL — ABNORMAL HIGH (ref 65–99)
Glucose-Capillary: 193 mg/dL — ABNORMAL HIGH (ref 65–99)

## 2014-10-19 LAB — GLUCOSE, CAPILLARY
Glucose-Capillary: 264 mg/dL — ABNORMAL HIGH (ref 65–99)
Glucose-Capillary: 112 mg/dL — ABNORMAL HIGH (ref 65–99)
Glucose-Capillary: 178 mg/dL — ABNORMAL HIGH (ref 65–99)
Glucose-Capillary: 171 mg/dL — ABNORMAL HIGH (ref 65–99)

## 2014-10-20 DIAGNOSIS — E119 Type 2 diabetes mellitus without complications: Secondary | ICD-10-CM | POA: Diagnosis not present

## 2014-10-20 LAB — GLUCOSE, CAPILLARY
GLUCOSE-CAPILLARY: 248 mg/dL — AB (ref 65–99)
GLUCOSE-CAPILLARY: 223 mg/dL — AB (ref 65–99)
GLUCOSE-CAPILLARY: 251 mg/dL — AB (ref 65–99)
Glucose-Capillary: 143 mg/dL — ABNORMAL HIGH (ref 65–99)
Glucose-Capillary: 171 mg/dL — ABNORMAL HIGH (ref 65–99)

## 2014-10-21 DIAGNOSIS — E119 Type 2 diabetes mellitus without complications: Secondary | ICD-10-CM | POA: Diagnosis not present

## 2014-10-21 LAB — GLUCOSE, CAPILLARY
GLUCOSE-CAPILLARY: 262 mg/dL — AB (ref 65–99)
Glucose-Capillary: 144 mg/dL — ABNORMAL HIGH (ref 65–99)
Glucose-Capillary: 192 mg/dL — ABNORMAL HIGH (ref 65–99)
Glucose-Capillary: 231 mg/dL — ABNORMAL HIGH (ref 65–99)

## 2014-10-22 ENCOUNTER — Ambulatory Visit: Payer: Self-pay

## 2014-10-22 DIAGNOSIS — E119 Type 2 diabetes mellitus without complications: Secondary | ICD-10-CM | POA: Diagnosis not present

## 2014-10-22 LAB — GLUCOSE, CAPILLARY
Glucose-Capillary: 171 mg/dL — ABNORMAL HIGH (ref 65–99)
Glucose-Capillary: 174 mg/dL — ABNORMAL HIGH (ref 65–99)

## 2014-10-23 DIAGNOSIS — E119 Type 2 diabetes mellitus without complications: Secondary | ICD-10-CM | POA: Diagnosis not present

## 2014-10-23 LAB — GLUCOSE, CAPILLARY
GLUCOSE-CAPILLARY: 205 mg/dL — AB (ref 65–99)
GLUCOSE-CAPILLARY: 178 mg/dL — AB (ref 65–99)
Glucose-Capillary: 218 mg/dL — ABNORMAL HIGH (ref 65–99)

## 2014-10-24 DIAGNOSIS — E119 Type 2 diabetes mellitus without complications: Secondary | ICD-10-CM | POA: Diagnosis not present

## 2014-10-24 LAB — GLUCOSE, CAPILLARY
GLUCOSE-CAPILLARY: 120 mg/dL — AB (ref 65–99)
GLUCOSE-CAPILLARY: 196 mg/dL — AB (ref 65–99)
GLUCOSE-CAPILLARY: 282 mg/dL — AB (ref 65–99)
Glucose-Capillary: 122 mg/dL — ABNORMAL HIGH (ref 65–99)

## 2014-10-25 DIAGNOSIS — E119 Type 2 diabetes mellitus without complications: Secondary | ICD-10-CM | POA: Diagnosis not present

## 2014-10-25 LAB — GLUCOSE, CAPILLARY
GLUCOSE-CAPILLARY: 153 mg/dL — AB (ref 65–99)
GLUCOSE-CAPILLARY: 291 mg/dL — AB (ref 65–99)
Glucose-Capillary: 78 mg/dL (ref 65–99)
Glucose-Capillary: 224 mg/dL — ABNORMAL HIGH (ref 65–99)

## 2014-10-26 DIAGNOSIS — E119 Type 2 diabetes mellitus without complications: Secondary | ICD-10-CM | POA: Diagnosis not present

## 2014-10-26 LAB — GLUCOSE, CAPILLARY
GLUCOSE-CAPILLARY: 151 mg/dL — AB (ref 65–99)
Glucose-Capillary: 106 mg/dL — ABNORMAL HIGH (ref 65–99)
Glucose-Capillary: 163 mg/dL — ABNORMAL HIGH (ref 65–99)

## 2014-10-27 DIAGNOSIS — E119 Type 2 diabetes mellitus without complications: Secondary | ICD-10-CM | POA: Diagnosis not present

## 2014-10-27 LAB — GLUCOSE, CAPILLARY
GLUCOSE-CAPILLARY: 219 mg/dL — AB (ref 65–99)
GLUCOSE-CAPILLARY: 141 mg/dL — AB (ref 65–99)
GLUCOSE-CAPILLARY: 89 mg/dL (ref 65–99)
Glucose-Capillary: 136 mg/dL — ABNORMAL HIGH (ref 65–99)
Glucose-Capillary: 120 mg/dL — ABNORMAL HIGH (ref 65–99)
Glucose-Capillary: 150 mg/dL — ABNORMAL HIGH (ref 65–99)

## 2014-10-28 DIAGNOSIS — E119 Type 2 diabetes mellitus without complications: Secondary | ICD-10-CM | POA: Diagnosis not present

## 2014-10-28 LAB — GLUCOSE, CAPILLARY
GLUCOSE-CAPILLARY: 133 mg/dL — AB (ref 65–99)
GLUCOSE-CAPILLARY: 124 mg/dL — AB (ref 65–99)
Glucose-Capillary: 204 mg/dL — ABNORMAL HIGH (ref 65–99)

## 2014-10-29 LAB — GLUCOSE, CAPILLARY
GLUCOSE-CAPILLARY: 184 mg/dL — AB (ref 65–99)
GLUCOSE-CAPILLARY: 121 mg/dL — AB (ref 65–99)
GLUCOSE-CAPILLARY: 113 mg/dL — AB (ref 65–99)
GLUCOSE-CAPILLARY: 204 mg/dL — AB (ref 65–99)
Glucose-Capillary: 111 mg/dL — ABNORMAL HIGH (ref 65–99)

## 2014-10-30 DIAGNOSIS — E119 Type 2 diabetes mellitus without complications: Secondary | ICD-10-CM | POA: Diagnosis not present

## 2014-10-30 LAB — GLUCOSE, CAPILLARY
Glucose-Capillary: 139 mg/dL — ABNORMAL HIGH (ref 65–99)
Glucose-Capillary: 193 mg/dL — ABNORMAL HIGH (ref 65–99)
Glucose-Capillary: 214 mg/dL — ABNORMAL HIGH (ref 65–99)

## 2014-10-31 DIAGNOSIS — E119 Type 2 diabetes mellitus without complications: Secondary | ICD-10-CM | POA: Diagnosis not present

## 2014-10-31 LAB — GLUCOSE, CAPILLARY
GLUCOSE-CAPILLARY: 86 mg/dL (ref 65–99)
GLUCOSE-CAPILLARY: 150 mg/dL — AB (ref 65–99)
GLUCOSE-CAPILLARY: 149 mg/dL — AB (ref 65–99)
GLUCOSE-CAPILLARY: 193 mg/dL — AB (ref 65–99)

## 2014-11-01 LAB — GLUCOSE, CAPILLARY
GLUCOSE-CAPILLARY: 203 mg/dL — AB (ref 65–99)
GLUCOSE-CAPILLARY: 128 mg/dL — AB (ref 65–99)
Glucose-Capillary: 119 mg/dL — ABNORMAL HIGH (ref 65–99)
Glucose-Capillary: 229 mg/dL — ABNORMAL HIGH (ref 65–99)
Glucose-Capillary: 251 mg/dL — ABNORMAL HIGH (ref 65–99)

## 2014-11-02 DIAGNOSIS — E119 Type 2 diabetes mellitus without complications: Secondary | ICD-10-CM | POA: Diagnosis not present

## 2014-11-02 LAB — GLUCOSE, CAPILLARY
GLUCOSE-CAPILLARY: 154 mg/dL — AB (ref 65–99)
Glucose-Capillary: 145 mg/dL — ABNORMAL HIGH (ref 65–99)

## 2014-11-03 DIAGNOSIS — E119 Type 2 diabetes mellitus without complications: Secondary | ICD-10-CM | POA: Diagnosis not present

## 2014-11-03 LAB — GLUCOSE, CAPILLARY
Glucose-Capillary: 244 mg/dL — ABNORMAL HIGH (ref 65–99)
Glucose-Capillary: 241 mg/dL — ABNORMAL HIGH (ref 65–99)
Glucose-Capillary: 154 mg/dL — ABNORMAL HIGH (ref 65–99)
Glucose-Capillary: 197 mg/dL — ABNORMAL HIGH (ref 65–99)

## 2014-11-04 LAB — GLUCOSE, CAPILLARY
GLUCOSE-CAPILLARY: 91 mg/dL (ref 65–99)
GLUCOSE-CAPILLARY: 127 mg/dL — AB (ref 65–99)
Glucose-Capillary: 166 mg/dL — ABNORMAL HIGH (ref 65–99)
Glucose-Capillary: 248 mg/dL — ABNORMAL HIGH (ref 65–99)
Glucose-Capillary: 256 mg/dL — ABNORMAL HIGH (ref 65–99)

## 2014-11-05 DIAGNOSIS — E119 Type 2 diabetes mellitus without complications: Secondary | ICD-10-CM | POA: Diagnosis not present

## 2014-11-05 LAB — GLUCOSE, CAPILLARY
GLUCOSE-CAPILLARY: 154 mg/dL — AB (ref 65–99)
Glucose-Capillary: 164 mg/dL — ABNORMAL HIGH (ref 65–99)
Glucose-Capillary: 168 mg/dL — ABNORMAL HIGH (ref 65–99)

## 2014-11-06 DIAGNOSIS — E119 Type 2 diabetes mellitus without complications: Secondary | ICD-10-CM | POA: Diagnosis not present

## 2014-11-06 LAB — GLUCOSE, CAPILLARY
GLUCOSE-CAPILLARY: 164 mg/dL — AB (ref 65–99)
GLUCOSE-CAPILLARY: 225 mg/dL — AB (ref 65–99)
Glucose-Capillary: 208 mg/dL — ABNORMAL HIGH (ref 65–99)

## 2014-11-07 DIAGNOSIS — E119 Type 2 diabetes mellitus without complications: Secondary | ICD-10-CM | POA: Diagnosis not present

## 2014-11-07 LAB — GLUCOSE, CAPILLARY
GLUCOSE-CAPILLARY: 161 mg/dL — AB (ref 65–99)
Glucose-Capillary: 190 mg/dL — ABNORMAL HIGH (ref 65–99)
Glucose-Capillary: 237 mg/dL — ABNORMAL HIGH (ref 65–99)

## 2014-11-08 DIAGNOSIS — E119 Type 2 diabetes mellitus without complications: Secondary | ICD-10-CM | POA: Diagnosis not present

## 2014-11-08 LAB — GLUCOSE, CAPILLARY
GLUCOSE-CAPILLARY: 140 mg/dL — AB (ref 65–99)
GLUCOSE-CAPILLARY: 189 mg/dL — AB (ref 65–99)
GLUCOSE-CAPILLARY: 195 mg/dL — AB (ref 65–99)
Glucose-Capillary: 134 mg/dL — ABNORMAL HIGH (ref 65–99)

## 2014-11-09 LAB — GLUCOSE, CAPILLARY
GLUCOSE-CAPILLARY: 130 mg/dL — AB (ref 65–99)
GLUCOSE-CAPILLARY: 220 mg/dL — AB (ref 65–99)
GLUCOSE-CAPILLARY: 212 mg/dL — AB (ref 65–99)
Glucose-Capillary: 196 mg/dL — ABNORMAL HIGH (ref 65–99)
Glucose-Capillary: 169 mg/dL — ABNORMAL HIGH (ref 65–99)
Glucose-Capillary: 208 mg/dL — ABNORMAL HIGH (ref 65–99)

## 2014-11-10 DIAGNOSIS — E119 Type 2 diabetes mellitus without complications: Secondary | ICD-10-CM | POA: Diagnosis not present

## 2014-11-10 LAB — GLUCOSE, CAPILLARY: Glucose-Capillary: 135 mg/dL — ABNORMAL HIGH (ref 65–99)

## 2014-11-11 ENCOUNTER — Ambulatory Visit: Payer: Self-pay

## 2014-11-11 ENCOUNTER — Encounter
Admission: RE | Admit: 2014-11-11 | Discharge: 2014-11-11 | Disposition: A | Discharge status: Home / Self Care | Payer: Medicare Other | Source: Ambulatory Visit | Attending: Internal Medicine | Admitting: Internal Medicine

## 2014-11-11 DIAGNOSIS — E119 Type 2 diabetes mellitus without complications: Secondary | ICD-10-CM | POA: Insufficient documentation

## 2014-11-11 DIAGNOSIS — Z794 Long term (current) use of insulin: Secondary | ICD-10-CM | POA: Diagnosis not present

## 2014-11-11 LAB — GLUCOSE, CAPILLARY
GLUCOSE-CAPILLARY: 188 mg/dL — AB (ref 65–99)
GLUCOSE-CAPILLARY: 210 mg/dL — AB (ref 65–99)
Glucose-Capillary: 205 mg/dL — ABNORMAL HIGH (ref 65–99)
Glucose-Capillary: 212 mg/dL — ABNORMAL HIGH (ref 65–99)

## 2014-11-12 LAB — GLUCOSE, CAPILLARY
GLUCOSE-CAPILLARY: 202 mg/dL — AB (ref 65–99)
GLUCOSE-CAPILLARY: 117 mg/dL — AB (ref 65–99)
GLUCOSE-CAPILLARY: 221 mg/dL — AB (ref 65–99)
GLUCOSE-CAPILLARY: 185 mg/dL — AB (ref 65–99)
Glucose-Capillary: 193 mg/dL — ABNORMAL HIGH (ref 65–99)

## 2014-11-13 DIAGNOSIS — E119 Type 2 diabetes mellitus without complications: Secondary | ICD-10-CM | POA: Diagnosis not present

## 2014-11-13 LAB — GLUCOSE, CAPILLARY
GLUCOSE-CAPILLARY: 166 mg/dL — AB (ref 65–99)
Glucose-Capillary: 174 mg/dL — ABNORMAL HIGH (ref 65–99)
Glucose-Capillary: 225 mg/dL — ABNORMAL HIGH (ref 65–99)
Glucose-Capillary: 245 mg/dL — ABNORMAL HIGH (ref 65–99)

## 2014-11-14 DIAGNOSIS — E119 Type 2 diabetes mellitus without complications: Secondary | ICD-10-CM | POA: Diagnosis not present

## 2014-11-14 LAB — GLUCOSE, CAPILLARY
GLUCOSE-CAPILLARY: 129 mg/dL — AB (ref 65–99)
GLUCOSE-CAPILLARY: 147 mg/dL — AB (ref 65–99)
GLUCOSE-CAPILLARY: 175 mg/dL — AB (ref 65–99)

## 2014-11-15 DIAGNOSIS — E119 Type 2 diabetes mellitus without complications: Secondary | ICD-10-CM | POA: Diagnosis not present

## 2014-11-15 LAB — GLUCOSE, CAPILLARY
GLUCOSE-CAPILLARY: 136 mg/dL — AB (ref 65–99)
GLUCOSE-CAPILLARY: 191 mg/dL — AB (ref 65–99)
Glucose-Capillary: 185 mg/dL — ABNORMAL HIGH (ref 65–99)

## 2014-11-16 LAB — GLUCOSE, CAPILLARY
GLUCOSE-CAPILLARY: 196 mg/dL — AB (ref 65–99)
Glucose-Capillary: 214 mg/dL — ABNORMAL HIGH (ref 65–99)
Glucose-Capillary: 100 mg/dL — ABNORMAL HIGH (ref 65–99)
Glucose-Capillary: 182 mg/dL — ABNORMAL HIGH (ref 65–99)

## 2014-11-17 DIAGNOSIS — E119 Type 2 diabetes mellitus without complications: Secondary | ICD-10-CM | POA: Diagnosis not present

## 2014-11-17 LAB — GLUCOSE, CAPILLARY
Glucose-Capillary: 220 mg/dL — ABNORMAL HIGH (ref 65–99)
Glucose-Capillary: 121 mg/dL — ABNORMAL HIGH (ref 65–99)
Glucose-Capillary: 156 mg/dL — ABNORMAL HIGH (ref 65–99)
Glucose-Capillary: 195 mg/dL — ABNORMAL HIGH (ref 65–99)
Glucose-Capillary: 202 mg/dL — ABNORMAL HIGH (ref 65–99)

## 2014-11-18 DIAGNOSIS — E119 Type 2 diabetes mellitus without complications: Secondary | ICD-10-CM | POA: Diagnosis not present

## 2014-11-18 LAB — GLUCOSE, CAPILLARY
GLUCOSE-CAPILLARY: 109 mg/dL — AB (ref 65–99)
GLUCOSE-CAPILLARY: 200 mg/dL — AB (ref 65–99)
Glucose-Capillary: 222 mg/dL — ABNORMAL HIGH (ref 65–99)
Glucose-Capillary: 307 mg/dL — ABNORMAL HIGH (ref 65–99)

## 2014-11-19 DIAGNOSIS — E119 Type 2 diabetes mellitus without complications: Secondary | ICD-10-CM | POA: Diagnosis not present

## 2014-11-19 LAB — GLUCOSE, CAPILLARY
GLUCOSE-CAPILLARY: 170 mg/dL — AB (ref 65–99)
Glucose-Capillary: 135 mg/dL — ABNORMAL HIGH (ref 65–99)
Glucose-Capillary: 247 mg/dL — ABNORMAL HIGH (ref 65–99)
Glucose-Capillary: 265 mg/dL — ABNORMAL HIGH (ref 65–99)

## 2014-11-20 DIAGNOSIS — E119 Type 2 diabetes mellitus without complications: Secondary | ICD-10-CM | POA: Diagnosis not present

## 2014-11-20 LAB — GLUCOSE, CAPILLARY
Glucose-Capillary: 104 mg/dL — ABNORMAL HIGH (ref 65–99)
Glucose-Capillary: 142 mg/dL — ABNORMAL HIGH (ref 65–99)
Glucose-Capillary: 171 mg/dL — ABNORMAL HIGH (ref 65–99)

## 2014-11-21 DIAGNOSIS — E119 Type 2 diabetes mellitus without complications: Secondary | ICD-10-CM | POA: Diagnosis not present

## 2014-11-21 LAB — GLUCOSE, CAPILLARY
GLUCOSE-CAPILLARY: 108 mg/dL — AB (ref 65–99)
GLUCOSE-CAPILLARY: 167 mg/dL — AB (ref 65–99)
GLUCOSE-CAPILLARY: 228 mg/dL — AB (ref 65–99)
Glucose-Capillary: 366 mg/dL — ABNORMAL HIGH (ref 65–99)

## 2014-11-22 DIAGNOSIS — E119 Type 2 diabetes mellitus without complications: Secondary | ICD-10-CM | POA: Diagnosis not present

## 2014-11-22 LAB — GLUCOSE, CAPILLARY
GLUCOSE-CAPILLARY: 177 mg/dL — AB (ref 65–99)
GLUCOSE-CAPILLARY: 205 mg/dL — AB (ref 65–99)
GLUCOSE-CAPILLARY: 254 mg/dL — AB (ref 65–99)
Glucose-Capillary: 175 mg/dL — ABNORMAL HIGH (ref 65–99)

## 2014-11-23 DIAGNOSIS — E119 Type 2 diabetes mellitus without complications: Secondary | ICD-10-CM | POA: Diagnosis not present

## 2014-11-23 LAB — GLUCOSE, CAPILLARY
GLUCOSE-CAPILLARY: 169 mg/dL — AB (ref 65–99)
GLUCOSE-CAPILLARY: 159 mg/dL — AB (ref 65–99)
GLUCOSE-CAPILLARY: 175 mg/dL — AB (ref 65–99)
Glucose-Capillary: 223 mg/dL — ABNORMAL HIGH (ref 65–99)

## 2014-11-24 DIAGNOSIS — E119 Type 2 diabetes mellitus without complications: Secondary | ICD-10-CM | POA: Diagnosis not present

## 2014-11-24 LAB — GLUCOSE, CAPILLARY
Glucose-Capillary: 157 mg/dL — ABNORMAL HIGH (ref 65–99)
Glucose-Capillary: 140 mg/dL — ABNORMAL HIGH (ref 65–99)
Glucose-Capillary: 205 mg/dL — ABNORMAL HIGH (ref 65–99)

## 2014-11-25 LAB — GLUCOSE, CAPILLARY
GLUCOSE-CAPILLARY: 123 mg/dL — AB (ref 65–99)
GLUCOSE-CAPILLARY: 179 mg/dL — AB (ref 65–99)
Glucose-Capillary: 230 mg/dL — ABNORMAL HIGH (ref 65–99)
Glucose-Capillary: 15 mg/dL — CL (ref 65–99)
Glucose-Capillary: 154 mg/dL — ABNORMAL HIGH (ref 65–99)

## 2014-11-26 DIAGNOSIS — E119 Type 2 diabetes mellitus without complications: Secondary | ICD-10-CM | POA: Diagnosis not present

## 2014-11-26 LAB — GLUCOSE, CAPILLARY
GLUCOSE-CAPILLARY: 193 mg/dL — AB (ref 65–99)
GLUCOSE-CAPILLARY: 189 mg/dL — AB (ref 65–99)
Glucose-Capillary: 106 mg/dL — ABNORMAL HIGH (ref 65–99)

## 2014-11-27 DIAGNOSIS — E119 Type 2 diabetes mellitus without complications: Secondary | ICD-10-CM | POA: Diagnosis not present

## 2014-11-27 LAB — GLUCOSE, CAPILLARY
GLUCOSE-CAPILLARY: 153 mg/dL — AB (ref 65–99)
GLUCOSE-CAPILLARY: 131 mg/dL — AB (ref 65–99)
Glucose-Capillary: 137 mg/dL — ABNORMAL HIGH (ref 65–99)
Glucose-Capillary: 131 mg/dL — ABNORMAL HIGH (ref 65–99)

## 2014-11-28 DIAGNOSIS — E119 Type 2 diabetes mellitus without complications: Secondary | ICD-10-CM | POA: Diagnosis not present

## 2014-11-28 LAB — GLUCOSE, CAPILLARY
GLUCOSE-CAPILLARY: 129 mg/dL — AB (ref 65–99)
Glucose-Capillary: 117 mg/dL — ABNORMAL HIGH (ref 65–99)
Glucose-Capillary: 150 mg/dL — ABNORMAL HIGH (ref 65–99)
Glucose-Capillary: 221 mg/dL — ABNORMAL HIGH (ref 65–99)

## 2014-11-29 DIAGNOSIS — E119 Type 2 diabetes mellitus without complications: Secondary | ICD-10-CM | POA: Diagnosis not present

## 2014-11-29 LAB — GLUCOSE, CAPILLARY
GLUCOSE-CAPILLARY: 143 mg/dL — AB (ref 65–99)
GLUCOSE-CAPILLARY: 169 mg/dL — AB (ref 65–99)
Glucose-Capillary: 132 mg/dL — ABNORMAL HIGH (ref 65–99)
Glucose-Capillary: 204 mg/dL — ABNORMAL HIGH (ref 65–99)

## 2014-11-30 DIAGNOSIS — E119 Type 2 diabetes mellitus without complications: Secondary | ICD-10-CM | POA: Diagnosis not present

## 2014-11-30 LAB — GLUCOSE, CAPILLARY
GLUCOSE-CAPILLARY: 104 mg/dL — AB (ref 65–99)
Glucose-Capillary: 173 mg/dL — ABNORMAL HIGH (ref 65–99)
Glucose-Capillary: 188 mg/dL — ABNORMAL HIGH (ref 65–99)

## 2014-12-01 DIAGNOSIS — E119 Type 2 diabetes mellitus without complications: Secondary | ICD-10-CM | POA: Diagnosis not present

## 2014-12-01 LAB — GLUCOSE, CAPILLARY
GLUCOSE-CAPILLARY: 132 mg/dL — AB (ref 65–99)
GLUCOSE-CAPILLARY: 151 mg/dL — AB (ref 65–99)
GLUCOSE-CAPILLARY: 209 mg/dL — AB (ref 65–99)
Glucose-Capillary: 197 mg/dL — ABNORMAL HIGH (ref 65–99)
Glucose-Capillary: 207 mg/dL — ABNORMAL HIGH (ref 65–99)

## 2014-12-02 DIAGNOSIS — E119 Type 2 diabetes mellitus without complications: Secondary | ICD-10-CM | POA: Diagnosis not present

## 2014-12-02 LAB — GLUCOSE, CAPILLARY
GLUCOSE-CAPILLARY: 259 mg/dL — AB (ref 65–99)
GLUCOSE-CAPILLARY: 194 mg/dL — AB (ref 65–99)
Glucose-Capillary: 163 mg/dL — ABNORMAL HIGH (ref 65–99)
Glucose-Capillary: 171 mg/dL — ABNORMAL HIGH (ref 65–99)

## 2014-12-03 DIAGNOSIS — E119 Type 2 diabetes mellitus without complications: Secondary | ICD-10-CM | POA: Diagnosis not present

## 2014-12-03 LAB — GLUCOSE, CAPILLARY
GLUCOSE-CAPILLARY: 172 mg/dL — AB (ref 65–99)
Glucose-Capillary: 160 mg/dL — ABNORMAL HIGH (ref 65–99)
Glucose-Capillary: 170 mg/dL — ABNORMAL HIGH (ref 65–99)
Glucose-Capillary: 198 mg/dL — ABNORMAL HIGH (ref 65–99)

## 2014-12-04 DIAGNOSIS — E119 Type 2 diabetes mellitus without complications: Secondary | ICD-10-CM | POA: Diagnosis not present

## 2014-12-04 LAB — GLUCOSE, CAPILLARY
GLUCOSE-CAPILLARY: 270 mg/dL — AB (ref 65–99)
Glucose-Capillary: 197 mg/dL — ABNORMAL HIGH (ref 65–99)

## 2014-12-05 DIAGNOSIS — E119 Type 2 diabetes mellitus without complications: Secondary | ICD-10-CM | POA: Diagnosis not present

## 2014-12-05 LAB — GLUCOSE, CAPILLARY
GLUCOSE-CAPILLARY: 177 mg/dL — AB (ref 65–99)
GLUCOSE-CAPILLARY: 221 mg/dL — AB (ref 65–99)
GLUCOSE-CAPILLARY: 249 mg/dL — AB (ref 65–99)
Glucose-Capillary: 188 mg/dL — ABNORMAL HIGH (ref 65–99)

## 2014-12-06 ENCOUNTER — Emergency Department: Payer: Medicare Other

## 2014-12-06 ENCOUNTER — Encounter: Payer: Self-pay | Admitting: Emergency Medicine

## 2014-12-06 ENCOUNTER — Inpatient Hospital Stay
Admission: EM | Admit: 2014-12-06 | Discharge: 2014-12-13 | DRG: 871 | Disposition: A | Discharge status: Skilled Nursing Facility | Payer: Medicare Other | Attending: Internal Medicine | Admitting: Internal Medicine

## 2014-12-06 DIAGNOSIS — G4730 Sleep apnea, unspecified: Secondary | ICD-10-CM | POA: Diagnosis present

## 2014-12-06 DIAGNOSIS — I080 Rheumatic disorders of both mitral and aortic valves: Secondary | ICD-10-CM | POA: Diagnosis present

## 2014-12-06 DIAGNOSIS — G9341 Metabolic encephalopathy: Secondary | ICD-10-CM | POA: Diagnosis not present

## 2014-12-06 DIAGNOSIS — R109 Unspecified abdominal pain: Secondary | ICD-10-CM

## 2014-12-06 DIAGNOSIS — Z515 Encounter for palliative care: Secondary | ICD-10-CM | POA: Diagnosis not present

## 2014-12-06 DIAGNOSIS — I472 Ventricular tachycardia: Secondary | ICD-10-CM | POA: Diagnosis present

## 2014-12-06 DIAGNOSIS — R0600 Dyspnea, unspecified: Secondary | ICD-10-CM | POA: Diagnosis present

## 2014-12-06 DIAGNOSIS — D649 Anemia, unspecified: Secondary | ICD-10-CM | POA: Diagnosis present

## 2014-12-06 DIAGNOSIS — E875 Hyperkalemia: Secondary | ICD-10-CM | POA: Diagnosis present

## 2014-12-06 DIAGNOSIS — J9622 Acute and chronic respiratory failure with hypercapnia: Secondary | ICD-10-CM | POA: Diagnosis present

## 2014-12-06 DIAGNOSIS — Z886 Allergy status to analgesic agent status: Secondary | ICD-10-CM

## 2014-12-06 DIAGNOSIS — A419 Sepsis, unspecified organism: Secondary | ICD-10-CM | POA: Diagnosis present

## 2014-12-06 DIAGNOSIS — J690 Pneumonitis due to inhalation of food and vomit: Secondary | ICD-10-CM | POA: Diagnosis present

## 2014-12-06 DIAGNOSIS — N390 Urinary tract infection, site not specified: Secondary | ICD-10-CM

## 2014-12-06 DIAGNOSIS — I482 Chronic atrial fibrillation: Secondary | ICD-10-CM | POA: Diagnosis present

## 2014-12-06 DIAGNOSIS — E785 Hyperlipidemia, unspecified: Secondary | ICD-10-CM | POA: Diagnosis present

## 2014-12-06 DIAGNOSIS — J9621 Acute and chronic respiratory failure with hypoxia: Secondary | ICD-10-CM | POA: Diagnosis present

## 2014-12-06 DIAGNOSIS — J449 Chronic obstructive pulmonary disease, unspecified: Secondary | ICD-10-CM | POA: Diagnosis present

## 2014-12-06 DIAGNOSIS — R0902 Hypoxemia: Secondary | ICD-10-CM | POA: Insufficient documentation

## 2014-12-06 DIAGNOSIS — I493 Ventricular premature depolarization: Secondary | ICD-10-CM | POA: Diagnosis present

## 2014-12-06 DIAGNOSIS — I110 Hypertensive heart disease with heart failure: Secondary | ICD-10-CM | POA: Diagnosis present

## 2014-12-06 DIAGNOSIS — I5032 Chronic diastolic (congestive) heart failure: Secondary | ICD-10-CM | POA: Diagnosis present

## 2014-12-06 DIAGNOSIS — E119 Type 2 diabetes mellitus without complications: Secondary | ICD-10-CM | POA: Diagnosis present

## 2014-12-06 DIAGNOSIS — L89152 Pressure ulcer of sacral region, stage 2: Secondary | ICD-10-CM | POA: Diagnosis present

## 2014-12-06 DIAGNOSIS — F419 Anxiety disorder, unspecified: Secondary | ICD-10-CM | POA: Diagnosis present

## 2014-12-06 DIAGNOSIS — Z9049 Acquired absence of other specified parts of digestive tract: Secondary | ICD-10-CM

## 2014-12-06 DIAGNOSIS — E876 Hypokalemia: Secondary | ICD-10-CM | POA: Diagnosis present

## 2014-12-06 DIAGNOSIS — R410 Disorientation, unspecified: Secondary | ICD-10-CM | POA: Diagnosis not present

## 2014-12-06 DIAGNOSIS — G2581 Restless legs syndrome: Secondary | ICD-10-CM | POA: Diagnosis present

## 2014-12-06 DIAGNOSIS — R0603 Acute respiratory distress: Secondary | ICD-10-CM

## 2014-12-06 DIAGNOSIS — F0390 Unspecified dementia without behavioral disturbance: Secondary | ICD-10-CM | POA: Diagnosis present

## 2014-12-06 DIAGNOSIS — I248 Other forms of acute ischemic heart disease: Secondary | ICD-10-CM | POA: Diagnosis present

## 2014-12-06 DIAGNOSIS — R29898 Other symptoms and signs involving the musculoskeletal system: Secondary | ICD-10-CM | POA: Diagnosis not present

## 2014-12-06 DIAGNOSIS — Z794 Long term (current) use of insulin: Secondary | ICD-10-CM | POA: Diagnosis not present

## 2014-12-06 LAB — CBC WITH DIFFERENTIAL/PLATELET
Basophils Absolute: 0.1 10*3/uL (ref 0–0.1)
Basophils Relative: 1 %
Eosinophils Absolute: 0 10*3/uL (ref 0–0.7)
Eosinophils Relative: 0 %
HEMATOCRIT: 35.1 % (ref 35.0–47.0)
HEMOGLOBIN: 10.8 g/dL — AB (ref 12.0–16.0)
LYMPHS ABS: 0.7 10*3/uL — AB (ref 1.0–3.6)
Lymphocytes Relative: 6 %
MCH: 29.3 pg (ref 26.0–34.0)
MCHC: 30.8 g/dL — AB (ref 32.0–36.0)
MCV: 95.1 fL (ref 80.0–100.0)
MONO ABS: 0.5 10*3/uL (ref 0.2–0.9)
MONOS PCT: 5 %
NEUTROS ABS: 10 10*3/uL — AB (ref 1.4–6.5)
NEUTROS PCT: 88 %
Platelets: 153 10*3/uL (ref 150–440)
RBC: 3.7 MIL/uL — ABNORMAL LOW (ref 3.80–5.20)
RDW: 17.2 % — ABNORMAL HIGH (ref 11.5–14.5)
WBC: 11.3 10*3/uL — ABNORMAL HIGH (ref 3.6–11.0)

## 2014-12-06 LAB — COMPREHENSIVE METABOLIC PANEL
ALBUMIN: 3.9 g/dL (ref 3.5–5.0)
ALK PHOS: 59 U/L (ref 38–126)
ALT: 13 U/L — AB (ref 14–54)
AST: 18 U/L (ref 15–41)
Anion gap: 3 — ABNORMAL LOW (ref 5–15)
BILIRUBIN TOTAL: 0.7 mg/dL (ref 0.3–1.2)
BUN: 29 mg/dL — AB (ref 6–20)
CO2: 44 mmol/L — AB (ref 22–32)
Calcium: 10.7 mg/dL — ABNORMAL HIGH (ref 8.9–10.3)
Chloride: 92 mmol/L — ABNORMAL LOW (ref 101–111)
Creatinine, Ser: 0.94 mg/dL (ref 0.44–1.00)
GFR calc Af Amer: >60 mL/min (ref >60)
GFR calc non Af Amer: 56 mL/min — ABNORMAL LOW (ref >60)
GLUCOSE: 287 mg/dL — AB (ref 65–99)
Potassium: 6 mmol/L — ABNORMAL HIGH (ref 3.5–5.1)
SODIUM: 139 mmol/L (ref 135–145)
Total Protein: 7.7 g/dL (ref 6.5–8.1)

## 2014-12-06 LAB — URINALYSIS COMPLETE WITH MICROSCOPIC (ARMC ONLY)
Bilirubin Urine: NEGATIVE
Glucose, UA: NEGATIVE mg/dL
HGB URINE DIPSTICK: NEGATIVE
Ketones, ur: NEGATIVE mg/dL
Nitrite: POSITIVE — AB
PH: 5 (ref 5.0–8.0)
PROTEIN: 100 mg/dL — AB
SPECIFIC GRAVITY, URINE: 1.017 (ref 1.005–1.030)
SQUAMOUS EPITHELIAL / LPF: NONE SEEN

## 2014-12-06 LAB — LACTIC ACID, PLASMA
Lactic Acid, Venous: 1.1 mmol/L (ref 0.5–2.0)
Lactic Acid, Venous: 1.4 mmol/L (ref 0.5–2.0)

## 2014-12-06 LAB — POTASSIUM: Potassium: 5.2 mmol/L — ABNORMAL HIGH (ref 3.5–5.1)

## 2014-12-06 LAB — TROPONIN I: TROPONIN I: 0.04 ng/mL — AB (ref <0.031)

## 2014-12-06 LAB — GLUCOSE, CAPILLARY
Glucose-Capillary: 287 mg/dL — ABNORMAL HIGH (ref 65–99)
Glucose-Capillary: 201 mg/dL — ABNORMAL HIGH (ref 65–99)

## 2014-12-06 LAB — MRSA PCR SCREENING: MRSA by PCR: NEGATIVE

## 2014-12-06 MED ORDER — SENNA 8.6 MG PO TABS: 1.0000 | ORAL_TABLET | Freq: Every evening | ORAL | Status: DC | PRN

## 2014-12-06 MED ORDER — SODIUM POLYSTYRENE SULFONATE 15 GM/60ML PO SUSP
30.0000 g | Freq: Once | ORAL | Status: AC
  Administered 2014-12-06: 30 g via RECTAL
  Filled 2014-12-06: qty 120

## 2014-12-06 MED ORDER — SODIUM CHLORIDE 0.9 % IV BOLUS (SEPSIS)
1000.0000 mL | INTRAVENOUS | Status: AC
  Administered 2014-12-06: 1000 mL via INTRAVENOUS

## 2014-12-06 MED ORDER — VANCOMYCIN HCL IN DEXTROSE 1-5 GM/200ML-% IV SOLN
1000.0000 mg | INTRAVENOUS | Status: DC
  Administered 2014-12-07: 1000 mg via INTRAVENOUS
  Filled 2014-12-06 (×2): qty 200

## 2014-12-06 MED ORDER — CHLORHEXIDINE GLUCONATE 0.12 % MT SOLN
15.0000 mL | Freq: Two times a day (BID) | OROMUCOSAL | Status: DC
  Administered 2014-12-06 – 2014-12-13 (×13): 15 mL via OROMUCOSAL
  Filled 2014-12-06 (×10): qty 15

## 2014-12-06 MED ORDER — PIPERACILLIN-TAZOBACTAM 3.375 G IVPB
3.3750 g | Freq: Three times a day (TID) | INTRAVENOUS | Status: DC
  Administered 2014-12-06 – 2014-12-08 (×6): 3.375 g via INTRAVENOUS
  Filled 2014-12-06 (×8): qty 50

## 2014-12-06 MED ORDER — CETYLPYRIDINIUM CHLORIDE 0.05 % MT LIQD
7.0000 mL | Freq: Two times a day (BID) | OROMUCOSAL | Status: DC
  Administered 2014-12-06 – 2014-12-13 (×12): 7 mL via OROMUCOSAL

## 2014-12-06 MED ORDER — VANCOMYCIN HCL 10 G IV SOLR
1250.0000 mg | INTRAVENOUS | Status: DC
  Administered 2014-12-06 – 2014-12-07 (×2): 1250 mg via INTRAVENOUS
  Filled 2014-12-06 (×3): qty 1250

## 2014-12-06 MED ORDER — SODIUM CHLORIDE 0.9 % IV SOLN
INTRAVENOUS | Status: DC
  Administered 2014-12-06 (×2): via INTRAVENOUS

## 2014-12-06 MED ORDER — INSULIN ASPART 100 UNIT/ML ~~LOC~~ SOLN
0.0000 [IU] | Freq: Three times a day (TID) | SUBCUTANEOUS | Status: DC
  Administered 2014-12-06: 3 [IU] via SUBCUTANEOUS
  Administered 2014-12-07 – 2014-12-08 (×4): 2 [IU] via SUBCUTANEOUS
  Administered 2014-12-08: 3 [IU] via SUBCUTANEOUS
  Administered 2014-12-09: 2 [IU] via SUBCUTANEOUS
  Administered 2014-12-09 – 2014-12-10 (×2): 1 [IU] via SUBCUTANEOUS
  Administered 2014-12-10: 2 [IU] via SUBCUTANEOUS
  Administered 2014-12-10: 1 [IU] via SUBCUTANEOUS
  Administered 2014-12-11 (×2): 2 [IU] via SUBCUTANEOUS
  Administered 2014-12-12: 18:00:00 1 [IU] via SUBCUTANEOUS
  Administered 2014-12-12: 5 [IU] via SUBCUTANEOUS
  Administered 2014-12-13: 2 [IU] via SUBCUTANEOUS
  Administered 2014-12-13: 12:00:00 3 [IU] via SUBCUTANEOUS
  Filled 2014-12-06: qty 3
  Filled 2014-12-06 (×5): qty 2
  Filled 2014-12-06: qty 1
  Filled 2014-12-06: qty 2
  Filled 2014-12-06: qty 3
  Filled 2014-12-06: qty 2
  Filled 2014-12-06: qty 3
  Filled 2014-12-06: qty 2
  Filled 2014-12-06 (×2): qty 1
  Filled 2014-12-06: qty 2
  Filled 2014-12-06: qty 5
  Filled 2014-12-06 (×2): qty 2
  Filled 2014-12-06: qty 1

## 2014-12-06 MED ORDER — ENOXAPARIN SODIUM 40 MG/0.4ML ~~LOC~~ SOLN
40.0000 mg | SUBCUTANEOUS | Status: DC
  Administered 2014-12-06 – 2014-12-13 (×8): 40 mg via SUBCUTANEOUS
  Filled 2014-12-06 (×9): qty 0.4

## 2014-12-06 MED ORDER — IPRATROPIUM-ALBUTEROL 0.5-2.5 (3) MG/3ML IN SOLN
3.0000 mL | Freq: Four times a day (QID) | RESPIRATORY_TRACT | Status: DC
  Administered 2014-12-06 – 2014-12-07 (×3): 3 mL via RESPIRATORY_TRACT
  Filled 2014-12-06 (×3): qty 3

## 2014-12-06 MED ORDER — SODIUM CHLORIDE 0.9 % IV BOLUS (SEPSIS)
500.0000 mL | INTRAVENOUS | Status: AC
  Administered 2014-12-06: 500 mL via INTRAVENOUS

## 2014-12-06 MED ORDER — VANCOMYCIN HCL IN DEXTROSE 1-5 GM/200ML-% IV SOLN
1000.0000 mg | Freq: Once | INTRAVENOUS | Status: AC
  Administered 2014-12-06: 1000 mg via INTRAVENOUS
  Filled 2014-12-06: qty 200

## 2014-12-06 MED ORDER — DOCUSATE SODIUM 100 MG PO CAPS
100.0000 mg | ORAL_CAPSULE | Freq: Two times a day (BID) | ORAL | Status: DC
  Administered 2014-12-08 – 2014-12-13 (×9): 100 mg via ORAL
  Filled 2014-12-06 (×11): qty 1

## 2014-12-06 MED ORDER — PIPERACILLIN-TAZOBACTAM 3.375 G IVPB 30 MIN
3.3750 g | Freq: Once | INTRAVENOUS | Status: AC
  Administered 2014-12-06: 3.375 g via INTRAVENOUS
  Filled 2014-12-06: qty 50

## 2014-12-06 MED ORDER — DIGOXIN 0.25 MG/ML IJ SOLN
0.1250 mg | Freq: Every day | INTRAMUSCULAR | Status: DC
  Administered 2014-12-06: 0.125 mg via INTRAVENOUS
  Filled 2014-12-06: qty 2

## 2014-12-06 NOTE — ED Notes (Signed)
Troponin 0.04 verbal readback with Derrill KayGoodman

## 2014-12-06 NOTE — Progress Notes (Signed)
ANTICOAGULATION CONSULT NOTE - Initial Consult  Pharmacy Consult for Lovenox  Indication: DVT prophylaxis  Allergies  Allergen Reactions  . Hydrocodone-Acetaminophen Nausea Only    Patient Measurements: Height:  (160 cm) Weight: 180 lb 11.2 oz (81.965 kg) IBW/kg (Calculated) : 52.4 Heparin Dosing Weight:   Vital Signs: Temp: 101 F (38.3 C) (11/26 0654) Temp Source: Oral (11/26 0654) BP: 131/87 mmHg (11/26 1000) Pulse Rate: 86 (11/26 1000)  Labs:  Recent Labs  12/06/14 0702  HGB 10.8*  HCT 35.1  PLT 153  CREATININE 0.94  TROPONINI 0.04*    Estimated Creatinine Clearance: 49.2 mL/min (by C-G formula based on Cr of 0.94).   Medical History: Past Medical History  Diagnosis Date  . Asthma   . A-fib (HCC)   . Arthritis   . Aortic stenosis   . Aortic insufficiency   . Hyperlipidemia   . Hypertension   . Sleep apnea   . CHF (congestive heart failure) (HCC)   . History of DVT (deep vein thrombosis)   . Diabetes mellitus type 2, uncontrolled (HCC)   . COPD (chronic obstructive pulmonary disease) (HCC)   . Cervical spondylolysis   . DDD (degenerative disc disease), cervical   . Anemia   . Mitral valve disorder   . Anxiety   . Chronic gout   . Restless leg syndrome   . Osteoarthritis     Medications:  Prescriptions prior to admission  Medication Sig Dispense Refill Last Dose  . acetaminophen (TYLENOL) 325 MG tablet Take 650 mg by mouth See admin instructions. Take 2 tablets ( ) orally four times a day. (scheduled) Take 2 tablets ( ) orally every 4 hours as needed for pain and/or increased temperature. (prn)   unknown  . acidophilus (RISAQUAD) CAPS capsule Take 1 capsule by mouth 2 (two) times daily.   unknown  . allopurinol (ZYLOPRIM) 100 MG tablet Take 100 mg by mouth daily.   unknown  . ALPRAZolam (XANAX) 0.25 MG tablet Take 1 tablet (0.25 mg total) by mouth every evening. (Patient taking differently: Take 0.25 mg by mouth See admin  instructions. Take 1 tablet orally every evening for anxiety. (scheduled) and  Take 1 tablet orally every 6 hours as needed for anxiety. (prn)) 30 tablet 0 unknown  . azelastine (ASTELIN) 0.1 % nasal spray Place 1 spray into both nostrils 2 (two) times daily.   unknown  . bisacodyl (DULCOLAX) 10 MG suppository Place 10 mg rectally daily as needed for mild constipation, moderate constipation or severe constipation.   unknown  . carvedilol (COREG) 3.125 MG tablet Take 3.125 mg by mouth 2 (two) times daily.   unknown  . clotrimazole (LOTRIMIN) 1 % cream Apply 1 application topically 2 (two) times daily. Pt mixes with zinc oxide cream.   unknown  . Cranberry 250 MG TABS Take 500 mg by mouth daily.   unknown  . cyclobenzaprine (FLEXERIL) 5 MG tablet Take 5 mg by mouth 3 (three) times daily as needed for muscle spasms.   unknown  . digoxin (LANOXIN) 0.125 MG tablet Take 0.0625 mg by mouth daily.   unknown  . ferrous sulfate 325 (65 FE) MG tablet Take 325 mg by mouth daily.   unknown  . Fluticasone-Salmeterol (ADVAIR) 250-50 MCG/DOSE AEPB Inhale 1 puff into the lungs every 12 (twelve) hours.    unknown  . furosemide (LASIX) 20 MG tablet Take 20 mg by mouth daily.   unknown  . gabapentin (NEURONTIN) 300 MG capsule Take 1 capsule (300 mg total) by  mouth at bedtime. (Patient taking differently: Take 300-600 mg by mouth See admin instructions. Take 2 capsules ( ) orally 3 times a day and take 1 capsule orally once a day at bedtime.) 30 capsule 0 unknown  . glimepiride (AMARYL) 1 MG tablet Take 1 mg by mouth 2 (two) times daily.   unknown  . insulin regular (NOVOLIN R,HUMULIN R) 100 units/mL injection Inject into the skin daily. Per sliding scale   unknown at unknown  . ipratropium-albuterol (DUONEB) 0.5-2.5 (3) MG/3ML SOLN Take 3 mLs by nebulization every 4 (four) hours as needed. (Patient taking differently: Take 3 mLs by nebulization every 4 (four) hours as needed (for shortness of breath.). ) 360 mL 1  unknown  . magnesium hydroxide (MILK OF MAGNESIA) 400 MG/5ML suspension Take 30 mLs by mouth daily as needed for mild constipation or moderate constipation.   unknown  . meclizine (ANTIVERT) 12.5 MG tablet Take 12.5 mg by mouth 3 (three) times daily as needed for dizziness.   unknown  . metolazone (ZAROXOLYN) 5 MG tablet Take 5 mg by mouth 3 (three) times a week. Pt takes on Monday, Thursday, and Sunday.   unknown  . mirabegron ER (MYRBETRIQ) 50 MG TB24 tablet Take 1 tablet (50 mg total) by mouth daily. 30 tablet 2 unknown  . mometasone (NASONEX) 50 MCG/ACT nasal spray Place 1 spray into the nose daily.    unknown  . montelukast (SINGULAIR) 10 MG tablet Take 10 mg by mouth at bedtime.    unknown  . nitrofurantoin (MACRODANTIN) 50 MG capsule Take 1 capsule (50 mg total) by mouth at bedtime. 90 capsule 0 unknown  . ondansetron (ZOFRAN) 4 MG tablet Take 4 mg by mouth every 6 (six) hours as needed for nausea or vomiting.   unknown  . polyethylene glycol (MIRALAX / GLYCOLAX) packet Take 17 g by mouth daily. 14 each 0 unknown  . potassium chloride SA (K-DUR,KLOR-CON) 20 MEQ tablet Take 20 mEq by mouth 2 (two) times daily.   unknown  . pravastatin (PRAVACHOL) 40 MG tablet Take 40 mg by mouth at bedtime.   unknown  . promethazine (PHENERGAN) 25 MG/ML injection Inject 25 mg into the vein every 4 (four) hours as needed for nausea or vomiting.   unknown  . rOPINIRole (REQUIP) 0.25 MG tablet Take 0.25 mg by mouth at bedtime.   unknown  . sennosides-docusate sodium (SENOKOT-S) 8.6-50 MG tablet Take 1 tablet by mouth 2 (two) times daily.   unknown  . sitaGLIPtin (JANUVIA) 100 MG tablet Take 100 mg by mouth daily.   unknown  . traMADol (ULTRAM) 50 MG tablet Take 1 tablet (50 mg total) by mouth every 6 (six) hours as needed for moderate pain. 30 tablet 0 unknown  . traZODone (DESYREL) 50 MG tablet Take 50 mg by mouth at bedtime as needed for sleep (for insomnia).   unknown  . Trolamine Salicylate (ASPERCREME) 10 %  LOTN Apply 1 application topically 4 (four) times daily. Apply a thin film to both ankles/top of feet and any other painful areas.   unknown  . ZINC OXIDE, TOPICAL, 10 % CREA Apply 1 application topically 2 (two) times daily. Pt mixes with Clotrimazole cream.   unknown  . cefUROXime (CEFTIN) 250 MG tablet Take 1 tablet (250 mg total) by mouth 2 (two) times daily with a meal. 8 tablet 0   . digoxin 62.5 MCG TABS Take 0.0625 mg by mouth daily. 30 tablet 0   . docusate sodium (COLACE) 100 MG capsule Take 1  capsule (100 mg total) by mouth 2 (two) times daily. 10 capsule 0 unknown at unknown  . furosemide (LASIX) 40 MG tablet Take 0.5 tablets (20 mg total) by mouth daily. 30 tablet 0   . predniSONE (DELTASONE) 10 MG tablet Take 6 tabs first day, 5 tab on day 2, then 4 on day 3rd, 3 tabs on day 4th , 2 tab on day 5th, and 1 tab on 6th day. 21 tablet 0   . senna (SENOKOT) 8.6 MG TABS tablet Take 1 tablet (8.6 mg total) by mouth at bedtime as needed for mild constipation. 120 each 0 PRN at PRN    Assessment: CrCl = 49.2 ml/min  Goal of Therapy:  DVT prophylaxis   Plan:  Lovenox 30 mg SQ Q24H originally ordered.  Will adjust dose to lovenox 40 mg SQ Q24H based on CrCl > 30 ml/min.   Kariann Wecker D 12/06/2014,11:31 AM

## 2014-12-06 NOTE — ED Notes (Addendum)
[facility] rm [facility].  Per EMS, [facility] staff found pt this morning in resp distress, 76% on 2L.  Pt normally on 2L o2.  Pt hx COPD. Pt put on NRB and sat 90% by EMS, EMS gave 2 duonebs w/ difficulty.  Pt denies pain.  Pt NAD at this time, respirations equal but labored, skin warm and moist.

## 2014-12-06 NOTE — H&P (Addendum)
[facility] [place] at [facility]   PATIENT NAME: Mindy Cook    MR#:  [id number]  DATE OF BIRTH:  04-Apr-1933  DATE OF ADMISSION:  12/06/2014  PRIMARY CARE PHYSICIAN: Patrice Paradise, MD   REQUESTING/REFERRING PHYSICIAN: Dr. Derrill Kay  CHIEF COMPLAINT: Lethargy, hypoxia    Chief Complaint  Patient presents with  . Respiratory Distress    HISTORY OF PRESENT ILLNESS:  Mindy Cook  is a 81 y.o. female with a known history of atrial fibrillation, asthma, hypertension, hyperlipidemia, history of DVT, bedridden status with ambulatory difficulties comes in from of [facility] secondary to lethargy and hypoxia. Oxygen saturation 76% on 2 L of oxygen. Patient is on chronically 2 L of oxygen. Last night she became hypoxic sats were in low 70s and the staff found her. Because of that she was brought in here. Right now she is 100% nonrebreather saturations are 100%. Lethargic and unable to answer questions. She had fever 101 Fahrenheit in the emergency room.  PAST MEDICAL HISTORY:   Past Medical History  Diagnosis Date  . Asthma   . A-fib (HCC)   . Arthritis   . Aortic stenosis   . Aortic insufficiency   . Hyperlipidemia   . Hypertension   . Sleep apnea   . CHF (congestive heart failure) (HCC)   . History of DVT (deep vein thrombosis)   . Diabetes mellitus type 2, uncontrolled (HCC)   . COPD (chronic obstructive pulmonary disease) (HCC)   . Cervical spondylolysis   . DDD (degenerative disc disease), cervical   . Anemia   . Mitral valve disorder   . Anxiety   . Chronic gout   . Restless leg syndrome   . Osteoarthritis     PAST SURGICAL HISTOIRY:   Past Surgical History  Procedure Laterality Date  . Abdominal hysterectomy    . Cardiac catheterization    . Colonoscopy      SOCIAL HISTORY:   Social History  Substance Use Topics  . Smoking status: Never Smoker   . Smokeless tobacco: Not on file  . Alcohol Use: No    FAMILY HISTORY:    Family History  Problem Relation Age of Onset  . Heart failure Mother   . Cancer Father   . Heart disease Father   . Breast cancer Sister   . Fibromyalgia Sister     DRUG ALLERGIES:   Allergies  Allergen Reactions  . Hydrocodone-Acetaminophen Nausea Only  History no smoking or drinking  REVIEW OF SYSTEMS: unobtainable due to lethargy.     MEDICATIONS AT HOME:   Prior to Admission medications   Medication Sig Start Date End Date Taking? Authorizing Provider  allopurinol (ZYLOPRIM) 100 MG tablet Take 100 mg by mouth daily.    Historical Provider, MD  ALPRAZolam Prudy Feeler) 0.25 MG tablet Take 1 tablet (0.25 mg total) by mouth every evening. 09/01/14   Katharina Caper, MD  azelastine (ASTELIN) 0.1 % nasal spray Place 1 spray into both nostrils 2 (two) times daily.    Historical Provider, MD  carvedilol (COREG) 3.125 MG tablet Take 3.125 mg by mouth 2 (two) times daily.    Historical Provider, MD  cefUROXime (CEFTIN) 250 MG tablet Take 1 tablet (250 mg total) by mouth 2 (two) times daily with a meal. 09/18/14   Altamese Dilling, MD  clotrimazole (LOTRIMIN) 1 % cream Apply 1 application topically 2 (two) times daily. Pt mixes with zinc oxide cream.    Historical Provider, MD  Cranberry 250 MG  TABS Take 500 mg by mouth daily.    Historical Provider, MD  cyclobenzaprine (FLEXERIL) 5 MG tablet Take 5 mg by mouth 3 (three) times daily as needed for muscle spasms.    Historical Provider, MD  diclofenac sodium (VOLTAREN) 1 % GEL Apply 2 g topically 2 (two) times daily.    Historical Provider, MD  digoxin 62.5 MCG TABS Take 0.0625 mg by mouth daily. 09/18/14   Altamese Dilling, MD  docusate sodium (COLACE) 100 MG capsule Take 1 capsule (100 mg total) by mouth 2 (two) times daily. 08/31/14   Katharina Caper, MD  ferrous sulfate 325 (65 FE) MG tablet Take 325 mg by mouth daily.    Historical Provider, MD  fluticasone (FLONASE) 50 MCG/ACT nasal spray Place 2 sprays into both nostrils daily as  needed for allergies or rhinitis.    Historical Provider, MD  Fluticasone-Salmeterol (ADVAIR) 250-50 MCG/DOSE AEPB Inhale 1 puff into the lungs 2 (two) times daily.    Historical Provider, MD  furosemide (LASIX) 40 MG tablet Take 0.5 tablets (20 mg total) by mouth daily. 09/18/14   Altamese Dilling, MD  gabapentin (NEURONTIN) 300 MG capsule Take 1 capsule (300 mg total) by mouth at bedtime. 09/18/14   Altamese Dilling, MD  glimepiride (AMARYL) 1 MG tablet Take 1 mg by mouth 2 (two) times daily.    Historical Provider, MD  insulin regular (NOVOLIN R,HUMULIN R) 100 units/mL injection Inject into the skin daily. Per sliding scale    Historical Provider, MD  ipratropium-albuterol (DUONEB) 0.5-2.5 (3) MG/3ML SOLN Take 3 mLs by nebulization every 4 (four) hours as needed. 08/31/14   Katharina Caper, MD  lactulose (CHRONULAC) 10 GM/15ML solution Take 10 g by mouth 2 (two) times daily as needed for mild constipation.    Historical Provider, MD  meclizine (ANTIVERT) 12.5 MG tablet Take 12.5 mg by mouth 3 (three) times daily as needed for dizziness.    Historical Provider, MD  metolazone (ZAROXOLYN) 5 MG tablet Take 5 mg by mouth 3 (three) times a week. Pt takes on Monday, Thursday, and Sunday.    Historical Provider, MD  mirabegron ER (MYRBETRIQ) 50 MG TB24 tablet Take 1 tablet (50 mg total) by mouth daily. 07/22/14   Crist Fat, MD  mometasone (NASONEX) 50 MCG/ACT nasal spray Place 1 spray into the nose daily.     Historical Provider, MD  montelukast (SINGULAIR) 10 MG tablet Take 10 mg by mouth daily.    Historical Provider, MD  nitrofurantoin (MACRODANTIN) 50 MG capsule Take 1 capsule (50 mg total) by mouth at bedtime. Patient not taking: Reported on 09/16/2014 07/22/14   Crist Fat, MD  polyethylene glycol [facility] Ethelene Hal) packet Take 17 g by mouth daily. 09/01/14   Katharina Caper, MD  potassium chloride SA (K-DUR,KLOR-CON) 20 MEQ tablet Take 20 mEq by mouth 2 (two) times daily.     Historical Provider, MD  pravastatin (PRAVACHOL) 40 MG tablet Take 40 mg by mouth at bedtime.    Historical Provider, MD  predniSONE (DELTASONE) 10 MG tablet Take 6 tabs first day, 5 tab on day 2, then 4 on day 3rd, 3 tabs on day 4th , 2 tab on day 5th, and 1 tab on 6th day. 09/18/14   Altamese Dilling, MD  Probiotic Product (PROBIOTIC DAILY PO) Take 250 mg by mouth 2 (two) times daily.    Historical Provider, MD  promethazine (PHENERGAN) 12.5 MG tablet Take 12.5 mg by mouth every 8 (eight) hours as needed for nausea  or vomiting.    Historical Provider, MD  rOPINIRole (REQUIP) 0.25 MG tablet Take 0.25 mg by mouth at bedtime.    Historical Provider, MD  senna (SENOKOT) 8.6 MG TABS tablet Take 1 tablet (8.6 mg total) by mouth at bedtime as needed for mild constipation. 08/31/14   Katharina Caper, MD  traMADol (ULTRAM) 50 MG tablet Take 1 tablet (50 mg total) by mouth every 6 (six) hours as needed for moderate pain. Patient taking differently: Take 50 mg by mouth every 12 (twelve) hours as needed for moderate pain.  08/31/14   Katharina Caper, MD  ZINC OXIDE, TOPICAL, 10 % CREA Apply 1 application topically 2 (two) times daily. Pt mixes with Clotrimazole cream.    Historical Provider, MD      VITAL SIGNS:  Blood pressure 136/70, pulse 71, temperature 101 F (38.3 C), temperature source Oral, resp. rate 20, height  (1.6 m), weight 81.965 kg (180 lb 11.2 oz), SpO2 100 %.  PHYSICAL EXAMINATION:  GENERAL:  81 y.o.-year-old patient lying in the bed with no acute distress. Lethargic but not using accessory muscles of respiration.  EYES: Pupils equal, round, reactive to light and accommodation. No scleral icterus. Extraocular muscles intact.  HEENT: Head atraumatic, normocephalic. Oropharynx and nasopharynx clear.  NECK:  Supple, no jugular venous distention. No thyroid enlargement, no tenderness.  LUNGS: Normal breath sounds bilaterally, no wheezing, rales,rhonchi or crepitation. No use of accessory  muscles of respiration.  CARDIOVASCULAR: S1, S2 normal. No murmurs, rubs, or gallops.  ABDOMEN: Soft, nontender, nondistended. Bowel sounds present. No organomegaly or mass.  EXTREMITIES: No pedal edema, cyanosis, or clubbing.  NEUROLOGIC ;unable to do full  neurological exam due to lethargy.  PSYCHIATRIC; Lethargic.  SKIN: No obvious rash, lesion, or ulcer.   LABORATORY PANEL:   CBC  Recent Labs Lab 12/06/14 0702  WBC 11.3*  HGB 10.8*  HCT 35.1  PLT 153   ------------------------------------------------------------------------------------------------------------------  Chemistries   Recent Labs Lab 12/06/14 0702  NA 139  K 6.0*  CL 92*  CO2 44*  GLUCOSE 287*  BUN 29*  CREATININE 0.94  CALCIUM 10.7*  AST 18  ALT 13*  ALKPHOS 59  BILITOT 0.7   ------------------------------------------------------------------------------------------------------------------  Cardiac Enzymes  Recent Labs Lab 12/06/14 0702  TROPONINI 0.04*   ------------------------------------------------------------------------------------------------------------------  RADIOLOGY:  Dg Chest Port 1 View  12/06/2014  CLINICAL DATA:  Acute onset of respiratory distress. Initial encounter. EXAM: PORTABLE CHEST 1 VIEW COMPARISON:  Chest radiograph performed 09/16/2014 FINDINGS: The lungs are mildly hypoexpanded. Mild bilateral linear opacities likely reflect atelectasis. Underlying vascular congestion is noted, with bilateral central opacities, suggestive of interstitial edema. A small right pleural effusion is noted. No pneumothorax is seen. The cardiomediastinal silhouette is mildly enlarged. No acute osseous abnormalities are identified. IMPRESSION: Lungs mildly hypoexpanded. Mild bilateral linear opacities likely reflect atelectasis. Underlying vascular congestion and mild cardiomegaly, with bilateral central airspace opacities, suggestive of interstitial edema. Small right pleural effusion noted.  Electronically Signed   By: Roanna Raider M.D.   On: 12/06/2014 07:32    EKG:   Orders placed or performed during the hospital encounter of 09/16/14  . EKG 12-Lead  . EKG 12-Lead  . ED EKG  . ED EKG  . EKG  Atrial fibrillation with 10 8 bpm no ST-T changes.   IMPRESSION AND PLAN:  81 year old female with altered mental status secondary to sepsis from UTI and pneumonia: Admit her to hospitalist service, start her on IV hydration, IV Zosyn, follow culture data. Keep  her nothing by mouth until she is more alert, start her dysphagia 1 diet after she becomes more alert.   1.sepsis present on admission: Source likely due to UTI and pneumonia: Antibiotics IV, IV hydration.  2. Lethargy secondary to sepsis and metabolic encephalopathy. Patient is able to communicate at baseline. Continue nothing by mouth, hydration with IV, #3 hyperkalemia secondary to  PO Potassium'' hold potassium tablets, give kayoxelate, monitor on off unit telemetry. 4.chronic afib; patient is on digoxin,; use IV digoxin because she is nothing by mouth. Start Coreg once she starts taking by mouth. #5 history of UTIs before follow urine culture data. #6 diabetes mellitus type 2 start sliding insulin coverage hold by mouth medications because she is nothing by mouth. 7 history of COPD no wheezing at this time use the DuoNeb's when necessary.  #8. Chronic diastolic heart failure: Hold the Lasix and also Zaroxolyn at this time due to sepsis restart them once he recovers Saturation dropped to 83% on now on 3 L. Still needing high flow oxygen. Due to hyperkalemia, hypoxia, sepsis.  Admitting her to CCU stepdown for close monitoring.  D/w daughter  . All the records are reviewed and case discussed with ED provider. Management plans discussed with the patient, family and they are in agreement.  CODE STATUS: full  TOTAL TIME TAKING CARE OF THIS PATIENT: 35  minutes.    Katha HammingKONIDENA,Satoya Feeley M.D on 12/06/2014 at 9:50  AM  Between 7am to 6pm - Pager - [phone]  After 6pm go to [email or web address] - password [email or web address] [facility]  [facility]  Office  [phone]  CC: Primary care physician; Patrice ParadiseMCLAUGHLIN, [name], MD  Note: This dictation was prepared with Dragon dictation along with smaller phrase technology. Any transcriptional errors that result from this process are unintentional.

## 2014-12-06 NOTE — Progress Notes (Signed)
ANTIBIOTIC CONSULT NOTE - INITIAL  Pharmacy Consult for Vancomycin and Zosyn  Indication: rule out sepsis  Allergies  Allergen Reactions  . Hydrocodone-Acetaminophen Nausea Only   Patient Measurements: Height: 5\' 3"  (160 cm) Weight: 180 lb 11.2 oz (81.965 kg) IBW/kg (Calculated) : 52.4 Adjusted Body Weight: 64 kg  Vital Signs: Temp: 101 F (38.3 C) (11/26 0654) Temp Source: Oral (11/26 0654) BP: 128/52 mmHg (11/26 0730) Pulse Rate: 105 (11/26 0730)  Labs:  Recent Labs  12/06/14 0702  WBC 11.3*  HGB 10.8*  PLT 153  CREATININE 0.94   Estimated Creatinine Clearance: 49.2 mL/min (by C-G formula based on Cr of 0.94). No results for input(s): VANCOTROUGH, VANCOPEAK, VANCORANDOM, GENTTROUGH, GENTPEAK, GENTRANDOM, TOBRATROUGH, TOBRAPEAK, TOBRARND, AMIKACINPEAK, AMIKACINTROU, AMIKACIN in the last 72 hours.   Microbiology: No results found for this or any previous visit (from the past 720 hour(s)).  Medical History: Past Medical History  Diagnosis Date  . Asthma   . A-fib (HCC)   . Arthritis   . Aortic stenosis   . Aortic insufficiency   . Hyperlipidemia   . Hypertension   . Sleep apnea   . CHF (congestive heart failure) (HCC)   . History of DVT (deep vein thrombosis)   . Diabetes mellitus type 2, uncontrolled (HCC)   . COPD (chronic obstructive pulmonary disease) (HCC)   . Cervical spondylolysis   . DDD (degenerative disc disease), cervical   . Anemia   . Mitral valve disorder   . Anxiety   . Chronic gout   . Restless leg syndrome   . Osteoarthritis    Assessment: 81 yo female who [facility] ED [facility] with respiratory distress and hyperkalemia. Pharmacy consulted for dosing and monitoring of vancomycin and Zosyn for Sepsis.   Temp: 101, HR: 108, WBC: 11.3, Scr 0.94 Blood cultures pending, UA pending, CXR showing small Rt pleural effusion.   Goal of Therapy:  Vancomycin trough level 15-20 mcg/ml  Plan:   Ke: 0.045  t1/2 15    Vd: 44.8   Will start patient on vancomycin regimen of 1250mg  every 24 hours with a 7 hour stack dose for a predicted trough at Css of 18.5. Patient receiving 1g in ED.  Trough will be ordered prior to 4 dose. Will start Zosyn 3.375g q8h EI, patient already received one dose in ED.  Pharmacy will continue to monitor patients labs and renal function and make adjustments as needed.   Cher NakaiSheema Darnell Jeschke, PharmD Pharmacy Resident 12/06/2014

## 2014-12-06 NOTE — Plan of Care (Signed)
Problem: Fluid Volume: Goal: Hemodynamic stability will improve BP stable. No pressors. NS 1.5 L in ED. Diaper soaked x2. Afib 70-90 on monitor. K 5.2 improved after kayexalate  Problem: Physical Regulation: Goal: Diagnostic test results will improve Outcome: Progressing Lactic acid 1.4 Goal: Signs and symptoms of infection will decrease Outcome: Progressing Blood cultures pending. Zosyn and vanco given. Afebrile. More alert and conversing but confused. WBC 11.3. U/A many bacteria  Problem: Respiratory: Goal: Ability to maintain adequate ventilation will improve Outcome: Progressing Weaned from 100% NRB to 3L/[place]. [facility]ears 2L/[place] [facility]

## 2014-12-06 NOTE — ED Provider Notes (Signed)
[facility] Emergency Department Provider Note    ____________________________________________  Time seen: 0705  I have reviewed the triage vital signs and the nursing notes.   HISTORY  Chief Complaint Respiratory Distress   History limited by: Altered Mental Status   HPI Mindy Cook is a 81 y.o. female who presents to the emergency department from [facility] facility because of concerns for respiratory distress. The patient herself is unable to give a good history. Per EMS the [facility] staff found the patient to be in respiratory distress and satting 76% on her baseline 2 L of oxygen. EMS gave the patient to do an abscess and put her on a nonrebreather. This brought her O2 sat into the low 90s. The patient herself denies any pain.   Past Medical History  Diagnosis Date  . Asthma   . A-fib (HCC)   . Arthritis   . Aortic stenosis   . Aortic insufficiency   . Hyperlipidemia   . Hypertension   . Sleep apnea   . CHF (congestive heart failure) (HCC)   . History of DVT (deep vein thrombosis)   . Diabetes mellitus type 2, uncontrolled (HCC)   . COPD (chronic obstructive pulmonary disease) (HCC)   . Cervical spondylolysis   . DDD (degenerative disc disease), cervical   . Anemia   . Mitral valve disorder   . Anxiety   . Chronic gout   . Restless leg syndrome   . Osteoarthritis     Patient Active Problem List   Diagnosis Date Noted  . Malnutrition of moderate degree (HCC) 09/18/2014  . Hypoxemia 09/16/2014  . Pressure ulcer 09/16/2014  . Chronic prescription benzodiazepine use   . Acute on chronic respiratory failure with hypoxia (HCC) 08/31/2014  . Acute bronchitis 08/31/2014  . UTI (urinary tract infection) 08/31/2014  . Gout attack 08/31/2014  . Generalized weakness 08/31/2014  . Pain in joint, lower leg 08/31/2014  . Acute on chronic respiratory failure (HCC) 08/28/2014  . COPD exacerbation (HCC) 08/28/2014  . Fall 08/28/2014  . A-fib  (HCC) 07/21/2014  . CAFL (chronic airflow limitation) (HCC) 07/21/2014  . H/O deep venous thrombosis 07/21/2014  . HLD (hyperlipidemia) 07/21/2014  . Apnea, sleep 07/21/2014  . Complete rotator cuff rupture of left shoulder 07/06/2014  . Cervico-occipital neuralgia 07/15/2013  . Cervical osteoarthritis 06/28/2013  . DDD (degenerative disc disease), cervical 06/28/2013  . Muscle spasms of neck 06/28/2013  . Renal colic 06/12/2013  . Neuropathy (HCC) 05/23/2013  . History of open heart surgery 05/07/2013  . Arthritis 04/23/2013  . Acquired cyst of kidney 11/20/2012  . Hypercapnemia 06/12/2012  . H/O aortic valve replacement 06/12/2012  . Congestive heart failure (HCC) 04/26/2012  . Heart failure (HCC) 04/26/2012  . Absolute anemia 02/08/2012  . Aortic valve defect 02/08/2012  . Benign essential HTN 02/08/2012  . Bladder infection, chronic 02/08/2012  . Calculus of gallbladder 02/08/2012  . Calculus of kidney 02/08/2012  . Biliary calculi 02/08/2012  . Dermatophytosis of scalp and beard 02/08/2012  . Incomplete bladder emptying 02/08/2012  . Disorder of mitral valve 02/08/2012  . Mixed incontinence 02/08/2012  . Diabetes mellitus, type 2 (HCC) 02/08/2012    Past Surgical History  Procedure Laterality Date  . Abdominal hysterectomy    . Cardiac catheterization    . Colonoscopy      Current Outpatient Rx  Name  Route  Sig  Dispense  Refill  . allopurinol (ZYLOPRIM) 100 MG tablet   Oral   Take 100 mg  by mouth daily.         Marland Kitchen ALPRAZolam (XANAX) 0.25 MG tablet   Oral   Take 1 tablet (0.25 mg total) by mouth every evening.   30 tablet   0   . azelastine (ASTELIN) 0.1 % nasal spray   Each Nare   Place 1 spray into both nostrils 2 (two) times daily.         . carvedilol (COREG) 3.125 MG tablet   Oral   Take 3.125 mg by mouth 2 (two) times daily.         . cefUROXime (CEFTIN) 250 MG tablet   Oral   Take 1 tablet (250 mg total) by mouth 2 (two) times daily with  a meal.   8 tablet   0   . clotrimazole (LOTRIMIN) 1 % cream   Topical   Apply 1 application topically 2 (two) times daily. Pt mixes with zinc oxide cream.         . Cranberry 250 MG TABS   Oral   Take 500 mg by mouth daily.         . cyclobenzaprine (FLEXERIL) 5 MG tablet   Oral   Take 5 mg by mouth 3 (three) times daily as needed for muscle spasms.         . diclofenac sodium (VOLTAREN) 1 % GEL   Topical   Apply 2 g topically 2 (two) times daily.         . digoxin 62.5 MCG TABS   Oral   Take 0.0625 mg by mouth daily.   30 tablet   0   . docusate sodium (COLACE) 100 MG capsule   Oral   Take 1 capsule (100 mg total) by mouth 2 (two) times daily.   10 capsule   0   . ferrous sulfate 325 (65 FE) MG tablet   Oral   Take 325 mg by mouth daily.         . fluticasone (FLONASE) 50 MCG/ACT nasal spray   Each Nare   Place 2 sprays into both nostrils daily as needed for allergies or rhinitis.         . Fluticasone-Salmeterol (ADVAIR) 250-50 MCG/DOSE AEPB   Inhalation   Inhale 1 puff into the lungs 2 (two) times daily.         . furosemide (LASIX) 40 MG tablet   Oral   Take 0.5 tablets (20 mg total) by mouth daily.   30 tablet   0   . gabapentin (NEURONTIN) 300 MG capsule   Oral   Take 600 mg by mouth 3 (three) times daily. Pt is able to take one extra capsule at bedtime.         . gabapentin (NEURONTIN) 300 MG capsule   Oral   Take 1 capsule (300 mg total) by mouth at bedtime.   30 capsule   0   . glimepiride (AMARYL) 1 MG tablet   Oral   Take 1 mg by mouth 2 (two) times daily.         . insulin regular (NOVOLIN R,HUMULIN R) 100 units/mL injection   Subcutaneous   Inject into the skin daily. Per sliding scale         . ipratropium-albuterol (DUONEB) 0.5-2.5 (3) MG/3ML SOLN   Nebulization   Take 3 mLs by nebulization every 4 (four) hours as needed.   360 mL   1   . lactulose (CHRONULAC) 10 GM/15ML solution   Oral  Take 10 g by  mouth 2 (two) times daily as needed for mild constipation.         . meclizine (ANTIVERT) 12.5 MG tablet   Oral   Take 12.5 mg by mouth 3 (three) times daily as needed for dizziness.         . metolazone (ZAROXOLYN) 5 MG tablet   Oral   Take 5 mg by mouth 3 (three) times a week. Pt takes on Monday, Thursday, and Sunday.         . mirabegron ER (MYRBETRIQ) 50 MG TB24 tablet   Oral   Take 1 tablet (50 mg total) by mouth daily.   30 tablet   2   . mometasone (NASONEX) 50 MCG/ACT nasal spray   Nasal   Place 1 spray into the nose daily.          . montelukast (SINGULAIR) 10 MG tablet   Oral   Take 10 mg by mouth daily.         . nitrofurantoin (MACRODANTIN) 50 MG capsule   Oral   Take 1 capsule (50 mg total) by mouth at bedtime. Patient not taking: Reported on 09/16/2014   90 capsule   0   . polyethylene glycol (MIRALAX / GLYCOLAX) packet   Oral   Take 17 g by mouth daily.   14 each   0   . potassium chloride SA (K-DUR,KLOR-CON) 20 MEQ tablet   Oral   Take 20 mEq by mouth 2 (two) times daily.         . pravastatin (PRAVACHOL) 40 MG tablet   Oral   Take 40 mg by mouth at bedtime.         . predniSONE (DELTASONE) 10 MG tablet      Take 6 tabs first day, 5 tab on day 2, then 4 on day 3rd, 3 tabs on day 4th , 2 tab on day 5th, and 1 tab on 6th day.   21 tablet   0   . Probiotic Product (PROBIOTIC DAILY PO)   Oral   Take 250 mg by mouth 2 (two) times daily.         . promethazine (PHENERGAN) 12.5 MG tablet   Oral   Take 12.5 mg by mouth every 8 (eight) hours as needed for nausea or vomiting.         Marland Kitchen rOPINIRole (REQUIP) 0.25 MG tablet   Oral   Take 0.25 mg by mouth at bedtime.         . senna (SENOKOT) 8.6 MG TABS tablet   Oral   Take 1 tablet (8.6 mg total) by mouth at bedtime as needed for mild constipation.   120 each   0   . sitaGLIPtin (JANUVIA) 50 MG tablet   Oral   Take 50 mg by mouth daily.         . traMADol (ULTRAM) 50 MG  tablet   Oral   Take 1 tablet (50 mg total) by mouth every 6 (six) hours as needed for moderate pain. Patient taking differently: Take 50 mg by mouth every 12 (twelve) hours as needed for moderate pain.    30 tablet   0   . ZINC OXIDE, TOPICAL, 10 % CREA   Apply externally   Apply 1 application topically 2 (two) times daily. Pt mixes with Clotrimazole cream.           Allergies Hydrocodone-acetaminophen  Family History  Problem Relation Age of Onset  .  Heart failure Mother   . Cancer Father   . Heart disease Father   . Breast cancer Sister   . Fibromyalgia Sister     Social History Social History  Substance Use Topics  . Smoking status: Never Smoker   . Smokeless tobacco: None  . Alcohol Use: No    Review of Systems Unable to obtain full review of systems secondary to altered mental status  ____________________________________________   PHYSICAL EXAM:  VITAL SIGNS: ED Triage Vitals  Enc Vitals Group     BP 12/06/14 0654 165/67 mmHg     Pulse Rate 12/06/14 0654 97     Resp 12/06/14 0654 28     Temp 12/06/14 0654 101 F (38.3 C)     Temp Source 12/06/14 0654 Oral     SpO2 12/06/14 0654 99 %     Weight 12/06/14 0654 180 lb 11.2 oz (81.965 kg)     Height 12/06/14 0654 5\' 3"  (1.6 m)     Head Cir --      Peak Flow --      Pain Score 12/06/14 0657 0   Constitutional: Awake and alert. Oriented to place but not event. Eyes: Conjunctivae are normal. PERRL. Normal extraocular movements. ENT   Head: Normocephalic and atraumatic.   Nose: No congestion/rhinnorhea.   Mouth/Throat: Mucous membranes are moist.   Neck: No stridor. Hematological/Lymphatic/Immunilogical: No cervical lymphadenopathy. Cardiovascular: Tachycardic, irregularly irregular rhythm. No murmurs, rubs, or gallops. Respiratory: Tachypnea. Breath sounds are clear and equal bilaterally. No wheezes/rales/rhonchi. Gastrointestinal: Soft and nontender. No distention.  Genitourinary:  Deferred Musculoskeletal: Normal range of motion in all extremities. No joint effusions.  No lower extremity tenderness nor edema. Neurologic:  Normal speech and language. No gross focal neurologic deficits are appreciated.  Skin:  Skin is warm, dry and intact. No rash noted. Psychiatric: Mood and affect are normal. Speech and behavior are normal. Patient exhibits appropriate insight and judgment.  ____________________________________________    LABS (pertinent positives/negatives)  Labs Reviewed  COMPREHENSIVE METABOLIC PANEL - Abnormal; Notable for the following:    Potassium 6.0 (*)    Chloride 92 (*)    CO2 44 (*)    Glucose, Bld 287 (*)    BUN 29 (*)    Calcium 10.7 (*)    ALT 13 (*)    GFR calc non Af Amer 56 (*)    Anion gap 3 (*)    All other components within normal limits  CBC WITH DIFFERENTIAL/PLATELET - Abnormal; Notable for the following:    WBC 11.3 (*)    RBC 3.70 (*)    Hemoglobin 10.8 (*)    MCHC 30.8 (*)    RDW 17.2 (*)    Neutro Abs 10.0 (*)    Lymphs Abs 0.7 (*)    All other components within normal limits  URINALYSIS COMPLETEWITH MICROSCOPIC ([facility] ONLY) - Abnormal; Notable for the following:    Color, Urine YELLOW (*)    APPearance TURBID (*)    Protein, ur 100 (*)    Nitrite POSITIVE (*)    Leukocytes, UA 2+ (*)    Bacteria, UA MANY (*)    All other components within normal limits  TROPONIN I - Abnormal; Notable for the following:    Troponin I 0.04 (*)    All other components within normal limits  CULTURE, BLOOD (ROUTINE X 2)  CULTURE, BLOOD (ROUTINE X 2)  URINE CULTURE  LACTIC ACID, PLASMA  LACTIC ACID, PLASMA     ____________________________________________  EKG  I, Phineas Semen, attending physician, personally viewed and interpreted this EKG  EKG Time: 0700 Rate: 108 Rhythm: atrial fibrillation Axis: normal Intervals: qtc 484 QRS: narrow, q waves III, aVF, V1 ST changes: no st elevation Impression: abnormal  ekg ____________________________________________    RADIOLOGY  CXR IMPRESSION: Lungs mildly hypoexpanded. Mild bilateral linear opacities likely reflect atelectasis. Underlying vascular congestion and mild cardiomegaly, with bilateral central airspace opacities, suggestive of interstitial edema. Small right pleural effusion noted.   ____________________________________________   PROCEDURES  Procedure(s) performed: None  Critical Care performed: No  ____________________________________________   INITIAL IMPRESSION / ASSESSMENT AND PLAN / ED COURSE  Pertinent labs & imaging results that were available during my care of the patient were reviewed by me and considered in my medical decision making (see chart for details).  Patient presented to the emergency department today from living facility because of concerns for respiratory distress. On exam here patient unable to give a good history. Patient oxygen saturation did improve on nonrebreather. Workup in code sepsis was initiated. Patient does appear to have a UTI. There is also some question of atelectasis in her lungs. Mild leukocytosis and lactic acid is normal. Potassium was slightly elevated but no EKG changes. Patient given fluids and broad spectrum abx. Admitted to the hospitalist service for further work up and evaluation.  ____________________________________________   FINAL CLINICAL IMPRESSION(S) / ED DIAGNOSES  Final diagnoses:  UTI (lower urinary tract infection)  Respiratory distress  Sepsis, due to unspecified organism [facility])     Phineas Semen, MD 12/06/14 [phone]

## 2014-12-07 ENCOUNTER — Inpatient Hospital Stay: Payer: Medicare Other

## 2014-12-07 LAB — BASIC METABOLIC PANEL
ANION GAP: 6 (ref 5–15)
BUN: 21 mg/dL — ABNORMAL HIGH (ref 6–20)
CO2: 36 mmol/L — ABNORMAL HIGH (ref 22–32)
Calcium: 10.1 mg/dL (ref 8.9–10.3)
Chloride: 100 mmol/L — ABNORMAL LOW (ref 101–111)
Creatinine, Ser: 0.71 mg/dL (ref 0.44–1.00)
GFR calc Af Amer: >60 mL/min (ref >60)
GLUCOSE: 170 mg/dL — AB (ref 65–99)
POTASSIUM: 4 mmol/L (ref 3.5–5.1)
Sodium: 142 mmol/L (ref 135–145)

## 2014-12-07 LAB — CBC
HCT: 31.5 % — ABNORMAL LOW (ref 35.0–47.0)
HEMOGLOBIN: 10.1 g/dL — AB (ref 12.0–16.0)
MCH: 30.5 pg (ref 26.0–34.0)
MCHC: 32 g/dL (ref 32.0–36.0)
MCV: 95.4 fL (ref 80.0–100.0)
PLATELETS: 105 10*3/uL — AB (ref 150–440)
RBC: 3.3 MIL/uL — AB (ref 3.80–5.20)
RDW: 17 % — ABNORMAL HIGH (ref 11.5–14.5)
WBC: 8.8 10*3/uL (ref 3.6–11.0)

## 2014-12-07 LAB — GLUCOSE, CAPILLARY
GLUCOSE-CAPILLARY: 188 mg/dL — AB (ref 65–99)
Glucose-Capillary: 115 mg/dL — ABNORMAL HIGH (ref 65–99)
Glucose-Capillary: 175 mg/dL — ABNORMAL HIGH (ref 65–99)
Glucose-Capillary: 191 mg/dL — ABNORMAL HIGH (ref 65–99)
Glucose-Capillary: 99 mg/dL (ref 65–99)

## 2014-12-07 LAB — DIGOXIN LEVEL: DIGOXIN LVL: 0.3 ng/mL — AB (ref 0.8–2.0)

## 2014-12-07 MED ORDER — SODIUM CHLORIDE 0.9 % IV SOLN
INTRAVENOUS | Status: AC
  Administered 2014-12-07: 13:00:00 via INTRAVENOUS

## 2014-12-07 MED ORDER — IPRATROPIUM-ALBUTEROL 0.5-2.5 (3) MG/3ML IN SOLN: 3.0000 mL | Freq: Four times a day (QID) | RESPIRATORY_TRACT | Status: DC | PRN

## 2014-12-07 MED ORDER — HYDRALAZINE HCL 20 MG/ML IJ SOLN
10.0000 mg | Freq: Four times a day (QID) | INTRAMUSCULAR | Status: DC | PRN
  Administered 2014-12-07 – 2014-12-08 (×2): 10 mg via INTRAVENOUS
  Filled 2014-12-07 (×2): qty 1

## 2014-12-07 MED ORDER — ALPRAZOLAM 0.25 MG PO TABS: 0.2500 mg | ORAL_TABLET | Freq: Two times a day (BID) | ORAL | Status: DC | PRN

## 2014-12-07 NOTE — Progress Notes (Signed)
Attempted bedside swallow eval with patient per order but pt is to lethargic. Pt did not understand instructions so attempted was ended. Pt remains npo per dr. Luberta Mutterkonidena

## 2014-12-07 NOTE — Progress Notes (Deleted)
eLink Physician-Brief Progress Note Patient Name: Mindy GuysShelby J Zinger DOB: 03-Mar-1933 MRN: [id number]   Date of Service  12/07/2014  HPI/Events of Note  81 yo female with PMH of Dementia, DM - Type II , COPD, chronic diastolic heart failure and sepsis d/t uti. Moved to ICU tonight for AFIB with RVR. Amiodarone IV infusion started. HR now 95 - 110. Management per primary team. Note the patient is a DNR.  eICU Interventions  Continue current management.      Intervention Category Evaluation Type: New Patient Evaluation  Lenell AntuSommer,Zaxton Angerer Eugene 12/07/2014, 7:27 PM

## 2014-12-07 NOTE — Progress Notes (Signed)
PT Cancellation Note  Patient Details Name: Mindy Cook MRN: [id number] DOB: 01/26/1933   Cancelled Treatment:    Reason Eval/Treat Not Completed: Patient's level of consciousness Attempted to she pt this afternoon, she was too lethargic, confused and generally unable to meaningfully answer questions or stay awake/on task. Will try back later as appropriate.  Pt's O2 did remain in the mid 90s on 5 liters during brief questioning, etc.  Mindy Cook, PT, DPT [id number]  Malachi ProGalen R [name] 12/07/2014, 5:52 PM

## 2014-12-07 NOTE — Progress Notes (Signed)
[facility] [place] [facility]   PATIENT NAME: Mindy Cook    MR#:  [id number]  DATE OF BIRTH:  [birth year]  SUBJECTIVE: Admitted for altered mental status and respiratory distress. Found to have UTI and sepsis. Patient also had hyperkalemia on admission. She is seen in intensive care unit today.more  Alert and conversing .confused at times. Marland Kitchen He is afebrile.   CHIEF COMPLAINT:   Chief Complaint  Patient presents with  . Respiratory Distress    REVIEW OF SYSTEMS:   ROS CONSTITUTIONAL: No fever, fatigue or weakness.  EYES: No blurred or double vision.  EARS, NOSE, AND THROAT: No tinnitus or ear pain.  RESPIRATORY: No cough, shortness of breath, wheezing or hemoptysis.  CARDIOVASCULAR: No chest pain, orthopnea, edema.  GASTROINTESTINAL: No nausea, vomiting, diarrhea or abdominal pain.  GENITOURINARY: No dysuria, hematuria.  ENDOCRINE: No polyuria, nocturia,  HEMATOLOGY: No anemia, easy bruising or bleeding SKIN: No rash or lesion. MUSCULOSKELETAL: No joint pain or arthritis.   NEUROLOGIC: No tingling, numbness, weakness.  PSYCHIATRY:anxious.  Allergies  Allergen Reactions  . Hydrocodone-Acetaminophen Nausea Only    VITALS:  Blood pressure 154/89, pulse 47, temperature 99.5 F (37.5 C), temperature source Axillary, resp. rate 26, height  (1.6 m), weight 81.965 kg (180 lb 11.2 oz), SpO2 83 %.  PHYSICAL EXAMINATION:  GENERAL:  81 y.o.-year-old patient lying in the bed with no acute distress.  EYES: Pupils equal, round, reactive to light and accommodation. No scleral icterus. Extraocular muscles intact.  HEENT: Head atraumatic, normocephalic. Oropharynx and nasopharynx clear.  NECK:  Supple, no jugular venous distention. No thyroid enlargement, no tenderness.  LUNGS: Normal breath sounds bilaterally, no wheezing, rales,rhonchi or crepitation. No use of accessory muscles of respiration.  CARDIOVASCULAR: S1, S2 normal. No murmurs, rubs, or  gallops.  ABDOMEN: Soft, nontender, nondistended. Bowel sounds present. No organomegaly or mass.  EXTREMITIES: No pedal edema, cyanosis, or clubbing.  NEUROLOGIC: Cranial nerves II through XII are intact. Muscle strength 5/5 in all extremities. Sensation intact. Gait not checked.  PSYCHIATRIC: The patient is alert and oriented x 3.  SKIN: No obvious rash, lesion, or ulcer.    LABORATORY PANEL:   CBC  Recent Labs Lab 12/07/14 0532  WBC 8.8  HGB 10.1*  HCT 31.5*  PLT 105*   ------------------------------------------------------------------------------------------------------------------  Chemistries   Recent Labs Lab 12/06/14 0702  12/07/14 0532  NA 139  --  142  K 6.0*  < > 4.0  CL 92*  --  100*  CO2 44*  --  36*  GLUCOSE 287*  --  170*  BUN 29*  --  21*  CREATININE 0.94  --  0.71  CALCIUM 10.7*  --  10.1  AST 18  --   --   ALT 13*  --   --   ALKPHOS 59  --   --   BILITOT 0.7  --   --   < > = values in this interval not displayed. ------------------------------------------------------------------------------------------------------------------  Cardiac Enzymes  Recent Labs Lab 12/06/14 0702  TROPONINI 0.04*   ------------------------------------------------------------------------------------------------------------------  RADIOLOGY:  Dg Chest Port 1 View  12/06/2014  CLINICAL DATA:  Acute onset of respiratory distress. Initial encounter. EXAM: PORTABLE CHEST 1 VIEW COMPARISON:  Chest radiograph performed 09/16/2014 FINDINGS: The lungs are mildly hypoexpanded. Mild bilateral linear opacities likely reflect atelectasis. Underlying vascular congestion is noted, with bilateral central opacities, suggestive of interstitial edema. A small right pleural effusion is noted. No pneumothorax is seen. The cardiomediastinal silhouette is  mildly enlarged. No acute osseous abnormalities are identified. IMPRESSION: Lungs mildly hypoexpanded. Mild bilateral linear opacities  likely reflect atelectasis. Underlying vascular congestion and mild cardiomegaly, with bilateral central airspace opacities, suggestive of interstitial edema. Small right pleural effusion noted. Electronically Signed   By: Roanna RaiderJeffery  Chang M.D.   On: 12/06/2014 07:32    EKG:   Orders placed or performed during the hospital encounter of 09/16/14  . EKG 12-Lead  . EKG 12-Lead  . ED EKG  . ED EKG  . EKG    ASSESSMENT AND PLAN:  1. Sepsis present on admission secondary to UTI: Follow urine culture data, continue IV antibiotics with vancomycin and Zosyn., Follow urine cultures.moniotr UOP with foley  #2 metabolic encephalopathy secondary to sepsis: Resolved now back to baseline. Start speech therapy evaluation, continue IV hydration until then. 3. Acute hypoxia secondary to possible pneumonia: Repeat chest x-ray today. Continue oxygen,abx. 4.hyperkalemia; resolved. Hold the by mouth potassium supplements.  #5 dementia bed bound status: Continue Xanax  #Diabetes mellitus type 2 hold Amaryl use sliding scale with coverage only. #6 chronic diastolic heart failure; regard to restart the Zaroxolyn in, Lasix from tomorrow. 7.History of COPD: Patient has no wheezing at this time. Use nebulizers when necessary. At 8.bradycardia ;hold Coreg and digoxin and obtain a dig levels.  All the records are reviewed and case discussed with Care Management/Social Workerr. Management plans discussed with the patient, family and they are in agreement.  CODE STATUS:full  TOTAL TIME TAKING CARE OF THIS PATIENT: 35 minutes.  Critical care time  POSSIBLE D/C IN 1-2  DAYS, DEPENDING ON CLINICAL CONDITION.   Katha HammingKONIDENA,Rishith Siddoway M.D on 12/07/2014 at 12:33 PM  Between 7am to 6pm - Pager - [phone]  After 6pm go to [email or web address] - password [email or web address] [facility]  [facility]  Office  [phone]  CC: Primary care physician; Patrice ParadiseMCLAUGHLIN, [name], MD   Note: This dictation was prepared with Dragon  dictation along with smaller phrase technology. Any transcriptional errors that result from this process are unintentional.

## 2014-12-08 ENCOUNTER — Inpatient Hospital Stay: Payer: Medicare Other

## 2014-12-08 LAB — BLOOD GAS, ARTERIAL
ACID-BASE EXCESS: 15.7 mmol/L — AB (ref 0.0–3.0)
Allens test (pass/fail): POSITIVE — AB
BICARBONATE: 41.3 meq/L — AB (ref 21.0–28.0)
FIO2: 0.32
O2 Saturation: 95.6 %
PCO2 ART: 53 mmHg — AB (ref 32.0–48.0)
PH ART: 7.5 — AB (ref 7.350–7.450)
Patient temperature: 37
pO2, Arterial: 72 mmHg — ABNORMAL LOW (ref 83.0–108.0)

## 2014-12-08 LAB — URINE CULTURE

## 2014-12-08 LAB — GLUCOSE, CAPILLARY
GLUCOSE-CAPILLARY: 205 mg/dL — AB (ref 65–99)
GLUCOSE-CAPILLARY: 183 mg/dL — AB (ref 65–99)
Glucose-Capillary: 148 mg/dL — ABNORMAL HIGH (ref 65–99)
Glucose-Capillary: 197 mg/dL — ABNORMAL HIGH (ref 65–99)
Glucose-Capillary: 212 mg/dL — ABNORMAL HIGH (ref 65–99)

## 2014-12-08 LAB — PHOSPHORUS: PHOSPHORUS: 1.8 mg/dL — AB (ref 2.5–4.6)

## 2014-12-08 LAB — MAGNESIUM: MAGNESIUM: 1.9 mg/dL (ref 1.7–2.4)

## 2014-12-08 LAB — AMMONIA: Ammonia: 25 umol/L (ref 9–35)

## 2014-12-08 MED ORDER — SODIUM CHLORIDE 0.9 % IV SOLN
1.0000 g | INTRAVENOUS | Status: DC
  Administered 2014-12-08 – 2014-12-10 (×3): 1 g via INTRAVENOUS
  Filled 2014-12-08 (×4): qty 1

## 2014-12-08 MED ORDER — SODIUM CHLORIDE 0.9 % IV SOLN
INTRAVENOUS | Status: AC
  Administered 2014-12-08 – 2014-12-09 (×2): via INTRAVENOUS

## 2014-12-08 MED ORDER — FUROSEMIDE 10 MG/ML IJ SOLN
20.0000 mg | Freq: Once | INTRAMUSCULAR | Status: AC
  Administered 2014-12-08: 20 mg via INTRAVENOUS
  Filled 2014-12-08: qty 2

## 2014-12-08 MED ORDER — SODIUM CHLORIDE 0.9 % IJ SOLN
3.0000 mL | Freq: Two times a day (BID) | INTRAMUSCULAR | Status: DC
  Administered 2014-12-08 – 2014-12-13 (×9): 3 mL via INTRAVENOUS

## 2014-12-08 NOTE — Progress Notes (Signed)
Transferred from [facility] on 11/28, [facility] at approx. 15:00 prior to patients arrival. Pt is on 3 L oxygen, remains disoriented, family at bedside, speech evaluated patient and initiated diet, pt declines food at dinner, FSBS slightly elevated, insulin held per pt's son request, ted hoses on, incontinent of urine, no bm throughout shift, IV fluids infusing, ST on tele- MD notified, receiving IV antibiotics, denies pain, uneventful shift.

## 2014-12-08 NOTE — Progress Notes (Signed)
[facility] [place] at [facility]   PATIENT NAME: Mindy Cook    MR#:  [id number]  DATE OF BIRTH:  Nov 20, 1933  SUBJECTIVE: Admitted for altered mental status and respiratory distress. Found to have UTI and sepsis. Patient also had hyperkalemia on admission.  Found to have ESBL UTI. Delirious with episodes of confusion. Hypoxia resolving.   CHIEF COMPLAINT:   Chief Complaint  Patient presents with  . Respiratory Distress    REVIEW OF SYSTEMS:   Review of Systems  Unable to perform ROS: medical condition     Allergies  Allergen Reactions  . Hydrocodone-Acetaminophen Nausea Only    VITALS:  Blood pressure 143/61, pulse 54, temperature 96.7 F (35.9 C), temperature source Axillary, resp. rate 34, height 5\' 3"  (1.6 m), weight 81.965 kg (180 lb 11.2 oz), SpO2 99 %.  PHYSICAL EXAMINATION:  GENERAL:  81 y.o.-year-old patient lying in the bed with no acute distress.  EYES: Pupils equal, round, reactive to light and accommodation. No scleral icterus. Extraocular muscles intact.  HEENT: Head atraumatic, normocephalic. Oropharynx and nasopharynx clear.  NECK:  Supple, no jugular venous distention. No thyroid enlargement, no tenderness.  LUNGS: Normal breath sounds bilaterally, no wheezing, rales,rhonchi or crepitation. No use of accessory muscles of respiration.  CARDIOVASCULAR: S1, S2 normal. No murmurs, rubs, or gallops.  ABDOMEN: Soft, nontender, nondistended. Bowel sounds present. No organomegaly or mass.  EXTREMITIES: No pedal edema, cyanosis, or clubbing.  NEUROLOGIC: Cranial nerves II through XII are intact. Muscle strength 5/5 in all extremities. Sensation intact. Gait not checked.  PSYCHIATRIC: Confused  SKIN: No obvious rash, lesion, or ulcer.    LABORATORY PANEL:   CBC  Recent Labs Lab 12/07/14 0532  WBC 8.8  HGB 10.1*  HCT 31.5*  PLT 105*    ------------------------------------------------------------------------------------------------------------------  Chemistries   Recent Labs Lab 12/06/14 0702  12/07/14 0532  NA 139  --  142  K 6.0*  < > 4.0  CL 92*  --  100*  CO2 44*  --  36*  GLUCOSE 287*  --  170*  BUN 29*  --  21*  CREATININE 0.94  --  0.71  CALCIUM 10.7*  --  10.1  AST 18  --   --   ALT 13*  --   --   ALKPHOS 59  --   --   BILITOT 0.7  --   --   < > = values in this interval not displayed. ------------------------------------------------------------------------------------------------------------------  Cardiac Enzymes  Recent Labs Lab 12/06/14 0702  TROPONINI 0.04*   ------------------------------------------------------------------------------------------------------------------  RADIOLOGY:  Dg Chest 1 View  12/07/2014  CLINICAL DATA:  Hypoxia and confusion EXAM: CHEST 1 VIEW COMPARISON:  12/06/2014 FINDINGS: Stable enlarged cardiac silhouette. The central perihilar opacities are not changed. Mild linear interstitial opacities more peripherally. Small amount of fluid along the new RIGHT horizontal fissure is increased. No focal consolidation. No pneumothorax. IMPRESSION: Findings suggest pulmonary edema and mild interstitial edema. No significant change from prior. Electronically Signed   By: Genevive BiStewart  Edmunds M.D.   On: 12/07/2014 15:59   Ct Head Wo Contrast  12/07/2014  CLINICAL DATA:  Altered mental status today. No known injury. Initial encounter. EXAM: CT HEAD WITHOUT CONTRAST TECHNIQUE: Contiguous axial images were obtained from the base of the skull through the vertex without intravenous contrast. COMPARISON:  Head CT scan 08/28/2014. FINDINGS: Cortical atrophy is again seen. There is no evidence of acute intracranial abnormality including hemorrhage, infarct, mass lesion, mass effect, midline shift or abnormal  extra-axial fluid collection. No hydrocephalus or pneumocephalus. The calvarium is  intact. Carotid atherosclerosis is noted. Imaged paranasal sinuses and mastoid air cells are clear. IMPRESSION: No acute abnormality. Atrophy. Atherosclerosis. Electronically Signed   By: Drusilla Kanner M.D.   On: 12/07/2014 15:34    EKG:   Orders placed or performed during the hospital encounter of 09/16/14  . EKG 12-Lead  . EKG 12-Lead  . ED EKG  . ED EKG  . EKG    ASSESSMENT AND PLAN:  1. Sepsis ESBL WRU:[id number] ertapenam  #2 metabolic encephalopathy secondary to sepsis: Resolved now back to baseline. Start speech therapy evaluation, continue IV hydration until then . 3. Acute hypoxia secondary to possible pneumonia: /Pulmonary edema by x-ray:  4.hyperkalemia; resolved. Hold the by mouth potassium supplements.  #5 dementia bed bound status: Continue Xanax   #Diabetes mellitus type 2 hold Amaryl use sliding scale with coverage only.  #6 chronic diastolic heart failure; regard to restart the Zaroxolyn in, Lasix  7.History of COPD: Patient has no wheezing at this time. Use nebulizers when necessary. At 8.bradycardia ;hold Coreg and digoxin and obtain a dig levels. #9 delirium secondary to ESBL UTI, baseline dementia. CT head unremarkable. Check the ABG, ammonia levels. Prognosis poor. All the records are reviewed and case discussed with Care Management/Social Workerr. Management plans discussed with the patient, family and they are in agreement.  CODE STATUS:full  TOTAL TIME TAKING CARE OF THIS PATIENT: 35 minutes.  Critical care time  POSSIBLE D/C IN 1-2  DAYS, DEPENDING ON CLINICAL CONDITION.   Katha Hamming M.D on 12/08/2014 at 1:01 PM  Between 7am to 6pm - Pager - [phone]  After 6pm go to [email or web address] - password [email or web address] [facility]  [facility]  Office  [phone]  CC: Primary care physician; Patrice Paradise, MD   Note: This dictation was prepared with Dragon dictation along with smaller phrase technology. Any transcriptional errors  that result from this process are unintentional.

## 2014-12-08 NOTE — Progress Notes (Signed)
Notified Dr. Luberta MutterKonidena of 10 beat run of v tach via telephone, per MD have lab check mag and phos.

## 2014-12-08 NOTE — Consult Note (Signed)
ANTIBIOTIC CONSULT NOTE - INITIAL  Pharmacy Consult for Ertapenem  Indication: ESBL UTI  Allergies  Allergen Reactions  . Hydrocodone-Acetaminophen Nausea Only    Patient Measurements: Height:  (160 cm) Weight: 180 lb 11.2 oz (81.965 kg) IBW/kg (Calculated) : 52.4  Vital Signs: Temp: 96.7 F (35.9 C) (11/28 0800) Temp Source: Axillary (11/28 0800) BP: 143/61 mmHg (11/28 1100) Pulse Rate: 54 (11/28 1100) Intake/Output from previous day: 11/27 0701 - 11/28 0700 In: 2140.8 [I.V.:1540.8; IV Piggyback:600] Out: -  Intake/Output from this shift: Total I/O In: 200 [I.V.:200] Out: -   Recent Labs  12/06/14 0702 12/07/14 0532  WBC 11.3* 8.8  HGB 10.8* 10.1*  PLT 153 105*  CREATININE 0.94 0.71   Estimated Creatinine Clearance: 57.8 mL/min (by C-G formula based on Cr of 0.71). No results for input(s): VANCOTROUGH, VANCOPEAK, VANCORANDOM, GENTTROUGH, GENTPEAK, GENTRANDOM, TOBRATROUGH, TOBRAPEAK, TOBRARND, AMIKACINPEAK, AMIKACINTROU, AMIKACIN in the last 72 hours.   Microbiology: Recent Results (from the past 720 hour(s))  Culture, blood (routine x 2)     Status: None (Preliminary result)   Collection Time: 12/06/14  7:02 AM  Result Value Ref Range Status   Specimen Description BLOOD LEFT AC  Final   Special Requests BOTTLES DRAWN AEROBIC AND ANAEROBIC  8CC  Final   Culture  Setup Time   Final    GRAM POSITIVE COCCI AEROBIC BOTTLE ONLY CRITICAL RESULT CALLED TO, READ BACK BY AND VERIFIED WITH: PAM RUMLEY @ 2132 ON 12/07/2014 BY [facility] CONFIRMED BY PAULA MARY    Culture   Final    COAGULASE NEGATIVE STAPHYLOCOCCUS AEROBIC BOTTLE ONLY Results consistent with contamination.    Report Status PENDING  Incomplete  Culture, blood (routine x 2)     Status: None (Preliminary result)   Collection Time: 12/06/14  7:02 AM  Result Value Ref Range Status   Specimen Description BLOOD RIGHT HAND  Final   Special Requests BOTTLES DRAWN AEROBIC AND ANAEROBIC  3CC  Final   Culture  NO GROWTH 2 DAYS  Final   Report Status PENDING  Incomplete  Urine culture     Status: None   Collection Time: 12/06/14  7:51 AM  Result Value Ref Range Status   Specimen Description URINE, RANDOM  Final   Special Requests NONE  Final   Culture   Final    >=100,000 COLONIES/mL ESCHERICHIA COLI Results Called to: AMELIA BERRY AT 1030 ON 12/08/14 CTJ    Report Status 12/08/2014 FINAL  Final   Organism ID, Bacteria ESCHERICHIA COLI  Final      Susceptibility   Escherichia coli - MIC*    AMPICILLIN >=32 RESISTANT Resistant     CEFTAZIDIME 16 RESISTANT Resistant     CEFAZOLIN >=64 RESISTANT Resistant     CEFTRIAXONE >=64 RESISTANT Resistant     GENTAMICIN <=1 SENSITIVE Sensitive     IMIPENEM <=0.25 SENSITIVE Sensitive     TRIMETH/SULFA <=20 SENSITIVE Sensitive     Extended ESBL POSITIVE Resistant     CIPROFLOXACIN Value in next row Sensitive      SENSITIVE<=0.25    NITROFURANTOIN Value in next row Resistant      RESISTANT>=512    PIP/TAZO Value in next row Sensitive      SENSITIVE<=4    * >=100,000 COLONIES/mL ESCHERICHIA COLI  MRSA PCR Screening     Status: None   Collection Time: 12/06/14 11:31 AM  Result Value Ref Range Status   MRSA by PCR NEGATIVE NEGATIVE Final    Comment:  The GeneXpert MRSA Assay (FDA approved for NASAL specimens only), is one component of a comprehensive MRSA colonization surveillance program. It is not intended to diagnose MRSA infection nor to guide or monitor treatment for MRSA infections.     Medical History: Past Medical History  Diagnosis Date  . Asthma   . A-fib (HCC)   . Arthritis   . Aortic stenosis   . Aortic insufficiency   . Hyperlipidemia   . Hypertension   . Sleep apnea   . CHF (congestive heart failure) (HCC)   . History of DVT (deep vein thrombosis)   . Diabetes mellitus type 2, uncontrolled (HCC)   . COPD (chronic obstructive pulmonary disease) (HCC)   . Cervical spondylolysis   . DDD (degenerative disc  disease), cervical   . Anemia   . Mitral valve disorder   . Anxiety   . Chronic gout   . Restless leg syndrome   . Osteoarthritis    Assessment: 81 yo female brought to ED from [facility] with respiratory distress and hyperkalemia on 11/26. Pharmacy was consulted then for vancomycin and Zosyn dosing for Sepsis.  UC today showing ESBL positive E.Coli. Pharmacy consulted to dose Ertapenem.   Plan:  Will start patient on 1g q24 hours. Pharmacy will continue to monitor renal function and make adjustments as needed.   Cher NakaiSheema Noboru Bidinger, PharmD Pharmacy Resident 12/08/2014 12:17 PM

## 2014-12-08 NOTE — Progress Notes (Signed)
Notify dr Luberta MutterKonidena via telephone that phosphorous level was 1.8, no potassium since 11/27 at 4.0, and magnesium today was normal, MD acknowledged.

## 2014-12-08 NOTE — Evaluation (Signed)
Clinical/Bedside Swallow Evaluation Patient Details  Name: Mindy Cook MRN: [id number] Date of Birth: 12-21-1933  Today's Date: 12/08/2014 Time: SLP Start Time (ACUTE ONLY): 1400 SLP Stop Time (ACUTE ONLY): 1430 SLP Time Calculation (min) (ACUTE ONLY): 30 min  Past Medical History:  Past Medical History  Diagnosis Date  . Asthma   . A-fib (HCC)   . Arthritis   . Aortic stenosis   . Aortic insufficiency   . Hyperlipidemia   . Hypertension   . Sleep apnea   . CHF (congestive heart failure) (HCC)   . History of DVT (deep vein thrombosis)   . Diabetes mellitus type 2, uncontrolled (HCC)   . COPD (chronic obstructive pulmonary disease) (HCC)   . Cervical spondylolysis   . DDD (degenerative disc disease), cervical   . Anemia   . Mitral valve disorder   . Anxiety   . Chronic gout   . Restless leg syndrome   . Osteoarthritis    Past Surgical History:  Past Surgical History  Procedure Laterality Date  . Abdominal hysterectomy    . Cardiac catheterization    . Colonoscopy     HPI:  Mindy Cook is a 81 y.o. female with a known history of atrial fibrillation, asthma, hypertension, hyperlipidemia, history of DVT, bedridden status with ambulatory difficulties comes in from of [facility] secondary to lethargy and hypoxia. Oxygen saturation 76% on 2 L of oxygen. Patient is on chronically 2 L of oxygen. Last night she became hypoxic sats were in low 70s and the staff found her. Because of that she was brought in here. Right now she is 100% nonrebreather saturations are 100%. Lethargic and unable to answer questions. She had fever 101 Fahrenheit in the emergency room.    Assessment / Plan / Recommendation Clinical Impression  Pt presents with oral pharyngeal phase dysphagia c/b reduced coordination in receiving spoon trials and reduced labial seal; Pt also demonstrated overt s/s of aspiration when receiving 1/2 tsp amount of water by spoon.  Thin trials discontinued after ST noted  immediate cough and throat clear on second of two trials of thin liquids by spoon. Pt did not demonstrate overt or immediate s/s of aspiration on trials of puree and honey thick liquids. Pt refused ice chips and did not consistently coorporate with ST. Given presentation and limited nature of assessment, Pt would benefit from a conservative diet approach to limit aspiration risk of purees with honey thick liquids by tsp. NSG and MD updated and agree with approach. ST will f/u with diet toleration. Strict aspiration precautions and meds in puree, crushed as able.    Aspiration Risk  Mild aspiration risk (due to cognitive status presentation and hx respiratory issu)    Diet Recommendation  Puree with honey (strict aspiration precautions)  Medication Administration: Crushed with puree (as able)    Other  Recommendations Oral Care Recommendations: Oral care BID;Staff/trained caregiver to provide oral care   Follow up Recommendations   (TBD)    Frequency and Duration min 3x week  1 week       Prognosis Prognosis for Safe Diet Advancement: Guarded Barriers to Reach Goals: Behavior;Motivation      Swallow Study   General Date of Onset: 12/06/14 HPI: Mindy Cook is a 81 y.o. female with a known history of atrial fibrillation, asthma, hypertension, hyperlipidemia, history of DVT, bedridden status with ambulatory difficulties comes in from of [facility] secondary to lethargy and hypoxia. Oxygen saturation 76% on 2 L of oxygen. Patient is on chronically  2 L of oxygen. Last night she became hypoxic sats were in low 70s and the staff found her. Because of that she was brought in here. Right now she is 100% nonrebreather saturations are 100%. Lethargic and unable to answer questions. She had fever 101 Fahrenheit in the emergency room.  Type of Study: Bedside Swallow Evaluation Diet Prior to this Study:  (unknown) Temperature Spikes Noted: Yes Respiratory Status: Nasal cannula (3 liters) History of  Recent Intubation: No Behavior/Cognition: Confused;Agitated;Uncooperative;Requires cueing Oral Cavity Assessment:  (UTA) Oral Care Completed by SLP: No Oral Cavity - Dentition: Missing dentition (no top dentition) Self-Feeding Abilities: Total assist Patient Positioning: Upright in bed Baseline Vocal Quality: Low vocal intensity Volitional Cough: Cognitively unable to elicit    Oral/Motor/Sensory Function Overall Oral Motor/Sensory Function:  (UTA)   Ice Chips Ice chips: Not tested Other Comments: Pt refused ice chip trials - UTA   Thin Liquid Thin Liquid: Impaired Presentation: Spoon Pharyngeal  Phase Impairments: Throat Clearing - Immediate;Cough - Immediate Other Comments: Pt coughed and throat cleared after second of 2 trials of water by 1/2 tsp. ST discontinued water trials.    Nectar Thick Nectar Thick Liquid: Not tested   Honey Thick Honey Thick Liquid: Impaired Presentation: Spoon Oral Phase Impairments: Reduced labial seal (poor oral coordination to receive spoon ) Other Comments: Pt consumed 7 trials of honey with no overt or immediate s/s of aspiration. min oral phase deficits noted in coordination of receiving spoon and reduced labial seal.   Puree Puree: Impaired Presentation: Spoon Oral Phase Impairments: Reduced labial seal Other Comments: Pt consumed 5 trials of puree with no overt or immediate s/s of aspiration. min oral phase deficits noted in coordination of receiving spoon and reduced labial seal.   Solid Solid: Not tested       Mindy Cook 12/08/2014,2:48 PM

## 2014-12-08 NOTE — Evaluation (Signed)
Physical Therapy Evaluation Patient Details Name: Mindy Cook MRN: [id number] DOB: 01-06-1934 Today's Date: 12/08/2014   History of Present Illness  presented to ER secondary to increased lethargy, decreased O2 sats (70s upon arrival) and fever; admitted with AMS, respiratory distress related to sepsis secondary to UTI, PNA.  Clinical Impression  Upon evaluation, patient alert, but largely non-verbal (responding only "please don't" when therapist replaced hand mits at end of session).  Does grunt some yes/no answers, but question accuracy.  Visually tracks and acknowledges therapist, but displays limited active command following and comprehension of desired tasks.  Bilat UE/LE strength generally weak, at least 2+ to 3-/5; patient physically resistant to movement at times, but does not indicate pain (likely due to lack of understanding of task).  Currently dep for all attempts at repositioning in bed and total assist/dep +2 for any attempted bed mobility. Unable/unsafe to attempt unsupported sitting or OOB at this time. Would benefit from skilled PT to address above deficits and promote optimal return to PLOF; recommend transition to STR upon discharge from acute hospitalization, provided patient improves in mental status and ability to actively participate with skilled interventions.     Follow Up Recommendations SNF (if patient able to actively participate/progress with skilled interventions)    Equipment Recommendations       Recommendations for Other Services       Precautions / Restrictions Precautions Precautions: Fall Restrictions Weight Bearing Restrictions: No      Mobility  Bed Mobility               General bed mobility comments: anticipate total assist; patient resistant to efforts at repositioning/rolling this date  Transfers                 General transfer comment: unsafe/unable  Ambulation/Gait             General Gait Details:  unsafe/unable  Stairs            Wheelchair Mobility    Modified Rankin (Stroke Patients Only)       Balance                                             Pertinent Vitals/Pain Pain Assessment: Faces Pain Score: 0-No pain Pain Location: no clinical indicators of pain    Home Living Family/patient expects to be discharged to:: Other (Comment) (patient unable to verbalize)                      Prior Function           Comments: Per chart, patient resident of ALF ([facility]), limited OOB mobility in recent weeks/months.  Will obtain additional information as family/facility available.     Hand Dominance        Extremity/Trunk Assessment   Upper Extremity Assessment: Generalized weakness (act assist for all extremity movement, strength at least 3-/5)           Lower Extremity Assessment: Generalized weakness (act assist for all extremity movement, strength at least 2+ to 3-/5; somewhat resistant to bilat hip flex, but tolerating to at least 70-80 degrees; bilat ankles to neutral)         Communication      Cognition Arousal/Alertness: Awake/alert Behavior During Therapy: Restless Overall Cognitive Status: Difficult to assess (patient with limited communication, command-following this date)  General Comments      Exercises        Assessment/Plan    PT Assessment Patient needs continued PT services  PT Diagnosis Difficulty walking;Generalized weakness;Altered mental status   PT Problem List Decreased strength;Decreased range of motion;Decreased activity tolerance;Decreased balance;Decreased mobility;Decreased cognition;Decreased knowledge of use of DME;Decreased safety awareness;Decreased knowledge of precautions;Cardiopulmonary status limiting activity  PT Treatment Interventions DME instruction;Gait training;Functional mobility training;Therapeutic activities;Therapeutic exercise;Balance  training;Cognitive remediation;Patient/family education   PT Goals (Current goals can be found in the Care Plan section) Acute Rehab PT Goals PT Goal Formulation: Patient unable to participate in goal setting Time For Goal Achievement: 12/22/14 Potential to Achieve Goals: Fair Additional Goals Additional Goal #1: Assess and establish goals for OOB activities as appropriate.    Frequency Min 2X/week   Barriers to discharge Decreased caregiver support      Co-evaluation               End of Session   Activity Tolerance:  (limited by restlessness, impaired command-following and active participation ) Patient left: in bed;with call bell/phone within reach;with bed alarm set           Time: [id number] PT Time Calculation (min) (ACUTE ONLY): 11 min   Charges:   PT Evaluation $Initial PT Evaluation Tier I: 1 Procedure     PT G Codes:       Login Muckleroy H. Manson PasseyBrown, PT, DPT, NCS 12/08/2014, 9:08 AM [phone]

## 2014-12-09 DIAGNOSIS — J9622 Acute and chronic respiratory failure with hypercapnia: Secondary | ICD-10-CM

## 2014-12-09 DIAGNOSIS — J9621 Acute and chronic respiratory failure with hypoxia: Secondary | ICD-10-CM

## 2014-12-09 DIAGNOSIS — A419 Sepsis, unspecified organism: Principal | ICD-10-CM

## 2014-12-09 LAB — GLUCOSE, CAPILLARY
GLUCOSE-CAPILLARY: 130 mg/dL — AB (ref 65–99)
Glucose-Capillary: 174 mg/dL — ABNORMAL HIGH (ref 65–99)
Glucose-Capillary: 159 mg/dL — ABNORMAL HIGH (ref 65–99)

## 2014-12-09 LAB — BASIC METABOLIC PANEL
ANION GAP: 9 (ref 5–15)
BUN: 25 mg/dL — ABNORMAL HIGH (ref 6–20)
CO2: 38 mmol/L — AB (ref 22–32)
Calcium: 9.8 mg/dL (ref 8.9–10.3)
Chloride: 97 mmol/L — ABNORMAL LOW (ref 101–111)
Creatinine, Ser: 0.72 mg/dL (ref 0.44–1.00)
GFR calc non Af Amer: >60 mL/min (ref >60)
GLUCOSE: 153 mg/dL — AB (ref 65–99)
POTASSIUM: 2.6 mmol/L — AB (ref 3.5–5.1)
Sodium: 144 mmol/L (ref 135–145)

## 2014-12-09 LAB — BLOOD GAS, ARTERIAL
ACID-BASE EXCESS: 13.8 mmol/L — AB (ref 0.0–3.0)
Allens test (pass/fail): POSITIVE — AB
BICARBONATE: 41.5 meq/L — AB (ref 21.0–28.0)
FIO2: 0.44
O2 Saturation: 98.5 %
PCO2 ART: 67 mmHg — AB (ref 32.0–48.0)
PH ART: 7.4 (ref 7.350–7.450)
Patient temperature: 37
pO2, Arterial: 115 mmHg — ABNORMAL HIGH (ref 83.0–108.0)

## 2014-12-09 MED ORDER — POTASSIUM PHOSPHATES 15 MMOLE/5ML IV SOLN
15.0000 mmol | Freq: Once | INTRAVENOUS | Status: AC
  Administered 2014-12-09: 15 mmol via INTRAVENOUS
  Filled 2014-12-09 (×2): qty 5

## 2014-12-09 MED ORDER — METOLAZONE 5 MG PO TABS
5.0000 mg | ORAL_TABLET | ORAL | Status: DC
  Administered 2014-12-10 – 2014-12-12 (×2): 5 mg via ORAL
  Filled 2014-12-09: qty 2
  Filled 2014-12-09 (×3): qty 1

## 2014-12-09 MED ORDER — DOXYCYCLINE HYCLATE 100 MG IV SOLR
100.0000 mg | Freq: Two times a day (BID) | INTRAVENOUS | Status: DC
  Administered 2014-12-10 – 2014-12-13 (×8): 100 mg via INTRAVENOUS
  Filled 2014-12-09 (×10): qty 100

## 2014-12-09 MED ORDER — DOXYCYCLINE HYCLATE 100 MG PO TABS: 100.0000 mg | ORAL_TABLET | Freq: Two times a day (BID) | ORAL | Status: DC

## 2014-12-09 MED ORDER — DIGOXIN 125 MCG PO TABS
0.0625 mg | ORAL_TABLET | Freq: Every day | ORAL | Status: DC
  Administered 2014-12-09: 13:00:00 0.0625 mg via ORAL
  Filled 2014-12-09 (×2): qty 1

## 2014-12-09 NOTE — Progress Notes (Signed)
[facility] [place] [facility]   PATIENT NAME: Mindy Cook    MR#:  [id number]  DATE OF BIRTH:  24-Nov-1933  SUBJECTIVE: Admitted for altered mental status and respiratory distress. Found to have UTI and sepsis. Isolation secondary to ESBL UTI. Confused. According to the nurse this morning she was able to communicate but continues to have poor by mouth intake.Marland Kitchen   CHIEF COMPLAINT:   Chief Complaint  Patient presents with  . Respiratory Distress    REVIEW OF SYSTEMS:   Review of Systems  Unable to perform ROS: medical condition     Allergies  Allergen Reactions  . Hydrocodone-Acetaminophen Nausea Only    VITALS:  Blood pressure 173/102, pulse 66, temperature 97.8 F (36.6 C), temperature source Oral, resp. rate 36, height  (1.6 m), weight 81.965 kg (180 lb 11.2 oz), SpO2 80 %.  PHYSICAL EXAMINATION:  GENERAL:  81 y.o.-year-old patient lying in the bed with no acute distress.  EYES: Pupils equal, round, reactive to light and accommodation. No scleral icterus. Extraocular muscles intact.  HEENT: Head atraumatic, normocephalic. Oropharynx and nasopharynx clear.  NECK:  Supple, no jugular venous distention. No thyroid enlargement, no tenderness.  LUNGS: Normal breath sounds bilaterally, no wheezing, rales,rhonchi or crepitation. No use of accessory muscles of respiration.  CARDIOVASCULAR: S1, S2 normal. No murmurs, rubs, or gallops.  ABDOMEN: Soft, nontender, nondistended. Bowel sounds present. No organomegaly or mass.  EXTREMITIES: No pedal edema, cyanosis, or clubbing.  NEUROLOGIC: Cranial nerves II through XII are intact. Muscle strength 5/5 in all extremities. Sensation intact. Gait not checked.  PSYCHIATRIC: Confused  SKIN: No obvious rash, lesion, or ulcer.    LABORATORY PANEL:   CBC  Recent Labs Lab 12/07/14 0532  WBC 8.8  HGB 10.1*  HCT 31.5*  PLT 105*    ------------------------------------------------------------------------------------------------------------------  Chemistries   Recent Labs Lab 12/06/14 0702  12/07/14 0532 12/08/14 1307  NA 139  --  142  --   K 6.0*  < > 4.0  --   CL 92*  --  100*  --   CO2 44*  --  36*  --   GLUCOSE 287*  --  170*  --   BUN 29*  --  21*  --   CREATININE 0.94  --  0.71  --   CALCIUM 10.7*  --  10.1  --   MG  --   --   --  1.9  AST 18  --   --   --   ALT 13*  --   --   --   ALKPHOS 59  --   --   --   BILITOT 0.7  --   --   --   < > = values in this interval not displayed. ------------------------------------------------------------------------------------------------------------------  Cardiac Enzymes  Recent Labs Lab 12/06/14 0702  TROPONINI 0.04*   ------------------------------------------------------------------------------------------------------------------  RADIOLOGY:  Dg Chest 1 View  12/07/2014  CLINICAL DATA:  Hypoxia and confusion EXAM: CHEST 1 VIEW COMPARISON:  12/06/2014 FINDINGS: Stable enlarged cardiac silhouette. The central perihilar opacities are not changed. Mild linear interstitial opacities more peripherally. Small amount of fluid along the new RIGHT horizontal fissure is increased. No focal consolidation. No pneumothorax. IMPRESSION: Findings suggest pulmonary edema and mild interstitial edema. No significant change from prior. Electronically Signed   By: Genevive Bi M.D.   On: 12/07/2014 15:59   Dg Abd 1 View  12/08/2014  CLINICAL DATA:  Urinary tract infection and sepsis. EXAM: ABDOMEN -  1 VIEW COMPARISON:  08/16/2014 FINDINGS: Evidence for a cardiac TAVR procedure. Gas and stool in the abdomen. Nonobstructive bowel gas pattern. Sclerosis of the pubic symphysis. Cannot exclude densities at the left lung base. IMPRESSION: Nonobstructive bowel gas pattern. Probable left basilar chest densities. Electronically Signed   By: Richarda OverlieAdam  Henn M.D.   On: 12/08/2014 15:07    Ct Head Wo Contrast  12/07/2014  CLINICAL DATA:  Altered mental status today. No known injury. Initial encounter. EXAM: CT HEAD WITHOUT CONTRAST TECHNIQUE: Contiguous axial images were obtained from the base of the skull through the vertex without intravenous contrast. COMPARISON:  Head CT scan 08/28/2014. FINDINGS: Cortical atrophy is again seen. There is no evidence of acute intracranial abnormality including hemorrhage, infarct, mass lesion, mass effect, midline shift or abnormal extra-axial fluid collection. No hydrocephalus or pneumocephalus. The calvarium is intact. Carotid atherosclerosis is noted. Imaged paranasal sinuses and mastoid air cells are clear. IMPRESSION: No acute abnormality. Atrophy. Atherosclerosis. Electronically Signed   By: Drusilla Kannerhomas  Dalessio M.D.   On: 12/07/2014 15:34    EKG:   Orders placed or performed during the hospital encounter of 09/16/14  . EKG 12-Lead  . EKG 12-Lead  . ED EKG  . ED EKG  . EKG    ASSESSMENT AND PLAN:  1. Sepsis ESBL ZOX:WRUEAVWUTI:continue ertapenam  #2 metabolic encephalopathy secondary to sepsis: Resolved now back to baseline. Started on pured diet by speech. Po intake is poor.Marland Kitchen.avoid narcotics.  3. Acute hypoxia secondary to possible pneumonia: /Pulmonary edema by x-ray: Continue IV antibiotics, Lasix.  4.hyperkalemia; resolved. Hold the by mouth potassium supplements.  #5 dementia bed bound status: Continue Xanax ,  #Diabetes mellitus type 2 hold Amaryl use sliding scale with coverage only.  #6 chronic diastolic heart failure;  restart the Zaroxolyn in, Lasix  7.History of COPD: Patient has no wheezing at this time. Use nebulizers when necessary. At 8.bradycardia;resolved. #9 delirium secondary to ESBL UTI, baseline dementia. CT head unremarkable.  Prognosis poor. All the records are reviewed and case discussed with Care Management/Social Workerr. Management plans discussed with the patient, family and they are in agreement.  CODE  STATUS:full  TOTAL TIME TAKING CARE OF THIS PATIENT: 35 minutes.  Critical care time  POSSIBLE D/C IN 1-2  DAYS, DEPENDING ON CLINICAL CONDITION.   Katha HammingKONIDENA,Anderson Coppock M.D on 12/09/2014 at 11:48 AM  Between 7am to 6pm - Pager - [phone]  After 6pm go to [email or web address] - password [email or web address] [facility]  [facility]  Office  [phone]  CC: Primary care physician; Patrice ParadiseMCLAUGHLIN, [name], MD   Note: This dictation was prepared with Dragon dictation along with smaller phrase technology. Any transcriptional errors that result from this process are unintentional.

## 2014-12-09 NOTE — Progress Notes (Signed)
PT Cancellation Note  Patient Details Name: Mindy GuysShelby J Ding MRN: [id number] DOB: 1933-07-12   Cancelled Treatment:    Reason Eval/Treat Not Completed: Patient declined, no reason specified. Treatment attempted, nursing in room as well. Pt refuses PT adamantly shaking head "no". According to chart review and discussion with nursing, pt has been refusing to eat and medications as well. Will attempt PT as appropriate tomorrow as schedule allows.    Elsie StainHeidi Elizabeth Bishop 12/09/2014, 11:35 AM

## 2014-12-09 NOTE — Consult Note (Signed)
[facility] Critical Care Medicine Consultation     ASSESSMENT/PLAN    PULMONARY  A: Acute hypoxic respiratory failure with chronic hypercapnic respiratory failure.  -Chronic hypoxic respiratory failure on 2-3L oxygen at home.  -Pneumonia with sepsis.   P:   Given her metabolic encephalopathy, delirium, and dysphagia, she is at risk of aspiration pneumonia.  --Will start empiric treatment for aspiration pneumonia.  --Had a discussion with daughter regarding code status. She will remain full code, she is going to discuss further with her older brother regarding code status.    CARDIOVASCULAR  A:D-CHF, stable.  Afib, s/p RVR, now controlled.  P:    RENAL A:  Stable.   GASTROINTESTINAL A:  Dysphagia.  P: Dysphagia.   HEMATOLOGIC -  INFECTIOUS A:  Possible aspiration pneumonia.  Will treat with abx.   ENDOCRINE A:    NEUROLOGIC A:  Metabolic encephalopathy. Delirium.  --Stable.      ---------------------------------------  ---------------------------------------   Name: Mindy Cook MRN: [id number] DOB: [birth year]    ADMISSION DATE:  12/06/2014 CONSULTATION DATE:  12/09/14  REFERRING MD :  Dr. Luberta Mutter.   CHIEF COMPLAINT:  Dyspnea.    HISTORY OF PRESENT ILLNESS:   Pt initially admitted on 12/06/14 for acute with sepsis due to UTI, ESBL, complicated by encephalopahty, she also has DM, D-CHF, delirium, COPD. She had a CT head which was unremarkable. She was seen by speech and was noted to have pharyngeal dysphagia, on 11/28; she was recommended to have pureed diet with honey thick liquids. She was initially in the ICU due to AFIB with RVR. Per E-link note the patient is DNR.  She has apparently had persistent AMS with minimal interaction. She was noted to have 10 beat run of Vtach.  She was transferred to floor yeasterday on 3L oxygen. Since that time she had a decline in her mental status, she has been placed on bipap at 35%, then her was placed on  oxygen at 6L.   I discussed the case with pts daughter at bedside. She was living at home until about 3 months ago, she had experienced multiple falls and was admitted to the hospital. She was discharged to rehab but did not make any progress, therefore was transferred to [facility]. She apparently had another admission and then went back to [facility] rehab, but did not make progress so was transferred to their NH. She is completely dependent on others for care and can not ambulate. Her daughter states that she thinks she would want to be be save with life support but would not want to be maintained on it for long periods of time.   Per H &P:  Mindy Cook is a 81 y.o. female with a known history of atrial fibrillation, asthma, hypertension, hyperlipidemia, history of DVT, bedridden status with ambulatory difficulties comes in from of [place] secondary to lethargy and hypoxia. Oxygen saturation 76% on 2 L of oxygen. Patient is on chronically 2 L of oxygen. Last night she became hypoxic sats were in low 70s and the staff found her.  PAST MEDICAL HISTORY :  Past Medical History  Diagnosis Date  . Asthma   . A-fib (HCC)   . Arthritis   . Aortic stenosis   . Aortic insufficiency   . Hyperlipidemia   . Hypertension   . Sleep apnea   . CHF (congestive heart failure) (HCC)   . History of DVT (deep vein thrombosis)   . Diabetes mellitus type 2, uncontrolled (HCC)   .  COPD (chronic obstructive pulmonary disease) (HCC)   . Cervical spondylolysis   . DDD (degenerative disc disease), cervical   . Anemia   . Mitral valve disorder   . Anxiety   . Chronic gout   . Restless leg syndrome   . Osteoarthritis    Past Surgical History  Procedure Laterality Date  . Abdominal hysterectomy    . Cardiac catheterization    . Colonoscopy     Prior to Admission medications   Medication Sig Start Date End Date Taking? Authorizing Provider  acetaminophen (TYLENOL) 325 MG tablet Take 650 mg by mouth See  admin instructions. Take 2 tablets (650mg ) orally four times a day. (scheduled) Take 2 tablets (650mg ) orally every 4 hours as needed for pain and/or increased temperature. (prn)   Yes Historical Provider, MD  acidophilus (RISAQUAD) CAPS capsule Take 1 capsule by mouth 2 (two) times daily.   Yes Historical Provider, MD  allopurinol (ZYLOPRIM) 100 MG tablet Take 100 mg by mouth daily.   Yes Historical Provider, MD  ALPRAZolam (XANAX) 0.25 MG tablet Take 1 tablet (0.25 mg total) by mouth every evening. Patient taking differently: Take 0.25 mg by mouth See admin instructions. Take 1 tablet orally every evening for anxiety. (scheduled) and  Take 1 tablet orally every 6 hours as needed for anxiety. (prn) 09/01/14  Yes Katharina Caperima Vaickute, MD  azelastine (ASTELIN) 0.1 % nasal spray Place 1 spray into both nostrils 2 (two) times daily.   Yes Historical Provider, MD  bisacodyl (DULCOLAX) 10 MG suppository Place 10 mg rectally daily as needed for mild constipation, moderate constipation or severe constipation.   Yes Historical Provider, MD  carvedilol (COREG) 3.125 MG tablet Take 3.125 mg by mouth 2 (two) times daily.   Yes Historical Provider, MD  clotrimazole (LOTRIMIN) 1 % cream Apply 1 application topically 2 (two) times daily. Pt mixes with zinc oxide cream.   Yes Historical Provider, MD  Cranberry 250 MG TABS Take 500 mg by mouth daily.   Yes Historical Provider, MD  cyclobenzaprine (FLEXERIL) 5 MG tablet Take 5 mg by mouth 3 (three) times daily as needed for muscle spasms.   Yes Historical Provider, MD  digoxin (LANOXIN) 0.125 MG tablet Take 0.0625 mg by mouth daily.   Yes Historical Provider, MD  ferrous sulfate 325 (65 FE) MG tablet Take 325 mg by mouth daily.   Yes Historical Provider, MD  Fluticasone-Salmeterol (ADVAIR) 250-50 MCG/DOSE AEPB Inhale 1 puff into the lungs every 12 (twelve) hours.    Yes Historical Provider, MD  furosemide (LASIX) 20 MG tablet Take 20 mg by mouth daily.   Yes Historical  Provider, MD  gabapentin (NEURONTIN) 300 MG capsule Take 1 capsule (300 mg total) by mouth at bedtime. Patient taking differently: Take 300-600 mg by mouth See admin instructions. Take 2 capsules (600mg ) orally 3 times a day and take 1 capsule orally once a day at bedtime. 09/18/14  Yes Altamese DillingVaibhavkumar Vachhani, MD  glimepiride (AMARYL) 1 MG tablet Take 1 mg by mouth 2 (two) times daily.   Yes Historical Provider, MD  insulin regular (NOVOLIN R,HUMULIN R) 100 units/mL injection Inject into the skin See admin instructions. Inject per sliding scale subcutaneous before meals and at bedtime as needed. SS: If bs is 81 to 175=give 0 units, 176 to 250=give 3 units, 251 to 325=give 6 units, 326 to 450=10 units, greater than 450=give 12 units, greater than or equal to 451=give 12 units and recheck in 1 hour, if greater than and or  equal to 451 on 2 consecutive occasions, Call MD.   Yes Historical Provider, MD  ipratropium-albuterol (DUONEB) 0.5-2.5 (3) MG/3ML SOLN Take 3 mLs by nebulization every 4 (four) hours as needed. Patient taking differently: Take 3 mLs by nebulization every 4 (four) hours as needed (for shortness of breath.).  08/31/14  Yes Katharina Caper, MD  magnesium hydroxide (MILK OF MAGNESIA) 400 MG/5ML suspension Take 30 mLs by mouth daily as needed for mild constipation or moderate constipation.   Yes Historical Provider, MD  meclizine (ANTIVERT) 12.5 MG tablet Take 12.5 mg by mouth 3 (three) times daily as needed for dizziness.   Yes Historical Provider, MD  metolazone (ZAROXOLYN) 5 MG tablet Take 5 mg by mouth 3 (three) times a week. Pt takes on Monday, Thursday, and Sunday.   Yes Historical Provider, MD  mirabegron ER (MYRBETRIQ) 50 MG TB24 tablet Take 1 tablet (50 mg total) by mouth daily. 07/22/14  Yes Crist Fat, MD  mometasone (NASONEX) 50 MCG/ACT nasal spray Place 1 spray into the nose daily.    Yes Historical Provider, MD  montelukast (SINGULAIR) 10 MG tablet Take 10 mg by mouth at  bedtime.    Yes Historical Provider, MD  nitrofurantoin (MACRODANTIN) 50 MG capsule Take 1 capsule (50 mg total) by mouth at bedtime. 07/22/14  Yes Crist Fat, MD  ondansetron (ZOFRAN) 4 MG tablet Take 4 mg by mouth every 6 (six) hours as needed for nausea or vomiting.   Yes Historical Provider, MD  polyethylene glycol (MIRALAX / GLYCOLAX) packet Take 17 g by mouth daily. 09/01/14  Yes Katharina Caper, MD  potassium chloride SA (K-DUR,KLOR-CON) 20 MEQ tablet Take 20 mEq by mouth 2 (two) times daily.   Yes Historical Provider, MD  pravastatin (PRAVACHOL) 40 MG tablet Take 40 mg by mouth at bedtime.   Yes Historical Provider, MD  promethazine (PHENERGAN) 25 MG/ML injection Inject 25 mg into the vein every 4 (four) hours as needed for nausea or vomiting.   Yes Historical Provider, MD  rOPINIRole (REQUIP) 0.25 MG tablet Take 0.25 mg by mouth at bedtime.   Yes Historical Provider, MD  sennosides-docusate sodium (SENOKOT-S) 8.6-50 MG tablet Take 1 tablet by mouth 2 (two) times daily.   Yes Historical Provider, MD  sitaGLIPtin (JANUVIA) 100 MG tablet Take 100 mg by mouth daily.   Yes Historical Provider, MD  traMADol (ULTRAM) 50 MG tablet Take 1 tablet (50 mg total) by mouth every 6 (six) hours as needed for moderate pain. 08/31/14  Yes Katharina Caper, MD  traZODone (DESYREL) 50 MG tablet Take 50 mg by mouth at bedtime as needed for sleep (for insomnia).   Yes Historical Provider, MD  Trolamine Salicylate (ASPERCREME) 10 % LOTN Apply 1 application topically 4 (four) times daily. Apply a thin film to both ankles/top of feet and any other painful areas.   Yes Historical Provider, MD  ZINC OXIDE, TOPICAL, 10 % CREA Apply 1 application topically 2 (two) times daily. Pt mixes with Clotrimazole cream.   Yes Historical Provider, MD   Allergies  Allergen Reactions  . Hydrocodone-Acetaminophen Nausea Only    FAMILY HISTORY:  Family History  Problem Relation Age of Onset  . Heart failure Mother   . Cancer  Father   . Heart disease Father   . Breast cancer Sister   . Fibromyalgia Sister    SOCIAL HISTORY:  reports that she has never smoked. She does not have any smokeless tobacco history on file. She reports that she does  not drink alcohol or use illicit drugs.  REVIEW OF SYSTEMS:   Constitutional: Feels well. Cardiovascular: No chest pain.  Pulmonary: Denies dyspnea.   The remainder of systems were reviewed and were found to be negative other than what is documented in the HPI.    VITAL SIGNS: Temp:  [97.5 F (36.4 C)-97.8 F (36.6 C)] 97.5 F (36.4 C) (11/29 1506) Pulse Rate:  [60-91] 67 (11/29 1506) Resp:  [18-36] 21 (11/29 1506) BP: (151-179)/(67-102) 151/67 mmHg (11/29 1506) SpO2:  [80 %-98 %] 98 % (11/29 1506) FiO2 (%):  [35 %] 35 % (11/29 1227) HEMODYNAMICS:   VENTILATOR SETTINGS: Vent Mode:  [-]  FiO2 (%):  [35 %] 35 % INTAKE / OUTPUT:  Intake/Output Summary (Last 24 hours) at 12/09/14 1558 Last data filed at 12/09/14 0745  Gross per 24 hour  Intake   1387 ml  Output      0 ml  Net   1387 ml    Physical Examination:   VS: BP 151/67 mmHg  Pulse 67  Temp(Src) 97.5 F (36.4 C) (Oral)  Resp 21  Ht  (1.6 m)  Wt 81.965 kg (180 lb 11.2 oz)  BMI 32.02 kg/m2  SpO2 98%  General Appearance: No distress  Neuro:without focal findings,  intact,  HEENT: PERRLA, EOM intact, no ptosis, no other lesions noticed;  Pulmonary: normal breath sounds., diaphragmatic excursion normal. CardiovascularNormal S1,S2.  No m/r/g.    Abdomen: Benign, Soft, non-tender, No masses, hepatosplenomegaly,  Renal:  No costovertebral tenderness  GU:  Not performed at this time. Endoc: No evident thyromegaly, no signs of acromegaly. Skin:   warm, no rashes, no ecchymosis  Extremities: normal, no cyanosis, clubbing, no edema, warm with reduced capillary refill.    LABS: Reviewed   LABORATORY PANEL:   CBC  Recent Labs Lab 12/07/14 0532  WBC 8.8  HGB 10.1*  HCT 31.5*  PLT  105*    Chemistries   Recent Labs Lab 12/06/14 0702  12/08/14 1307 12/09/14 1257  NA 139  < >  --  144  K 6.0*  < >  --  2.6*  CL 92*  < >  --  97*  CO2 44*  < >  --  38*  GLUCOSE 287*  < >  --  153*  BUN 29*  < >  --  25*  CREATININE 0.94  < >  --  0.72  CALCIUM 10.7*  < >  --  9.8  MG  --   --  1.9  --   PHOS  --   --  1.8*  --   AST 18  --   --   --   ALT 13*  --   --   --   ALKPHOS 59  --   --   --   BILITOT 0.7  --   --   --   < > = values in this interval not displayed.   Recent Labs Lab 12/08/14 0821 12/08/14 1138 12/08/14 1619 12/08/14 2104 12/09/14 0710 12/09/14 1115  GLUCAP 197* 205* 148* 183* 174* 159*    Recent Labs Lab 12/08/14 1600 12/09/14 1435  PHART 7.50* 7.40  PCO2ART 53* 67*  PO2ART 72* 115*    Recent Labs Lab 12/06/14 0702  AST 18  ALT 13*  ALKPHOS 59  BILITOT 0.7  ALBUMIN 3.9    Cardiac Enzymes  Recent Labs Lab 12/06/14 0702  TROPONINI 0.04*    RADIOLOGY:  Dg Abd 1 View  12/08/2014  CLINICAL DATA:  Urinary tract infection and sepsis. EXAM: ABDOMEN - 1 VIEW COMPARISON:  08/16/2014 FINDINGS: Evidence for a cardiac TAVR procedure. Gas and stool in the abdomen. Nonobstructive bowel gas pattern. Sclerosis of the pubic symphysis. Cannot exclude densities at the left lung base. IMPRESSION: Nonobstructive bowel gas pattern. Probable left basilar chest densities. Electronically Signed   By: Richarda Overlie M.D.   On: 12/08/2014 15:07       --Deep Nicholos Johns, MD.  Board Certified in Internal Medicine, Pulmonary Medicine, Critical Care Medicine, and Sleep Medicine.   [facility]   Santiago Glad, M.D.  Stephanie Acre, M.D.  Billy Fischer, M.D   12/09/2014, 3:58 PM

## 2014-12-09 NOTE — Progress Notes (Signed)
[facility] - [place] at [facility]   PATIENT NAME: Mindy Cook    MR#:  [id number]  DATE OF BIRTH:  [birth year]  SUBJECTIVE: Admitted for altered mental status and respiratory distress. Found to have UTI and sepsis. Isolation secondary to ESBL UTI. Confused. According to the nurse this morning she was able to communicate but continues to have poor by mouth intake.Marland Kitchen   CHIEF COMPLAINT:   Chief Complaint  Patient presents with  . Respiratory Distress    REVIEW OF SYSTEMS:   Review of Systems  Unable to perform ROS: medical condition     Allergies  Allergen Reactions  . Hydrocodone-Acetaminophen Nausea Only    VITALS:  Blood pressure 140/69, pulse 79, temperature 98.4 F (36.9 C), temperature source Oral, resp. rate 24, height  (1.6 m), weight 81.8 kg (180 lb 5.4 oz), SpO2 100 %.  PHYSICAL EXAMINATION:  GENERAL:  81 y.o.-year-old patient lying in the bed with no acute distress.  EYES: Pupils equal, round, reactive to light and accommodation. No scleral icterus. Extraocular muscles intact.  HEENT: Head atraumatic, normocephalic. Oropharynx and nasopharynx clear.  NECK:  Supple, no jugular venous distention. No thyroid enlargement, no tenderness.  LUNGS: Normal breath sounds bilaterally, no wheezing, rales,rhonchi or crepitation. No use of accessory muscles of respiration.  CARDIOVASCULAR: S1, S2 normal. No murmurs, rubs, or gallops.  ABDOMEN: Soft, nontender, nondistended. Bowel sounds present. No organomegaly or mass.  EXTREMITIES: No pedal edema, cyanosis, or clubbing.  NEUROLOGIC: Cranial nerves II through XII are intact. Muscle strength 5/5 in all extremities. Sensation intact. Gait not checked.  PSYCHIATRIC: Confused  SKIN: No obvious rash, lesion, or ulcer.    LABORATORY PANEL:   CBC  Recent Labs Lab 12/07/14 0532  WBC 8.8  HGB 10.1*  HCT 31.5*  PLT 105*    ------------------------------------------------------------------------------------------------------------------  Chemistries   Recent Labs Lab 12/06/14 0702  12/08/14 1307 12/09/14 1257  NA 139  < >  --  144  K 6.0*  < >  --  2.6*  CL 92*  < >  --  97*  CO2 44*  < >  --  38*  GLUCOSE 287*  < >  --  153*  BUN 29*  < >  --  25*  CREATININE 0.94  < >  --  0.72  CALCIUM 10.7*  < >  --  9.8  MG  --   --  1.9  --   AST 18  --   --   --   ALT 13*  --   --   --   ALKPHOS 59  --   --   --   BILITOT 0.7  --   --   --   < > = values in this interval not displayed. ------------------------------------------------------------------------------------------------------------------  Cardiac Enzymes  Recent Labs Lab 12/06/14 0702  TROPONINI 0.04*   ------------------------------------------------------------------------------------------------------------------  RADIOLOGY:  Dg Abd 1 View  12/08/2014  CLINICAL DATA:  Urinary tract infection and sepsis. EXAM: ABDOMEN - 1 VIEW COMPARISON:  08/16/2014 FINDINGS: Evidence for a cardiac TAVR procedure. Gas and stool in the abdomen. Nonobstructive bowel gas pattern. Sclerosis of the pubic symphysis. Cannot exclude densities at the left lung base. IMPRESSION: Nonobstructive bowel gas pattern. Probable left basilar chest densities. Electronically Signed   By: Richarda Overlie M.D.   On: 12/08/2014 15:07    EKG:   Orders placed or performed during the hospital encounter of 09/16/14  . EKG 12-Lead  . EKG 12-Lead  .  ED EKG  . ED EKG  . EKG    ASSESSMENT AND PLAN:  1. Sepsis ESBL YNW:GNFAOZHYTI:continue ertapenam  #2 metabolic encephalopathy secondary to sepsis: Resolved now back to baseline. Started on pured diet by speech. Po intake is poor.Marland Kitchen.avoid narcotics.  3. Acute hypoxia secondary to possible pneumonia: /Pulmonary edema by x-ray: Continue IV antibiotics, Lasix.  4.hyperkalemia; resolved. Hold the by mouth potassium supplements.  #5 dementia  bed bound status: Continue Xanax ,  #Diabetes mellitus type 2 hold Amaryl use sliding scale with coverage only.  #6 chronic diastolic heart failure;  restart the Zaroxolyn in, Lasix  7.History of COPD: Patient has no wheezing at this time. Use nebulizers when necessary. At 8.bradycardia;resolved. #9 delirium secondary to ESBL UTI, baseline dementia. CT head unremarkable.  Prognosis poor.   Due to persistent hypoxia,lethargy, hypercarbia, ;move [facility] icu[facility]   Discussed about her case Also spoke to patients son this afternoon. More than 50% spent in coordination and counseling. All the records are reviewed and case discussed with Care Management/Social Workerr.    Management plans discussed with the patient, family and they are in agreement.  CODE STATUS:full  TOTAL TIME TAKING CARE OF THIS PATIENT: 35 minutes.  Critical care time  POSSIBLE D/C IN 1-2  DAYS, DEPENDING ON CLINICAL CONDITION.   Katha HammingKONIDENA,[name] M.D on 12/09/2014 at 4:25 PM  Between 7am to 6pm - Pager - [phone]  After 6pm go to [email or web address] - password [email or web address] [facility]  [facility] [place]  Office  [phone]  CC: Primary care physician; Patrice ParadiseMCLAUGHLIN, [name], MD   Note: This dictation was prepared with Dragon dictation along with smaller phrase technology. Any transcriptional errors that result from this process are unintentional.

## 2014-12-09 NOTE — Progress Notes (Signed)
Dr Luberta MutterKonidena was paged to be notified of pt's critical potassium level. Waiting for call to be returned.

## 2014-12-09 NOTE — Plan of Care (Signed)
Problem: Respiratory: Goal: Ability to maintain adequate ventilation will improve Outcome: Progressing Continues disoriented. Continues on O2 now at 4 L, sats 92 %. On isolation for ESBL. Continue to monitor.

## 2014-12-09 NOTE — Progress Notes (Signed)
Speech Therapy Note:  Reviewed chart, consulted NSG and visited Pt's room. Members of student NSG staff were present in Pt's room and reported she is refusing to eat and will spit out food. NSG reports that she has not taken any pills with her this morning, but does present with a suspected declined cognitive status today. ST witnessed a similar presentation yesterday during limited swallow evaluation (limited as Pt refused certain trials and was uncooperative).  Pt is currently on a puree diet with honey and will remain on this diet due to an increased risk for aspiration based on current cognitive presentation until ST is able to provide trials and Pt is willing to receive trials of upgraded consistencies. NSG updated.

## 2014-12-09 NOTE — H&P (Addendum)
Dr. Luberta Mutterkonidena notified by Myrna RN earlier r/t pt decline and pt was on bipap at the time.  Pt pulling off bipap mask and resp switched her back to nasal cannula, son at bedside at 1300 and spoke with Maltakonidena on telephone.  Son reported that Dr. Luberta MutterKonidena was possibly going to leave BIPAP on throughout the night and monitor.  Pt unwilling to leave bipap on. At 1515 dr. Luberta Mutterkonidena notified of pts decline in no verbal responses, decreased resp effort, abg results and vital signs, pt noted with color changes of skin and inability to respond verbally at this time.  Dr. Luberta Mutterkonidena ordered to transfer to ccu

## 2014-12-09 NOTE — H&P (Signed)
[facility] [place] [facility] and pt transferred to room ccu4, Dolly RiasKim Hastings daughter notified of pt move to ccu.

## 2014-12-09 NOTE — Progress Notes (Addendum)
Palliative Care Update   Chart, patient's problems,  and history reviewed. Discussed patient's swallowing issues with ST.    Will follow through with full consult and discussions with family tomorrow. She is currently Full Code.    Suan HalterMargaret F Delena Casebeer, MD

## 2014-12-09 NOTE — Progress Notes (Signed)
Pt's son Jorja Loaim speaking with Dr Luberta MutterKonidena about plan and code status. Possible making pt DNR

## 2014-12-09 NOTE — Care Management Important Message (Signed)
Important Message  Patient Details  Name: Mindy Cook MRN: [id number] Date of Birth: Mar 23, 1933   Medicare Important Message Given:  Yes    Gwenette GreetBrenda S Rondi Ivy, RN 12/09/2014, 11:26 AM

## 2014-12-09 NOTE — Progress Notes (Signed)
Bi Pap applied by RT per MD orders. HR 59-60 at this moment. Pt sleepy and speaks one word at a time. MD has assess pt and is aware of assessment. Will continue to monitor

## 2014-12-09 NOTE — Progress Notes (Signed)
eLink Physician-Brief Progress Note Patient Name: Mindy Cook DOB: 12/01/1933 MRN: [id number]   Date of Service  12/09/2014  HPI/Events of Note  Spoke with husband, Mindy Cook, who relates that his wife would not want intubation, CPR, Defibrillation or ACLS drugs given shoul she have a cardiac or respiratory arrest. Will respect the wishes of the patient and her husband and will make the patient a DNR/DNI.  eICU Interventions  Will order DNR/DNI.     Intervention Category Minor Interventions: Communication with other healthcare providers and/or family  Lenell AntuSommer,Steven Eugene 12/09/2014, 5:53 PM

## 2014-12-09 NOTE — Progress Notes (Signed)
Dr Luberta MutterKonidena was made aware of potassium level. States she will call pharmacy to be replaced

## 2014-12-09 NOTE — Clinical Social Work Note (Signed)
Clinical Social Work Assessment  Patient Details  Name: Mindy Cook MRN: [id number] Date of Birth: 08-Nov-1933  Date of referral:  12/09/14               Reason for consult:  Facility Placement                Permission sought to share information with:  Family Supports Permission granted to share information::  Yes, Verbal Permission Granted  Name::      (Pt's son, Mindy Cook)   Housing/Transportation Living arrangements for the past 2 months:  Skilled Building surveyorursing Facility Source of Information:  Adult Children Patient Interpreter Needed:  None Criminal Activity/Legal Involvement Pertinent to Current Situation/Hospitalization:  No - Comment as needed Significant Relationships:  Adult Children Lives with:  Facility Resident Do you feel safe going back to the place where you live?  Yes Need for family participation in patient care:  Yes (Comment)  Care giving concerns:  No care giving concerns identified.    Social Worker assessment / plan:  CSW spoke with pt's son to address consult as [facility] Pt was very drousy when CSW attemtped to assess pt. Pt's son is agreeable to [facility]. CSW updated facility and pt is able to return. CSW will continue to follow.   Employment status:  Retired Database administratornsurance information:  Managed Medicare PT Recommendations:  Skilled Nursing Facility Information / Referral to community resources:  Skilled Nursing Facility  Patient/Family's Response to care:  Pt's son was Adult nurseappreciative of CSW support.   Patient/Family's Understanding of and Emotional Response to Diagnosis, Current Treatment, and Prognosis:  Pt's son is in agreement that pt needs to return to SNF at discharge.   Emotional Assessment Appearance:   Older than stated age Attitude/Demeanor/Rapport:  Lethargic Affect (typically observed):  Appropriate Orientation:  Fluctuating Orientation (Suspected and/or reported Sundowners), Oriented to  Self Alcohol / Substance use:  Never Used Psych involvement (Current and /or in the community):  No (Comment)  Discharge Needs  Concerns to be addressed:  No discharge needs identified Readmission within the last 30 days:  Yes Current discharge risk:  None Barriers to Discharge:  No Barriers Identified   Dede QuerySarah Geneviene Tesch, LCSW 12/09/2014, 3:07 PM

## 2014-12-10 ENCOUNTER — Inpatient Hospital Stay: Payer: Medicare Other

## 2014-12-10 DIAGNOSIS — J690 Pneumonitis due to inhalation of food and vomit: Secondary | ICD-10-CM

## 2014-12-10 DIAGNOSIS — R5381 Other malaise: Secondary | ICD-10-CM

## 2014-12-10 DIAGNOSIS — G9341 Metabolic encephalopathy: Secondary | ICD-10-CM

## 2014-12-10 DIAGNOSIS — R29898 Other symptoms and signs involving the musculoskeletal system: Secondary | ICD-10-CM

## 2014-12-10 LAB — CBC WITH DIFFERENTIAL/PLATELET
Basophils Absolute: 0 10*3/uL (ref 0–0.1)
Basophils Relative: 0 %
EOS ABS: 0.1 10*3/uL (ref 0–0.7)
EOS PCT: 1 %
HCT: 34.5 % — ABNORMAL LOW (ref 35.0–47.0)
Hemoglobin: 11 g/dL — ABNORMAL LOW (ref 12.0–16.0)
LYMPHS ABS: 0.9 10*3/uL — AB (ref 1.0–3.6)
Lymphocytes Relative: 9 %
MCH: 29.9 pg (ref 26.0–34.0)
MCHC: 31.9 g/dL — ABNORMAL LOW (ref 32.0–36.0)
MCV: 93.8 fL (ref 80.0–100.0)
MONO ABS: 0.8 10*3/uL (ref 0.2–0.9)
Monocytes Relative: 9 %
Neutro Abs: 7.8 10*3/uL — ABNORMAL HIGH (ref 1.4–6.5)
Neutrophils Relative %: 81 %
PLATELETS: 152 10*3/uL (ref 150–440)
RBC: 3.67 MIL/uL — AB (ref 3.80–5.20)
RDW: 17.4 % — AB (ref 11.5–14.5)
WBC: 9.6 10*3/uL (ref 3.6–11.0)

## 2014-12-10 LAB — BASIC METABOLIC PANEL
Anion gap: 7 (ref 5–15)
BUN: 26 mg/dL — AB (ref 6–20)
CALCIUM: 9.2 mg/dL (ref 8.9–10.3)
CO2: 38 mmol/L — ABNORMAL HIGH (ref 22–32)
CREATININE: 0.76 mg/dL (ref 0.44–1.00)
Chloride: 98 mmol/L — ABNORMAL LOW (ref 101–111)
GFR calc Af Amer: >60 mL/min (ref >60)
GLUCOSE: 171 mg/dL — AB (ref 65–99)
POTASSIUM: 3.7 mmol/L (ref 3.5–5.1)
SODIUM: 143 mmol/L (ref 135–145)

## 2014-12-10 LAB — GLUCOSE, CAPILLARY
GLUCOSE-CAPILLARY: 124 mg/dL — AB (ref 65–99)
GLUCOSE-CAPILLARY: 143 mg/dL — AB (ref 65–99)
Glucose-Capillary: 153 mg/dL — ABNORMAL HIGH (ref 65–99)
Glucose-Capillary: 142 mg/dL — ABNORMAL HIGH (ref 65–99)
Glucose-Capillary: 140 mg/dL — ABNORMAL HIGH (ref 65–99)

## 2014-12-10 LAB — PHOSPHORUS: PHOSPHORUS: 4.2 mg/dL (ref 2.5–4.6)

## 2014-12-10 LAB — POTASSIUM: Potassium: 2.8 mmol/L — CL (ref 3.5–5.1)

## 2014-12-10 MED ORDER — POTASSIUM CHLORIDE 10 MEQ/100ML IV SOLN
10.0000 meq | INTRAVENOUS | Status: DC
  Administered 2014-12-10: 10 meq via INTRAVENOUS
  Filled 2014-12-10 (×6): qty 100

## 2014-12-10 MED ORDER — POTASSIUM CHLORIDE 20 MEQ/15ML (10%) PO SOLN
40.0000 meq | ORAL | Status: AC
  Administered 2014-12-10 (×2): 40 meq via ORAL
  Filled 2014-12-10 (×2): qty 30

## 2014-12-10 MED ORDER — PNEUMOCOCCAL 13-VAL CONJ VACC IM SUSP: 0.5000 mL | INTRAMUSCULAR | Status: DC | PRN

## 2014-12-10 NOTE — Progress Notes (Signed)
eLink Physician-Brief Progress Note Patient Name: Mindy Cook DOB: 01/30/1933 MRN: [id number]   Date of Service  12/10/2014  HPI/Events of Note  Patient not able to tolerate IV potassium  eICU Interventions  Change to oral potassium      Intervention Category Major Interventions: Electrolyte abnormality - evaluation and management  DETERDING,ELIZABETH 12/10/2014, 3:06 AM

## 2014-12-10 NOTE — Progress Notes (Signed)
Pt alert, oriented to self and place.  VSS.  2LNC.  Afib with occasional PVCs on cardiac monitor.  Patient to transfer to 1C.  Report called to Annice PihJackie, Charity fundraiserN.

## 2014-12-10 NOTE — Progress Notes (Signed)
[facility] - [place] at [facility]   PATIENT NAME: Mindy Cook    MR#:  [id number]  DATE OF BIRTH:  21-Apr-1933  SUBJECTIVE: Admitted for altered mental status and respiratory distress. Found to have UTI and sepsis. Isolation secondary to ESBL UTI.  Transfer red to ICU yesterday afternoon secondary to respiratory distress and lethargic.she is awake alert oriented and able to remember family members. Discussed with the son and he said that she is at baseline.patient denies chest pain, shortness of breath, nausea, vomiting, diarrhea.  CHIEF COMPLAINT:   Chief Complaint  Patient presents with  . Respiratory Distress    REVIEW OF SYSTEMS:   Review of Systems  Constitutional: Negative for fever and chills.  HENT: Negative for hearing loss.   Eyes: Negative for blurred vision, double vision and photophobia.  Respiratory: Negative for cough, hemoptysis and shortness of breath.   Cardiovascular: Negative for palpitations, orthopnea and leg swelling.  Gastrointestinal: Negative for vomiting, abdominal pain and diarrhea.  Genitourinary: Negative for dysuria and urgency.  Musculoskeletal: Negative for myalgias and neck pain.  Skin: Negative for rash.  Neurological: Negative for dizziness, focal weakness, seizures, weakness and headaches.  Psychiatric/Behavioral: Negative for memory loss. The patient does not have insomnia.      Allergies  Allergen Reactions  . Hydrocodone-Acetaminophen Nausea Only    VITALS:  Blood pressure 111/59, pulse 72, temperature 98.1 F (36.7 C), temperature source Oral, resp. rate 24, height  (1.6 m), weight 81.8 kg (180 lb 5.4 oz), SpO2 97 %.  PHYSICAL EXAMINATION:  GENERAL:  81 y.o.-year-old patient lying in the bed with no acute distress.  EYES: Pupils equal, round, reactive to light and accommodation. No scleral icterus. Extraocular muscles intact.  HEENT: Head atraumatic, normocephalic. Oropharynx and nasopharynx clear.   NECK:  Supple, no jugular venous distention. No thyroid enlargement, no tenderness.  LUNGS: Normal breath sounds bilaterally, no wheezing, rales,rhonchi or crepitation. No use of accessory muscles of respiration.  CARDIOVASCULAR: S1, S2 normal. No murmurs, rubs, or gallops.  ABDOMEN: Soft, nontender, nondistended. Bowel sounds present. No organomegaly or mass.  EXTREMITIES: No pedal edema, cyanosis, or clubbing.  NEUROLOGIC: Cranial nerves II through XII are intact. Muscle strength 5/5 in all extremities. Sensation intact. Gait not checked.  PSYCHIATRIC: Confused  SKIN: No obvious rash, lesion, or ulcer.    LABORATORY PANEL:   CBC  Recent Labs Lab 12/10/14 0437  WBC 9.6  HGB 11.0*  HCT 34.5*  PLT 152   ------------------------------------------------------------------------------------------------------------------  Chemistries   Recent Labs Lab 12/06/14 0702  12/08/14 1307  12/10/14 0437  NA 139  < >  --   < > 143  K 6.0*  < >  --   < > 3.7  CL 92*  < >  --   < > 98*  CO2 44*  < >  --   < > 38*  GLUCOSE 287*  < >  --   < > 171*  BUN 29*  < >  --   < > 26*  CREATININE 0.94  < >  --   < > 0.76  CALCIUM 10.7*  < >  --   < > 9.2  MG  --   --  1.9  --   --   AST 18  --   --   --   --   ALT 13*  --   --   --   --   ALKPHOS 59  --   --   --   --  BILITOT 0.7  --   --   --   --   < > = values in this interval not displayed. ------------------------------------------------------------------------------------------------------------------  Cardiac Enzymes  Recent Labs Lab 12/06/14 0702  TROPONINI 0.04*   ------------------------------------------------------------------------------------------------------------------  RADIOLOGY:  Dg Chest 1 View  12/10/2014  CLINICAL DATA:  Shortness of Breath EXAM: CHEST 1 VIEW COMPARISON:  December 07, 2014 and August 28, 2014 FINDINGS: Scattered areas of scarring are noted bilaterally. There is less interstitial edema compared  to recent prior study. Trace edema is felt to remain. There is less fluid in the minor fissure region on the right compared to recent prior study. There is no new opacity. Cardiomegaly is stable. The pulmonary vascularity is within normal limits. No adenopathy. Prosthetic aortic valve present. There is atherosclerotic calcification in the aorta. IMPRESSION: Underlying parenchymal scarring bilaterally. There is now only trace edema. There is only minimal residual fluid in the right minor fissure. No new opacities. The appearance is consistent with partial resolution of a degree of congestive heart failure. Cardiomegaly is stable. Patient is status post aortic valve replacement. Electronically Signed   By: Bretta BangWilliam  Woodruff III M.D.   On: 12/10/2014 07:22   Dg Abd 1 View  12/08/2014  CLINICAL DATA:  Urinary tract infection and sepsis. EXAM: ABDOMEN - 1 VIEW COMPARISON:  08/16/2014 FINDINGS: Evidence for a cardiac TAVR procedure. Gas and stool in the abdomen. Nonobstructive bowel gas pattern. Sclerosis of the pubic symphysis. Cannot exclude densities at the left lung base. IMPRESSION: Nonobstructive bowel gas pattern. Probable left basilar chest densities. Electronically Signed   By: Richarda OverlieAdam  Henn M.D.   On: 12/08/2014 15:07    EKG:   Orders placed or performed during the hospital encounter of 09/16/14  . EKG 12-Lead  . EKG 12-Lead  . ED EKG  . ED EKG  . EKG    ASSESSMENT AND PLAN:  1. Sepsis ESBL GNF:AOZHYQMVTI:continue ertapenam  #2 metabolic encephalopathy secondary to sepsis: resolved.  3. Acute hypoxia secondary to possible pneumonia: /Pulmonary edema by x-ray: Continue IV antibiotics, Lasix. Hypercarbia with lethargy: Improved with the BiPAP. She is alert and oriented.  4.hypokalemia ;replaced the potassium now potassium is normal.  #5 dementia bed bound status: Xanax was stopped yesterday because of her lethargy. Resume at discharge.  #Diabetes mellitus type 2 ;hold Amaryl use sliding scale with  coverage only.a.m. Amaryl once her by mouth intake is better.  #6 chronic diastolic heart failure;  restarted the Zaroxolyn in, Lasix  7.History of COPD: Patient has no wheezing at this time. Use nebulizers when necessary. At 8.bradycardia;resolved. #9 delirium secondary to ESBL UTI,, hypercarbia resolved now Derenda FennelOrchard is DO NOT RESUSCITATE Discussed with the son today in ICU. More than 50% spent in coordination and counseling. All the records are reviewed and case discussed with Care Management/Social Workerr.    Management plans discussed with the patient, family and they are in agreement.  CODE STATUS:full  TOTAL TIME TAKING CARE OF THIS PATIENT;40 min  POSSIBLE D/C IN 1-2  DAYS, DEPENDING ON CLINICAL CONDITION.   Katha HammingKONIDENA,Natnael Biederman M.D on 12/10/2014 at 12:50 PM  Between 7am to 6pm - Pager - [phone]  After 6pm go to [email or web address] - password [email or web address] [facility]  [place] [place] [facility]  Office  [phone]  CC: Primary care physician; Patrice ParadiseMCLAUGHLIN, [name], MD   Note: This dictation was prepared with Dragon dictation along with smaller phrase technology. Any transcriptional errors that result from this process are unintentional.

## 2014-12-10 NOTE — Progress Notes (Signed)
[facility] Progess Note    ASSESSMENT/PLAN   PULMONARY  A: Acute hypoxic respiratory failure with chronic hypercapnic respiratory failure.  -Chronic hypoxic respiratory failure on 2-3L oxygen at home.  -Pneumonia with sepsis.   P:  Given her metabolic encephalopathy, delirium, and dysphagia, she is at risk of aspiration pneumonia.  --Continue empiric treatment for aspiration pneumonia with oral doxy.  --Pt made DNR status overnight per chart.   OK to transfer out of ICU.   CARDIOVASCULAR  A:D-CHF, stable.  Afib, s/p RVR, now controlled.  P:    RENAL A: Stable.   GASTROINTESTINAL A: Dysphagia.  P: Dysphagia.   HEMATOLOGIC -  INFECTIOUS A: Possible aspiration pneumonia.  Will treat with abx.   ENDOCRINE A:   NEUROLOGIC A: Metabolic encephalopathy. Delirium.  --Stable.  ---------------------------------------   ----------------------------------------   Name: Mindy Cook MRN: [id number] DOB: 01-Dec-1933    ADMISSION DATE:  12/06/2014    SUBJECTIVE/ROS:   Pt currently  confused, can not provide history or review of systems.     VITAL SIGNS: Temp:  [97.5 F (36.4 C)-98.4 F (36.9 C)] 98.1 F (36.7 C) (11/30 0800) Pulse Rate:  [50-90] 72 (11/30 1200) Resp:  [14-35] 24 (11/30 1200) BP: (98-164)/(44-71) 111/59 mmHg (11/30 1200) SpO2:  [95 %-100 %] 97 % (11/30 1200) FiO2 (%):  [40 %-45 %] 45 % (11/30 0700) Weight:  [81.8 kg (180 lb 5.4 oz)] 81.8 kg (180 lb 5.4 oz) (11/29 1600) HEMODYNAMICS:   VENTILATOR SETTINGS: Vent Mode:  [-]  FiO2 (%):  [40 %-45 %] 45 % INTAKE / OUTPUT:  Intake/Output Summary (Last 24 hours) at 12/10/14 1250 Last data filed at 12/10/14 1100  Gross per 24 hour  Intake    980 ml  Output      0 ml  Net    980 ml    PHYSICAL EXAMINATION: Physical Examination:   VS: BP 111/59 mmHg  Pulse 72  Temp(Src) 98.1 F (36.7 C) (Oral)  Resp 24  Ht  (1.6 m)  Wt 81.8 kg (180  lb 5.4 oz)  BMI 31.95 kg/m2  SpO2 97%  General Appearance: No distress  Neuro:without focal findings, mental status diminished. HEENT: PERRLA, EOM intact. Pulmonary: normal breath sounds   CardiovascularNormal S1,S2.  No m/r/g.   Abdomen: Benign, Soft, non-tender. Renal:  No costovertebral tenderness  GU:  Not performed at this time. Endocrine: No evident thyromegaly. Skin:   warm, no rashes, no ecchymosis  Extremities: normal, no cyanosis, clubbing.   LABS:   LABORATORY PANEL:   CBC  Recent Labs Lab 12/10/14 0437  WBC 9.6  HGB 11.0*  HCT 34.5*  PLT 152    Chemistries   Recent Labs Lab 12/06/14 0702  12/08/14 1307  12/10/14 0001 12/10/14 0437  NA 139  < >  --   < >  --  143  K 6.0*  < >  --   < > 2.8* 3.7  CL 92*  < >  --   < >  --  98*  CO2 44*  < >  --   < >  --  38*  GLUCOSE 287*  < >  --   < >  --  171*  BUN 29*  < >  --   < >  --  26*  CREATININE 0.94  < >  --   < >  --  0.76  CALCIUM 10.7*  < >  --   < >  --  9.2  MG  --   --  1.9  --   --   --   PHOS  --   < > 1.8*  --  4.2  --   AST 18  --   --   --   --   --   ALT 13*  --   --   --   --   --   ALKPHOS 59  --   --   --   --   --   BILITOT 0.7  --   --   --   --   --   < > = values in this interval not displayed.   Recent Labs Lab 12/09/14 0710 12/09/14 1115 12/09/14 1615 12/09/14 2235 12/10/14 0715 12/10/14 1112  GLUCAP 174* 159* 130* 124* 153* 142*    Recent Labs Lab 12/08/14 1600 12/09/14 1435  PHART 7.50* 7.40  PCO2ART 53* 67*  PO2ART 72* 115*    Recent Labs Lab 12/06/14 0702  AST 18  ALT 13*  ALKPHOS 59  BILITOT 0.7  ALBUMIN 3.9    Cardiac Enzymes  Recent Labs Lab 12/06/14 0702  TROPONINI 0.04*    RADIOLOGY:  Dg Chest 1 View  12/10/2014  CLINICAL DATA:  Shortness of Breath EXAM: CHEST 1 VIEW COMPARISON:  December 07, 2014 and August 28, 2014 FINDINGS: Scattered areas of scarring are noted bilaterally. There is less interstitial edema compared to recent prior  study. Trace edema is felt to remain. There is less fluid in the minor fissure region on the right compared to recent prior study. There is no new opacity. Cardiomegaly is stable. The pulmonary vascularity is within normal limits. No adenopathy. Prosthetic aortic valve present. There is atherosclerotic calcification in the aorta. IMPRESSION: Underlying parenchymal scarring bilaterally. There is now only trace edema. There is only minimal residual fluid in the right minor fissure. No new opacities. The appearance is consistent with partial resolution of a degree of congestive heart failure. Cardiomegaly is stable. Patient is status post aortic valve replacement. Electronically Signed   By: Bretta BangWilliam  Woodruff III M.D.   On: 12/10/2014 07:22   Dg Abd 1 View  12/08/2014  CLINICAL DATA:  Urinary tract infection and sepsis. EXAM: ABDOMEN - 1 VIEW COMPARISON:  08/16/2014 FINDINGS: Evidence for a cardiac TAVR procedure. Gas and stool in the abdomen. Nonobstructive bowel gas pattern. Sclerosis of the pubic symphysis. Cannot exclude densities at the left lung base. IMPRESSION: Nonobstructive bowel gas pattern. Probable left basilar chest densities. Electronically Signed   By: Richarda OverlieAdam  Henn M.D.   On: 12/08/2014 15:07       --Wells Guileseep Pluma Diniz, MD.  [facility]  Santiago Gladavid Kasa, M.D.  Stephanie AcreVishal Mungal, M.D.  Billy Fischeravid Simonds, M.D

## 2014-12-10 NOTE — Progress Notes (Signed)
RN spoke with Dr. Luberta MutterKonidena on the phone and MD gave order for patient to be transferred to any floor with tele.

## 2014-12-10 NOTE — Progress Notes (Signed)
Alert to person and place. Denies pain. No respiratory distress during shift. o2 sats upper 90's on 2L nasal cannula. Afib with PVC per cardiac monitor, rate controlled. Son, grandson and daughter visited during shift. Patient will be moving to room 119 after shift change.  Report given to Hiral, Charity fundraiserN.

## 2014-12-11 DIAGNOSIS — N390 Urinary tract infection, site not specified: Secondary | ICD-10-CM

## 2014-12-11 DIAGNOSIS — J449 Chronic obstructive pulmonary disease, unspecified: Secondary | ICD-10-CM

## 2014-12-11 DIAGNOSIS — E876 Hypokalemia: Secondary | ICD-10-CM

## 2014-12-11 DIAGNOSIS — Z515 Encounter for palliative care: Secondary | ICD-10-CM

## 2014-12-11 DIAGNOSIS — Z7401 Bed confinement status: Secondary | ICD-10-CM

## 2014-12-11 DIAGNOSIS — E875 Hyperkalemia: Secondary | ICD-10-CM

## 2014-12-11 DIAGNOSIS — R410 Disorientation, unspecified: Secondary | ICD-10-CM

## 2014-12-11 DIAGNOSIS — R630 Anorexia: Secondary | ICD-10-CM

## 2014-12-11 DIAGNOSIS — B9689 Other specified bacterial agents as the cause of diseases classified elsewhere: Secondary | ICD-10-CM

## 2014-12-11 DIAGNOSIS — A4189 Other specified sepsis: Secondary | ICD-10-CM

## 2014-12-11 DIAGNOSIS — R0902 Hypoxemia: Secondary | ICD-10-CM | POA: Insufficient documentation

## 2014-12-11 DIAGNOSIS — R1310 Dysphagia, unspecified: Secondary | ICD-10-CM

## 2014-12-11 LAB — CULTURE, BLOOD (ROUTINE X 2): CULTURE: NO GROWTH

## 2014-12-11 LAB — BASIC METABOLIC PANEL
Anion gap: 5 (ref 5–15)
BUN: 25 mg/dL — AB (ref 6–20)
CHLORIDE: 98 mmol/L — AB (ref 101–111)
CO2: 40 mmol/L — AB (ref 22–32)
CREATININE: 0.72 mg/dL (ref 0.44–1.00)
Calcium: 9.6 mg/dL (ref 8.9–10.3)
GFR calc Af Amer: >60 mL/min (ref >60)
GFR calc non Af Amer: >60 mL/min (ref >60)
GLUCOSE: 146 mg/dL — AB (ref 65–99)
Potassium: 3.6 mmol/L (ref 3.5–5.1)
Sodium: 143 mmol/L (ref 135–145)

## 2014-12-11 LAB — CBC
HEMATOCRIT: 34.4 % — AB (ref 35.0–47.0)
HEMOGLOBIN: 11 g/dL — AB (ref 12.0–16.0)
MCH: 30.4 pg (ref 26.0–34.0)
MCHC: 32.1 g/dL (ref 32.0–36.0)
MCV: 94.6 fL (ref 80.0–100.0)
Platelets: 144 10*3/uL — ABNORMAL LOW (ref 150–440)
RBC: 3.64 MIL/uL — ABNORMAL LOW (ref 3.80–5.20)
RDW: 17.4 % — ABNORMAL HIGH (ref 11.5–14.5)
WBC: 8 10*3/uL (ref 3.6–11.0)

## 2014-12-11 LAB — GLUCOSE, CAPILLARY
GLUCOSE-CAPILLARY: 143 mg/dL — AB (ref 65–99)
Glucose-Capillary: 119 mg/dL — ABNORMAL HIGH (ref 65–99)
Glucose-Capillary: 168 mg/dL — ABNORMAL HIGH (ref 65–99)
Glucose-Capillary: 163 mg/dL — ABNORMAL HIGH (ref 65–99)

## 2014-12-11 MED ORDER — CARVEDILOL 3.125 MG PO TABS
3.1250 mg | ORAL_TABLET | Freq: Two times a day (BID) | ORAL | Status: DC
  Administered 2014-12-11 – 2014-12-13 (×5): 3.125 mg via ORAL
  Filled 2014-12-11 (×5): qty 1

## 2014-12-11 MED ORDER — ROPINIROLE HCL 0.25 MG PO TABS
0.2500 mg | ORAL_TABLET | Freq: Every day | ORAL | Status: DC
  Administered 2014-12-12: 22:00:00 0.25 mg via ORAL
  Filled 2014-12-11 (×3): qty 1

## 2014-12-11 MED ORDER — POTASSIUM CHLORIDE CRYS ER 20 MEQ PO TBCR
20.0000 meq | EXTENDED_RELEASE_TABLET | Freq: Two times a day (BID) | ORAL | Status: DC
  Administered 2014-12-11 – 2014-12-13 (×5): 20 meq via ORAL
  Filled 2014-12-11 (×5): qty 1

## 2014-12-11 MED ORDER — PRAVASTATIN SODIUM 40 MG PO TABS
40.0000 mg | ORAL_TABLET | Freq: Every day | ORAL | Status: DC
  Administered 2014-12-11 – 2014-12-12 (×2): 40 mg via ORAL
  Filled 2014-12-11 (×2): qty 1

## 2014-12-11 MED ORDER — ALLOPURINOL 100 MG PO TABS
100.0000 mg | ORAL_TABLET | Freq: Every day | ORAL | Status: DC
  Administered 2014-12-11 – 2014-12-13 (×3): 100 mg via ORAL
  Filled 2014-12-11 (×3): qty 1

## 2014-12-11 MED ORDER — DIGOXIN 125 MCG PO TABS
0.0625 mg | ORAL_TABLET | Freq: Every day | ORAL | Status: DC
  Administered 2014-12-11 – 2014-12-12 (×2): 0.0625 mg via ORAL
  Administered 2014-12-13: 09:00:00 0.125 mg via ORAL
  Filled 2014-12-11 (×3): qty 1

## 2014-12-11 MED ORDER — CIPROFLOXACIN IN D5W 400 MG/200ML IV SOLN
400.0000 mg | Freq: Two times a day (BID) | INTRAVENOUS | Status: DC
  Administered 2014-12-11 – 2014-12-13 (×5): 400 mg via INTRAVENOUS
  Filled 2014-12-11 (×7): qty 200

## 2014-12-11 MED ORDER — FERROUS SULFATE 325 (65 FE) MG PO TABS
325.0000 mg | ORAL_TABLET | Freq: Every day | ORAL | Status: DC
  Administered 2014-12-11 – 2014-12-13 (×3): 325 mg via ORAL
  Filled 2014-12-11 (×3): qty 1

## 2014-12-11 MED ORDER — GABAPENTIN 300 MG PO CAPS
300.0000 mg | ORAL_CAPSULE | Freq: Every day | ORAL | Status: DC
  Administered 2014-12-11 – 2014-12-12 (×2): 300 mg via ORAL
  Filled 2014-12-11 (×2): qty 1

## 2014-12-11 MED ORDER — FUROSEMIDE 20 MG PO TABS
20.0000 mg | ORAL_TABLET | Freq: Every day | ORAL | Status: DC
  Administered 2014-12-11 – 2014-12-13 (×3): 20 mg via ORAL
  Filled 2014-12-11 (×3): qty 1

## 2014-12-11 NOTE — Progress Notes (Signed)
Initial Nutrition Assessment     INTERVENTION:  Meals and snacks:Monitor intake Medical Nutrition Supplement Therapy: Will add honey thick mightyshake TID for added nutrition   NUTRITION DIAGNOSIS:   Inadequate oral intake related to acute illness as evidenced by meal completion < 50%.    GOAL:   Patient will meet greater than or equal to 90% of their needs     MONITOR:    (Energy intake)  REASON FOR ASSESSMENT:   LOS (diet order)    ASSESSMENT:      Pt admitted with AMS, respiratory distress, UTI sepsis, weakness Past Medical History  Diagnosis Date  . Asthma   . A-fib (HCC)   . Arthritis   . Aortic stenosis   . Aortic insufficiency   . Hyperlipidemia   . Hypertension   . Sleep apnea   . CHF (congestive heart failure) (HCC)   . History of DVT (deep vein thrombosis)   . Diabetes mellitus type 2, uncontrolled (HCC)   . COPD (chronic obstructive pulmonary disease) (HCC)   . Cervical spondylolysis   . DDD (degenerative disc disease), cervical   . Anemia   . Mitral valve disorder   . Anxiety   . Chronic gout   . Restless leg syndrome   . Osteoarthritis     Current Nutrition: 45%-50% intake per I and O sheet. Pt lethargic during visit this pm and unable to answer questions. No family at bedside  Food/Nutrition-Related History: unsure intake prior to admission   Scheduled Medications:  . allopurinol  100 mg Oral Daily  . antiseptic oral rinse  7 mL Mouth Rinse q12n4p  . carvedilol  3.125 mg Oral BID  . chlorhexidine  15 mL Mouth Rinse BID  . ciprofloxacin  400 mg Intravenous Q12H  . digoxin  0.0625 mg Oral Daily  . docusate sodium  100 mg Oral BID  . doxycycline (VIBRAMYCIN) IV  100 mg Intravenous Q12H  . enoxaparin (LOVENOX) injection  40 mg Subcutaneous Q24H  . ferrous sulfate  325 mg Oral Daily  . furosemide  20 mg Oral Daily  . gabapentin  300 mg Oral QHS  . insulin aspart  0-9 Units Subcutaneous TID WC  . metolazone  5 mg Oral Once per day  on Mon Wed Fri  . potassium chloride SA  20 mEq Oral BID  . pravastatin  40 mg Oral QHS  . rOPINIRole  0.25 mg Oral QHS  . sodium chloride  3 mL Intravenous Q12H       Electrolyte/Renal Profile and Glucose Profile:   Recent Labs Lab 12/08/14 1307 12/09/14 1257 12/10/14 0001 12/10/14 0437 12/11/14 0436  NA  --  144  --  143 143  K  --  2.6* 2.8* 3.7 3.6  CL  --  97*  --  98* 98*  CO2  --  38*  --  38* 40*  BUN  --  25*  --  26* 25*  CREATININE  --  0.72  --  0.76 0.72  CALCIUM  --  9.8  --  9.2 9.6  MG 1.9  --   --   --   --   PHOS 1.8*  --  4.2  --   --   GLUCOSE  --  153*  --  171* 146*   Protein Profile:  Recent Labs Lab 12/06/14 0702  ALBUMIN 3.9    Gastrointestinal Profile: Last BM unsure   Nutrition-Focused Physical Exam Findings: Nutrition-Focused physical exam completed. Findings are no fat  depletion, normal with mild temple wasting muscle depletion, and unable to assess edema.      Weight Change: 2% weight loss in the last 2 months    Diet Order:  DIET - DYS 1 Room service appropriate?: Yes with Assist; Fluid consistency:: Honey Thick  Skin:   reviewed     Height:   Ht Readings from Last 1 Encounters:  12/09/14 5\' 3"  (1.6 m)    Weight:   Wt Readings from Last 1 Encounters:  12/09/14 180 lb 5.4 oz (81.8 kg)    Ideal Body Weight:     BMI:  Body mass index is 31.95 kg/(m^2).  Estimated Nutritional Needs:   Kcal:  BEE 964 kcals (IF 1.1-1.3, AF 1.2) [id number] kcals/d.   Protein:  (1.0-1.2 g/kg) 52-62 gm/d  Fluid:  (25-4730ml/kg) 1300-1560 ml/d  EDUCATION NEEDS:   No education needs identified at this time  MODERATE Care Level  Nehal Witting B. Freida BusmanAllen, RD, LDN [phone] (pager)

## 2014-12-11 NOTE — Evaluation (Signed)
Physical Therapy Re-Evaluation Patient Details Name: Mindy Cook MRN: [id number] DOB: [birth year] Today's Date: 12/11/2014   History of Present Illness  Pt presented to ER secondary to increased lethargy, decreased O2 sats (70s upon arrival) and fever; admitted with AMS, respiratory distress related to sepsis secondary to UTI, PNA.  Pt transferred to CCU 12/09/14 d/t acute hypoxic respiratory failure with chronic hypercapnic respiratory failure.  Pt on 2-3 L/min O2 at home chronic.  New PT order received for re-eval after transfer out of CCU.  Clinical Impression  Pt reports residing at [facility] and no longer receiving physical therapy.  Pt reports being hoyered bed to w/c and gets pushed in w/c at facility.  Pt reports not being interested in therapy at this time.  D/t this, no further PT recommended (will complete PT order and discharge pt in house).  Recommend discharge back to LTC when medically appropriate.    Follow Up Recommendations No PT follow up    Equipment Recommendations       Recommendations for Other Services       Precautions / Restrictions Precautions Precautions: Fall Restrictions Weight Bearing Restrictions: No      Mobility  Bed Mobility Overal bed mobility: Needs Assistance Bed Mobility: Rolling Rolling: Max assist;+2 for physical assistance (nursing logrolling pt for clean-up in bed)            Transfers                 General transfer comment: pt reports being hoyer lift past few months  Ambulation/Gait                Stairs            Wheelchair Mobility    Modified Rankin (Stroke Patients Only)       Balance                                             Pertinent Vitals/Pain Pain Assessment: No/denies pain  Vitals stable and WFL throughout treatment session.    Home Living                   Additional Comments: Back to [facility] LTC.    Prior Function Level of Independence:  Needs assistance   Gait / Transfers Assistance Needed: Pt reports being hoyer lift to w/c and gets pushed in w/c.  ADL's / Homemaking Assistance Needed: Pt reports needing assist with toileting (gets cleaned-up in bed), bathing, meals.  Comments: SW contacted [facility] who reports pt is no longer receiving PT at facility and is in long-term care section.     Hand Dominance        Extremity/Trunk Assessment   Upper Extremity Assessment:  (UE's strength at least 3-/5 with active movement in bed; fairly good grip B)           Lower Extremity Assessment:  (B LE's at least 2+/5 to 3-/5 strength with AAROM in bed; B hip flexion at least 70-80 degrees (pt partially resisting effort for ROM); DF B to neutral)         Communication   Communication: No difficulties  Cognition Arousal/Alertness: Awake/alert Behavior During Therapy: WFL for tasks assessed/performed Overall Cognitive Status: Within Functional Limits for tasks assessed (Oriented to person (although stated her maiden name), DOB, hospital, and month/year.)  General Comments   Nursing cleared pt for participation in physical therapy.  Pt agreeable to limited PT re-eval.    Exercises        Assessment/Plan    PT Assessment Patent does not need any further PT services  PT Diagnosis Generalized weakness   PT Problem List    PT Treatment Interventions     PT Goals (Current goals can be found in the Care Plan section) Acute Rehab PT Goals Patient Stated Goal: [facility] PT Goal Formulation: With patient Time For Goal Achievement: 12/25/14 Potential to Achieve Goals: Good    Frequency     Barriers to discharge        Co-evaluation               End of Session Equipment Utilized During Treatment: Oxygen Activity Tolerance: Patient limited by fatigue Patient left: in bed;with call bell/phone within reach;with bed alarm set;with SCD's reapplied Nurse  Communication: Mobility status;Need for lift equipment         Time: 1345-1400 PT Time Calculation (min) (ACUTE ONLY): 15 min   Charges:   PT Evaluation $PT Re-evaluation: 1 Procedure     PT G CodesHendricks Limes:        Destany Severns 12/11/2014, 3:30 PM Hendricks LimesEmily Juquan Reznick, PT [phone]

## 2014-12-11 NOTE — Progress Notes (Signed)
[facility] Medicine Progess Note    ASSESSMENT/PLAN   PULMONARY  A: Acute hypoxic respiratory failure with chronic hypercapnic respiratory failure.  -Chronic hypoxic respiratory failure on 2-3L oxygen at home.  -Pneumonia with sepsis.   P:  Given her metabolic encephalopathy, delirium, and dysphagia, she is at risk of aspiration pneumonia.  --Continue empiric treatment for aspiration pneumonia with oral doxy.  --Pt made DNR status overnight per chart.  --now with good clinical improvement, able to state name/dob/and location  CARDIOVASCULAR  A:D-CHF, stable.  Afib, s/p RVR, now controlled.  P:    RENAL A: Stable.   GASTROINTESTINAL A: Dysphagia.  P: Dysphagia.   HEMATOLOGIC -  INFECTIOUS A: Possible aspiration pneumonia.  Will treat with abx.   ENDOCRINE A:   NEUROLOGIC A: Metabolic encephalopathy. Delirium. - improving  --------------------------------------- Thank you for consulting [facility], we will signoff at this time.  Please feel free to contacts us with any questions.      ----------------------------------------   Name: Mindy Cook MRN: [id number] DOB: 13-Jun-1933    ADMISSION DATE:  12/06/2014    SUBJECTIVE/ROS:   More alert today, able to state DOB/location/year. No acute issues    VITAL SIGNS: Temp:  [97.7 F (36.5 C)-98.3 F (36.8 C)] 97.8 F (36.6 C) (12/01 1120) Pulse Rate:  [60-92] 77 (12/01 1120) Resp:  [18-30] 18 (12/01 0536) BP: (111-158)/(45-65) 144/52 mmHg (12/01 1120) SpO2:  [80 %-100 %] 99 % (12/01 1120) HEMODYNAMICS:   VENTILATOR SETTINGS:   INTAKE / OUTPUT:  Intake/Output Summary (Last 24 hours) at 12/11/14 1216 Last data filed at 12/11/14 0835  Gross per 24 hour  Intake    200 ml  Output      3 ml  Net    197 ml   ROS HEENT - no HA, + mild L eye pain CVS - no sob,  Resp - mild cough, no sputum production ABD- no abd pain, no  diarrhea MSK - no jt tenderness    PHYSICAL EXAMINATION: Physical Examination:   VS: BP 144/52 mmHg  Pulse 77  Temp(Src) 97.8 F (36.6 C) (Oral)  Resp 18  Ht 5\' 3"  (1.6 m)  Wt 180 lb 5.4 oz (81.8 kg)  BMI 31.95 kg/m2  SpO2 99%  General Appearance: No distress  Neuro:without focal findings, mental status diminished. HEENT: PERRLA, EOM intact. Pulmonary: normal breath sounds   CardiovascularNormal S1,S2.  No m/r/g.   Abdomen: Benign, Soft, non-tender. Renal:  No costovertebral tenderness  GU:  Not performed at this time. Endocrine: No evident thyromegaly. Skin:   warm, no rashes, no ecchymosis  Extremities: normal, no cyanosis, clubbing.   LABS:   LABORATORY PANEL:   CBC  Recent Labs Lab 12/11/14 0436  WBC 8.0  HGB 11.0*  HCT 34.4*  PLT 144*    Chemistries   Recent Labs Lab 12/06/14 0702  12/08/14 1307  12/10/14 0001  12/11/14 0436  NA 139  < >  --   < >  --   < > 143  K 6.0*  < >  --   < > 2.8*  < > 3.6  CL 92*  < >  --   < >  --   < > 98*  CO2 44*  < >  --   < >  --   < > 40*  GLUCOSE 287*  < >  --   < >  --   < > 146*  BUN 29*  < >  --   < >  --   < >  25*  CREATININE 0.94  < >  --   < >  --   < > 0.72  CALCIUM 10.7*  < >  --   < >  --   < > 9.6  MG  --   --  1.9  --   --   --   --   PHOS  --   < > 1.8*  --  4.2  --   --   AST 18  --   --   --   --   --   --   ALT 13*  --   --   --   --   --   --   ALKPHOS 59  --   --   --   --   --   --   BILITOT 0.7  --   --   --   --   --   --   < > = values in this interval not displayed.   Recent Labs Lab 12/10/14 0715 12/10/14 1112 12/10/14 1646 12/10/14 2220 12/11/14 0732 12/11/14 1141  GLUCAP 153* 142* 143* 140* 119* 168*    Recent Labs Lab 12/08/14 1600 12/09/14 1435  PHART 7.50* 7.40  PCO2ART 53* 67*  PO2ART 72* 115*    Recent Labs Lab 12/06/14 0702  AST 18  ALT 13*  ALKPHOS 59  BILITOT 0.7  ALBUMIN 3.9    Cardiac Enzymes  Recent Labs Lab 12/06/14 0702  TROPONINI 0.04*     RADIOLOGY:  Dg Chest 1 View  12/10/2014  CLINICAL DATA:  Shortness of Breath EXAM: CHEST 1 VIEW COMPARISON:  December 07, 2014 and August 28, 2014 FINDINGS: Scattered areas of scarring are noted bilaterally. There is less interstitial edema compared to recent prior study. Trace edema is felt to remain. There is less fluid in the minor fissure region on the right compared to recent prior study. There is no new opacity. Cardiomegaly is stable. The pulmonary vascularity is within normal limits. No adenopathy. Prosthetic aortic valve present. There is atherosclerotic calcification in the aorta. IMPRESSION: Underlying parenchymal scarring bilaterally. There is now only trace edema. There is only minimal residual fluid in the right minor fissure. No new opacities. The appearance is consistent with partial resolution of a degree of congestive heart failure. Cardiomegaly is stable. Patient is status post aortic valve replacement. Electronically Signed   By: Bretta Bang III M.D.   On: 12/10/2014 07:22      Pulmonary Consult time - 35 mins  Stephanie Acre, MD [facility] Pager [phone] (please enter 7-digits) On Call Pager - [phone] (please enter 7-digits)

## 2014-12-11 NOTE — Plan of Care (Signed)
Problem: Education: Goal: Knowledge of [facility] Education information/materials will improve Outcome: Progressing Patient and family educated on treatment plan for shift.  Education provided on administration of Lovenox for anticoagulation therapy.    Problem: Safety: Goal: Ability to remain free from injury will improve Outcome: Progressing Patient with generalized weakness and inability to turn self.  Bed in lowest position with call bell within reach.  Bed alarm on throughout shift.  Patient free from injury.  Problem: Pain Managment: Goal: General experience of comfort will improve Outcome: Progressing Patient without complaints of pain this shift.  Problem: Respiratory: Goal: Ability to maintain adequate ventilation will improve Outcome: Progressing Patient on 6L of O2 with stats of 92%.  Respiratory administered bipap for sleep.

## 2014-12-11 NOTE — Plan of Care (Signed)
Problem: Education: Goal: Knowledge of [facility] Education information/materials will improve Outcome: Progressing Has received General Education Handout. Oriented to Oncology unit. Instructed and Demonstrated how to use phone to call RN and CNA for Assistance. Verbalized and Demonstrated Understanding.      Problem: Safety: Goal: Ability to remain free from injury will improve Outcome: Progressing High fall risk with bed alarm activated. Calls or waits for assistance. Remained free of injury of fall this shift.  Problem: Pain Managment: Goal: General experience of comfort will improve Outcome: Progressing Patient without complaints of pain this shift.  Problem: Physical Regulation: Goal: Signs and symptoms of infection will decrease Outcome: Progressing Remains on IV Antibiotics.  Problem: Respiratory: Goal: Ability to maintain adequate ventilation will improve Outcome: Progressing Vital Signs stable. Remains on 4L-6L O2 via Nasal Cannula. O2 sat 95%. Baseline.     Nocturnal BiPAP.       Encouraged cough and deep breathe with teach back. Unable to successfully demonstrate.

## 2014-12-11 NOTE — Progress Notes (Signed)
[facility] - [place] at [facility]   PATIENT NAME: Mindy Cook    MR#:  [id number]  DATE OF BIRTH:  06-07-1933  SUBJECTIVE:   Patient had her oxygen off and was complaining shortness of breath. Once the placement nasal cannula back she has improved symptoms. Son is at bedside. Patient appears to be at her baseline.  REVIEW OF SYSTEMS:    Review of Systems  Constitutional: Positive for malaise/fatigue. Negative for fever and chills.  HENT: Negative for sore throat.   Eyes: Negative for blurred vision.  Respiratory: Positive for cough and shortness of breath. Negative for hemoptysis and wheezing.   Cardiovascular: Negative for chest pain, palpitations and leg swelling.  Gastrointestinal: Negative for nausea, vomiting, abdominal pain, diarrhea and blood in stool.  Genitourinary: Negative for dysuria.  Musculoskeletal: Negative for back pain.  Neurological: Positive for weakness. Negative for dizziness, tremors and headaches.  Endo/Heme/Allergies: Does not bruise/bleed easily.    Tolerating Diet:yes      DRUG ALLERGIES:   Allergies  Allergen Reactions  . Hydrocodone-Acetaminophen Nausea Only    VITALS:  Blood pressure 144/52, pulse 77, temperature 97.8 F (36.6 C), temperature source Oral, resp. rate 18, height  (1.6 m), weight 81.8 kg (180 lb 5.4 oz), SpO2 99 %.  PHYSICAL EXAMINATION:   Physical Exam    LABORATORY PANEL:   CBC  Recent Labs Lab 12/11/14 0436  WBC 8.0  HGB 11.0*  HCT 34.4*  PLT 144*   ------------------------------------------------------------------------------------------------------------------  Chemistries   Recent Labs Lab 12/06/14 0702  12/08/14 1307  12/11/14 0436  NA 139  < >  --   < > 143  K 6.0*  < >  --   < > 3.6  CL 92*  < >  --   < > 98*  CO2 44*  < >  --   < > 40*  GLUCOSE 287*  < >  --   < > 146*  BUN 29*  < >  --   < > 25*  CREATININE 0.94  < >  --   < > 0.72  CALCIUM 10.7*  < >   --   < > 9.6  MG  --   --  1.9  --   --   AST 18  --   --   --   --   ALT 13*  --   --   --   --   ALKPHOS 59  --   --   --   --   BILITOT 0.7  --   --   --   --   < > = values in this interval not displayed. ------------------------------------------------------------------------------------------------------------------  Cardiac Enzymes  Recent Labs Lab 12/06/14 0702  TROPONINI 0.04*   ------------------------------------------------------------------------------------------------------------------  RADIOLOGY:  Dg Chest 1 View  12/10/2014  CLINICAL DATA:  Shortness of Breath EXAM: CHEST 1 VIEW COMPARISON:  December 07, 2014 and August 28, 2014 FINDINGS: Scattered areas of scarring are noted bilaterally. There is less interstitial edema compared to recent prior study. Trace edema is felt to remain. There is less fluid in the minor fissure region on the right compared to recent prior study. There is no new opacity. Cardiomegaly is stable. The pulmonary vascularity is within normal limits. No adenopathy. Prosthetic aortic valve present. There is atherosclerotic calcification in the aorta. IMPRESSION: Underlying parenchymal scarring bilaterally. There is now only trace edema. There is only minimal residual fluid in the right minor fissure. No new  opacities. The appearance is consistent with partial resolution of a degree of congestive heart failure. Cardiomegaly is stable. Patient is status post aortic valve replacement. Electronically Signed   By: Bretta BangWilliam  Woodruff III M.D.   On: 12/10/2014 07:22     ASSESSMENT AND PLAN:   81 year old female who presents with acute hypoxic respiratory failure.  1. Acute hypoxic respiratory failure on chronic hypercapnic respiratory failure: Patient is currently on 4 L of oxygen her baseline is 2-3 L. Acute hypoxic respiratory failure was thought to be secondary to pneumonia. Continue to wean down oxygen as tolerated. Continue doxycycline for aspiration  pneumonia as per pulmonary consultation.  2. Metabolic encephalopathy: This is secondary sepsis which is resolved.  3. Sepsis: This is secondary to ESBL urinary tract infection as well as aspiration pneumonia. As per her sensitivities patient may be changed from ertapenem to ciprofloxacin. Continue doxycycline. I would treat for a total of 10 days.  4. Hypokalemia: Potassium has been repleted. Restart by mouth potassium replacements.  5. Type 2 diabetes: Continue sliding scale insulin. Once her by mouth intake has improved I may restart her outpatient medications.  6. Chronic diastolic heart failure: Continue metolazone and Lasix.  7. History of COPD: No signs of exacerbation at this time.  8. Bradycardia: Resolved.  9. Restless leg syndrome: Continue Requip  10. Generalized weakness: Patient has been in bed bound status for several months. Physical therapy was consulted initially but because she was in the CCU she did not obtain physical therapy consultation. I will ask for physical therapy to reevaluate patient.      Management plans discussed with the patient and son and they are in agreement.  CODE STATUS: DO NOT RESUSCITATE  TOTAL TIME TAKING CARE OF THIS PATIENT: 30 minutes.     POSSIBLE D/C tomorrow, DEPENDING ON CLINICAL CONDITION.   Stephanny Tsutsui M.D on 12/11/2014 at 11:37 AM  Between 7am to 6pm - Pager - [phone] After 6pm go to [email or web address] - password [email or web address] [facility]  Office  [phone]  CC: Primary care physician; Patrice ParadiseMCLAUGHLIN, [name], MD  Note: This dictation was prepared with Dragon dictation along with smaller phrase technology. Any transcriptional errors that result from this process are unintentional.

## 2014-12-11 NOTE — Progress Notes (Signed)
Palliative Care Update   Palliative Care Consult initiated.  Chart reviewed. Further involvement and full note to follow.    Suan HalterMargaret F Jacqueline Delapena, MD

## 2014-12-11 NOTE — Progress Notes (Signed)
ANTIBIOTIC CONSULT NOTE - INITIAL  Pharmacy Consult for ciprofloxacin Indication: ESBL-positive E. Coli UTI  Allergies  Allergen Reactions  . Hydrocodone-Acetaminophen Nausea Only    Patient Measurements: Height: 5\' 3"  (160 cm) Weight: 180 lb 5.4 oz (81.8 kg) IBW/kg (Calculated) : 52.4  Vital Signs: Temp: 97.7 F (36.5 C) (12/01 0536) Temp Source: Oral (12/01 0536) BP: 138/46 mmHg (12/01 0536) Pulse Rate: 60 (12/01 0536) Intake/Output from previous day: 11/30 0701 - 12/01 0700 In: 480 [P.O.:180; IV Piggyback:300] Out: 4 [Urine:4] Intake/Output from this shift:    Labs:  Recent Labs  12/09/14 1257 12/10/14 0437 12/11/14 0436  WBC  --  9.6 8.0  HGB  --  11.0* 11.0*  PLT  --  152 144*  CREATININE 0.72 0.76 0.72   Estimated Creatinine Clearance: 57.8 mL/min (by C-G formula based on Cr of 0.72). No results for input(s): VANCOTROUGH, VANCOPEAK, VANCORANDOM, GENTTROUGH, GENTPEAK, GENTRANDOM, TOBRATROUGH, TOBRAPEAK, TOBRARND, AMIKACINPEAK, AMIKACINTROU, AMIKACIN in the last 72 hours.   Microbiology: Recent Results (from the past 720 hour(s))  Culture, blood (routine x 2)     Status: None   Collection Time: 12/06/14  7:02 AM  Result Value Ref Range Status   Specimen Description BLOOD LEFT AC  Final   Special Requests BOTTLES DRAWN AEROBIC AND ANAEROBIC  8CC  Final   Culture  Setup Time   Final    GRAM POSITIVE COCCI AEROBIC BOTTLE ONLY CRITICAL RESULT CALLED TO, READ BACK BY AND VERIFIED WITH: PAM RUMLEY @ 2132 ON 12/07/2014 BY [facility] CONFIRMED BY PAULA Jahnessa Vanduyn    Culture   Final    COAGULASE NEGATIVE STAPHYLOCOCCUS AEROBIC BOTTLE ONLY Results consistent with contamination.    Report Status 12/11/2014 FINAL  Final  Culture, blood (routine x 2)     Status: None (Preliminary result)   Collection Time: 12/06/14  7:02 AM  Result Value Ref Range Status   Specimen Description BLOOD RIGHT HAND  Final   Special Requests BOTTLES DRAWN AEROBIC AND ANAEROBIC  3CC  Final   Culture NO GROWTH 4 DAYS  Final   Report Status PENDING  Incomplete  Urine culture     Status: None   Collection Time: 12/06/14  7:51 AM  Result Value Ref Range Status   Specimen Description URINE, RANDOM  Final   Special Requests NONE  Final   Culture   Final    >=100,000 COLONIES/mL ESCHERICHIA COLI Results Called to: AMELIA BERRY AT 1030 ON 12/08/14 CTJ    Report Status 12/08/2014 FINAL  Final   Organism ID, Bacteria ESCHERICHIA COLI  Final      Susceptibility   Escherichia coli - MIC*    AMPICILLIN >=32 RESISTANT Resistant     CEFTAZIDIME 16 RESISTANT Resistant     CEFAZOLIN >=64 RESISTANT Resistant     CEFTRIAXONE >=64 RESISTANT Resistant     GENTAMICIN <=1 SENSITIVE Sensitive     IMIPENEM <=0.25 SENSITIVE Sensitive     TRIMETH/SULFA <=20 SENSITIVE Sensitive     Extended ESBL POSITIVE Resistant     CIPROFLOXACIN Value in next row Sensitive      SENSITIVE<=0.25    NITROFURANTOIN Value in next row Resistant      RESISTANT>=512    PIP/TAZO Value in next row Sensitive      SENSITIVE<=4    * >=100,000 COLONIES/mL ESCHERICHIA COLI  MRSA PCR Screening     Status: None   Collection Time: 12/06/14 11:31 AM  Result Value Ref Range Status   MRSA by PCR NEGATIVE NEGATIVE Final  Comment:        The GeneXpert MRSA Assay (FDA approved for NASAL specimens only), is one component of a comprehensive MRSA colonization surveillance program. It is not intended to diagnose MRSA infection nor to guide or monitor treatment for MRSA infections.     Medical History: Past Medical History  Diagnosis Date  . Asthma   . A-fib (HCC)   . Arthritis   . Aortic stenosis   . Aortic insufficiency   . Hyperlipidemia   . Hypertension   . Sleep apnea   . CHF (congestive heart failure) (HCC)   . History of DVT (deep vein thrombosis)   . Diabetes mellitus type 2, uncontrolled (HCC)   . COPD (chronic obstructive pulmonary disease) (HCC)   . Cervical spondylolysis   . DDD (degenerative disc  disease), cervical   . Anemia   . Mitral valve disorder   . Anxiety   . Chronic gout   . Restless leg syndrome   . Osteoarthritis     Medications:  Anti-infectives    Start     Dose/Rate Route Frequency Ordered Stop   12/11/14 1000  ciprofloxacin (CIPRO) IVPB 400 mg     400 mg 200 mL/hr over 60 Minutes Intravenous Every 12 hours 12/11/14 0733     12/09/14 2300  doxycycline (VIBRAMYCIN) 100 mg in dextrose 5 % 250 mL IVPB     100 mg 125 mL/hr over 120 Minutes Intravenous Every 12 hours 12/09/14 2218     12/09/14 2200  doxycycline (VIBRA-TABS) tablet 100 mg  Status:  Discontinued     100 mg Oral Every 12 hours 12/09/14 1643 12/09/14 2218   12/08/14 1145  ertapenem (INVANZ) 1 g in sodium chloride 0.9 % 50 mL IVPB  Status:  Discontinued     1 g 100 mL/hr over 30 Minutes Intravenous Every 24 hours 12/08/14 1141 12/11/14 0702   12/06/14 1700  vancomycin (VANCOCIN) 1,250 mg in sodium chloride 0.9 % 250 mL IVPB  Status:  Discontinued     1,250 mg 166.7 mL/hr over 90 Minutes Intravenous Every 24 hours 12/06/14 0844 12/08/14 1141   12/06/14 1400  piperacillin-tazobactam (ZOSYN) IVPB 3.375 g  Status:  Discontinued     3.375 g 12.5 mL/hr over 240 Minutes Intravenous 3 times per day 12/06/14 0844 12/08/14 1141   12/06/14 1000  vancomycin (VANCOCIN) IVPB 1000 mg/200 mL premix  Status:  Discontinued     1,000 mg 200 mL/hr over 60 Minutes Intravenous Every 24 hours 12/06/14 0948 12/08/14 1141   12/06/14 0715  piperacillin-tazobactam (ZOSYN) IVPB 3.375 g     3.375 g 100 mL/hr over 30 Minutes Intravenous  Once 12/06/14 0714 12/06/14 0830   12/06/14 0715  vancomycin (VANCOCIN) IVPB 1000 mg/200 mL premix     1,000 mg 200 mL/hr over 60 Minutes Intravenous  Once 12/06/14 1610 12/06/14 0945     Assessment: Pharmacy consulted to dose ciprofloxacin in this 81 year old female who is being treated for an ESBL-positive E. Coli UTI which is sensitive to ciprofloxacin. This is currently day #5 of  appropriate antibiotics. Patient was previously on both piperacillin/tazobactam and ertapenem, both of which were sensitive to the E.coli.   CrCl = 57 mL/min  Plan:  Will start ciprofloxacin 400 mg IV every 12 hours based on renal function and indication. Pharmacy will continue to monitor, thank you for the consult!  Jodelle Red Haniya Fern 12/11/2014,7:34 AM

## 2014-12-11 NOTE — Clinical Social Work Note (Signed)
[facility] and [facility]. At time of note, [facility] does not have a private room available for pt. However, they are going to work on make accommodations for pt. CSW will continue to follow.   Dede QuerySarah Sye Schroepfer, MSW, LCSW Clinical Social Worker  [phone]

## 2014-12-11 NOTE — Care Management Important Message (Signed)
Important Message  Patient Details  Name: Mindy Cook MRN: [id number] Date of Birth: 05-10-1933   Medicare Important Message Given:  Yes    Gwenette GreetBrenda S Amilio Zehnder, RN 12/11/2014, 11:24 AM

## 2014-12-11 NOTE — Evaluation (Signed)
Occupational Therapy Evaluation Patient Details Name: Mindy Cook MRN: [id number] DOB: [birth year] Today's Date: 12/11/2014    History of Present Illness Pt presented to ER secondary to increased lethargy, decreased O2 sats (70s upon arrival) and fever; admitted with AMS, respiratory distress related to sepsis secondary to UTI, PNA.  Pt transferred to CCU 12/09/14 d/t acute hypoxic respiratory failure with chronic hypercapnic respiratory failure.  Pt on 2-3 L/min O2 at home chronic.  New PT order received for re-eval after transfer out of CCU.   Clinical Impression   Pt presents with total assist for ADLs including self feeding.  She is lethargic and has difficulty attending to task being assessed but with max cues and tactile stimulation she is able to participate.  She is R hand dominant and has pain in L shoulder from fall and does not demonstrate any active grasp or UE AROM due to pain.  She has 3+/5 in R hand but is very de-conditioned overall with generalized weakness. She was agreeable to working on self feeding goals and rec using a built up foam handle to increase grasp on utensil.  Rec continued OT minimum of 1 time a week to address self feeding, grooming skills and upper body dressing.  Rec return to [facility] LTC if level of participation remains the same, otherwise rec SNF if pt is agreeable to working on ADLs and functional mobility.  (Pt declined participation in PT).      Follow Up Recommendations  SNF    Equipment Recommendations       Recommendations for Other Services       Precautions / Restrictions Precautions Precautions: Fall Restrictions Weight Bearing Restrictions: No      Mobility Bed Mobility Overal bed mobility: Needs Assistance Bed Mobility: Rolling Rolling: Max assist;+2 for physical assistance (nursing logrolling pt for clean-up in bed)            Transfers                 General transfer comment: pt reports being hoyer lift past few  months    Balance                                            ADL Overall ADL's : Needs assistance/impaired                                       General ADL Comments: Pt is currently total assist for all ADLs including self feeding.  [name] Mindy Cook stated that up until recently she was able to get up with FWW and get dressed and walk around Independently as long as she had her walker. Mindy Cook stated she has not had any therapy since Aug/Sept at [facility]. She has gout which flares up and she won't stand or put any weight in LEs which is what happened at [facility] before gong to [place].     Vision     Perception     Praxis      Pertinent Vitals/Pain Pain Assessment: Faces Faces Pain Scale: Hurts even more Pain Location: L shoulder Pain Descriptors / Indicators: Constant;Aching Pain Intervention(s): Limited activity within patient's tolerance;Monitored during session     Hand Dominance Right   Extremity/Trunk Assessment Upper Extremity Assessment Upper Extremity Assessment: LUE deficits/detail;Generalized  weakness;RUE deficits/detail RUE Deficits / Details: Pt able to squeeze hand but full MMT not performed due to decrased LOA and lethargy.  AROM to 45 degrees with RUE and hand with full grasp and release and strength in hand 3+/5 LUE Deficits / Details: pain in L shoulder from fall and not active grasp elicited and pt with increased pain with PROM in LUE to 45 degrees. LUE: Unable to fully assess due to pain   Lower Extremity Assessment Lower Extremity Assessment: Defer to PT evaluation       Communication Communication Communication: No difficulties   Cognition Arousal/Alertness: Lethargic Behavior During Therapy: Flat affect Overall Cognitive Status: Within Functional Limits for tasks assessed                     General Comments       Exercises       Shoulder Instructions      Home Living Family/patient  expects to be discharged to:: Other (Comment)                                 Additional [facility]      Prior Functioning/Environment Level of Independence: Needs assistance  Gait / Transfers Assistance Needed: Pt reports being hoyer lift to w/c and gets pushed in w/c. ADL's / Homemaking Assistance Needed: Pt reports needing assist with toileting (gets cleaned-up in bed), bathing, meals.   Comments: SW contacted [facility] who reports pt is no longer receiving PT at facility and is in long-term care section.    OT Diagnosis: Generalized weakness;Acute pain   OT Problem List: Decreased strength;Decreased range of motion;Decreased activity tolerance;Pain   OT Treatment/Interventions: Self-care/ADL training;DME and/or AE instruction;Patient/family education    OT Goals(Current goals can be found in the care plan section) Acute Rehab OT Goals Patient Stated Goal: [facility] Goal Formulation: With patient/family Time For Goal Achievement: 12/25/14 Potential to Achieve Goals: Fair ADL Goals Pt Will Perform Eating: with mod assist;with adaptive utensils;bed level (using foam utensil holder with RUE and hand) Pt Will Perform Grooming: with mod assist;with adaptive equipment;bed level Pt Will Perform Upper Body Dressing: with mod assist;bed level  OT Frequency: Min 1X/week   Barriers to D/C:            Co-evaluation              End of Session    Activity Tolerance: Patient limited by fatigue;Patient limited by lethargy Patient left: in bed;with call bell/phone within reach;with bed alarm set;with family/visitor present   Time: 1600-1630 OT Time Calculation (min): 30 min Charges:  OT General Charges $OT Visit: 1 Procedure OT Evaluation $Initial OT Evaluation Tier I: 1 Procedure OT Treatments $Self Care/Home Management : 8-22 mins G-Codes:    Dlisa Barnwell 12/11/2014, 5:47 PM    Susanne BordersSusan Ravis Herne, OTR/L

## 2014-12-11 NOTE — Consult Note (Signed)
Palliative Medicine Inpatient Consult Note   Name: Mindy Cook Date: 12/11/2014 MRN: [id number]  DOB: [birth year]  Referring Physician: Bettey Costa, MD  Palliative Care consult requested for this 81 y.o. female for goals of medical therapy in patient who resides at [facility] with acute on chronic respiratory failure and other problems as outlined below under 'Impression'.   Pt had to be transferred to ICU on Nov 29th due to worsening respiratory failure.  She was made DNR during a discussion between  [facility] MD and pt's son while in ICU on Nov 30th.  She has stabilized and is now on 1C and a pallaitive care consult for goals of care is requested.  Pt had also been asked to see pt and I spoke with PT about pt's lack of participation in therapy and her months' long bedbound status. Social Work notes refer to pt returning to [facility]      TODAY'S DISCUSSIONS AND DECISIONS:  1.  Pt is DNR status and portable form is placed in the paper chart today.  2.  I met and spoke with pt's daughter today and mentioned the option of having HOSPICE follow pt in [place] when pt returns there. Daughter, Mindy Cook, was in favor of this but would like for her dad and brother to hear more about this option.  I can't meet with them tonight (son to come at night) BUT I can meet with them tomorrow evening --unless pt is to be Discharged tomorrow.  I will see pt early and try to figure out the plans for pt and call family then about talking further about whether or not they wish to have Hospice follow pt in the facility. She would likely meet under respiratory diagnosis.  I would think her decreased oral intake is a factor to consider also, but her most recent albumin is actually fairly robust at 3.9 --indicating a bettter appetite recently than she has here.  She is not imminently dying, but I think would qualify for Hospice in the long term bed at the SNF.    3.  I do not think she is a rehab candidate, as she has been  bedbound for 3-4 months per daughter.  Of course, if someone finds a way to enable skilled rehab days, then I would recommend a Palliative Care consult in the SNF during those days so that she could then be considered for Hospice in the facility at the time that rehab ends.      IMPRESSION: 1. Acute Hypoxic Respiratory Failure on Chronic Hypercapneic Respiratory Failure ---O2 sats 76% on 2 LPM O2 at admission ---Chronically dependent on home oxygen at 2 LPM ---Due to  ASPIRATION PNEUMONIA plus COPD ---Was on BIPAP --had trouble due to confusion and kept pulling this off.   2.  Sepsis due to ESBL UTI AND also due to ASPIRATION PNEUMONIA ---present at admission  ---treated with various ABX 3. Hyperkalemia and hypokalemia ---both treated 4. DM-2 5.  Metabolic encephalopathy --resolved 6.  Bradycardia --resolved 7.  Weakness  ---pt may not be able to participate with PT ---bedbound for several months and does very little for self 8.  Restless Leg Syndrome --on Requip 9.  Inadequate Oral intake related to acute and chronic illnesses and loss of appetite --- with < 50% consumed ---refusal to eat recently and not cooperative with ST eval on Nov 29th.   10. Dyspghagia  And also Loss of APPETITE due to dementia and aging and illnesses --on pureed with Honey thick diet  11.  COPD ---no exacerbation but to continue nebs when needed 12.  AFib w/ RVR --rate controlled now 13.  PVCs on tele at times 14.  Hypophosphatemia 15.  Hyperlipidemia 16.  Anxiety 17.  Gout 18.  Anemia 19.  Cervical Spondylosis 20.  DJD and DDD 21.  Obstructive Sleep Apnea 22. Acute Diastolic CHF here and also H/O CHF (type not specified in record) 23.  H/O AV stenosis treated with TAVR (AVR via distal catheter --the reason why she has no chest scar).  ----there is no aortic stenosis on echo in record from 08/29/14 which was after the TAVR in 2014 ----echo then showed Mod MR and EF 55-60%   (NOTE THAT THE TAVR IS NOT  LISTED IN HER HISTORY BUT SHE HAD IT IN 2014).  24.  Dementia  ---is at times oriented to self and place but often nonverbal or nearly so ---resistant to movement and dependent for all transfers and bed mobility 25. Mild elevation of troponin --due to demand ischemia and not an MI   REVIEW OF SYSTEMS:  Patient is not able to provide ROS in detail due to confusion.   SPIRITUAL SUPPORT SYSTEM: Yes--family. Son was spoken to by chaplain,  Percival Spanish, about advance directives. Son thinks pt may have already discussed this or completed Adv Directives and will discuss with his father (pt's husband).  SOCIAL HISTORY:  reports that she has never smoked. She does not have any smokeless tobacco history on file. She reports that she does not drink alcohol or use illicit drugs.  Pt has a husband who is the Optician, dispensing.  Pt has an adult son and an adult daughter. Daughter says the three of them together make decisions.  Pt resides at [facility] and is there currently under Medicaid coverage --but they have to pay extra since the Medicaid money does not cover the entire monthly fee.    LEGAL DOCUMENTS:  I completed and placed a portable DNR form in the paper chart.   CODE STATUS: DNR status as of last evening.   PAST MEDICAL HISTORY: Past Medical History  Diagnosis Date  . Asthma   . A-fib ([place])   . Arthritis   . Aortic stenosis   . Aortic insufficiency   . Hyperlipidemia   . Hypertension   . Sleep apnea   . CHF (congestive heart failure) ([place])   . History of DVT (deep vein thrombosis)   . Diabetes mellitus type 2, uncontrolled ([place])   . COPD (chronic obstructive pulmonary disease) ([place])   . Cervical spondylolysis   . DDD (degenerative disc disease), cervical   . Anemia   . Mitral valve disorder   . Anxiety   . Chronic gout   . Restless leg syndrome   . Osteoarthritis     PAST SURGICAL HISTORY:  Past Surgical History  Procedure Laterality Date  . Abdominal hysterectomy     . Cardiac catheterization    . Colonoscopy      ALLERGIES:  is allergic to hydrocodone-acetaminophen.  MEDICATIONS:  Current Facility-Administered Medications  Medication Dose Route Frequency Provider Last Rate Last Dose  . allopurinol (ZYLOPRIM) tablet 100 mg  100 mg Oral Daily Bettey Costa, MD   100 mg at 12/11/14 1223  . antiseptic oral rinse (CPC / CETYLPYRIDINIUM CHLORIDE 0.05%) solution 7 mL  7 mL Mouth Rinse q12n4p Epifanio Lesches, MD   7 mL at 12/10/14 1600  . carvedilol (COREG) tablet 3.125 mg  3.125 mg Oral BID Bettey Costa, MD  3.125 mg at 12/11/14 1223  . chlorhexidine (PERIDEX) 0.12 % solution 15 mL  15 mL Mouth Rinse BID Epifanio Lesches, MD   15 mL at 12/11/14 1226  . ciprofloxacin (CIPRO) IVPB 400 mg  400 mg Intravenous Q12H Lenis Noon, RPH   400 mg at 12/11/14 1000  . digoxin (LANOXIN) tablet 0.0625 mg  0.0625 mg Oral Daily Sital Mody, MD   0.0625 mg at 12/11/14 1224  . docusate sodium (COLACE) capsule 100 mg  100 mg Oral BID Epifanio Lesches, MD   100 mg at 12/11/14 1223  . doxycycline (VIBRAMYCIN) 100 mg in dextrose 5 % 250 mL IVPB  100 mg Intravenous Q12H Anders Simmonds, MD   100 mg at 12/11/14 1100  . enoxaparin (LOVENOX) injection 40 mg  40 mg Subcutaneous Q24H Epifanio Lesches, MD   40 mg at 12/11/14 1248  . ferrous sulfate tablet 325 mg  325 mg Oral Daily Sital Mody, MD   325 mg at 12/11/14 1223  . furosemide (LASIX) tablet 20 mg  20 mg Oral Daily Bettey Costa, MD   20 mg at 12/11/14 1223  . gabapentin (NEURONTIN) capsule 300 mg  300 mg Oral QHS Sital Mody, MD      . hydrALAZINE (APRESOLINE) injection 10 mg  10 mg Intravenous Q6H PRN Vaughan Basta, MD   10 mg at 12/08/14 0535  . insulin aspart (novoLOG) injection 0-9 Units  0-9 Units Subcutaneous TID WC Epifanio Lesches, MD   2 Units at 12/11/14 1227  . ipratropium-albuterol (DUONEB) 0.5-2.5 (3) MG/3ML nebulizer solution 3 mL  3 mL Nebulization Q6H PRN Epifanio Lesches, MD      . metolazone  (ZAROXOLYN) tablet 5 mg  5 mg Oral Once per day on Mon Wed Fri Epifanio Lesches, MD   5 mg at 12/10/14 0816  . pneumococcal 13-valent conjugate vaccine (PREVNAR 13) injection 0.5 mL  0.5 mL Intramuscular Prior to discharge Epifanio Lesches, MD      . potassium chloride SA (K-DUR,KLOR-CON) CR tablet 20 mEq  20 mEq Oral BID Bettey Costa, MD   20 mEq at 12/11/14 1223  . pravastatin (PRAVACHOL) tablet 40 mg  40 mg Oral QHS Sital Mody, MD      . rOPINIRole (REQUIP) tablet 0.25 mg  0.25 mg Oral QHS Bettey Costa, MD      . senna (SENOKOT) tablet 8.6 mg  1 tablet Oral QHS PRN Epifanio Lesches, MD      . sodium chloride 0.9 % injection 3 mL  3 mL Intravenous Q12H Epifanio Lesches, MD   3 mL at 12/11/14 1000    Vital Signs: BP 112/58 mmHg  Pulse 75  Temp(Src) 97.4 F (36.3 C) (Oral)  Resp 20  Ht 5' 3"  (1.6 m)  Wt 81.8 kg (180 lb 5.4 oz)  BMI 31.95 kg/m2  SpO2 100% Filed Weights   12/06/14 0654 12/09/14 1600  Weight: 81.965 kg (180 lb 11.2 oz) 81.8 kg (180 lb 5.4 oz)    Estimated body mass index is 31.95 kg/(m^2) as calculated from the following:   Height as of this encounter: 5' 3"  (1.6 m).   Weight as of this encounter: 81.8 kg (180 lb 5.4 oz).  PERFORMANCE STATUS (ECOG) : 4 - Bedbound  PHYSICAL EXAM: NAD Weak appearing in medical bed on telemetry EOMI Glasses Nares patent No JVD or Tm hrt Irreg irreg Lungs with some rare ronchi but otw cta Abd soft and NT Skin warm and dry Alert and says "no I don't want  that' when asked about a feeding tube.   LABS: CBC:    Component Value Date/Time   WBC 8.0 12/11/2014 0436   WBC 9.8 03/31/2013 0608   HGB 11.0* 12/11/2014 0436   HGB 10.7* 03/31/2013 0608   HCT 34.4* 12/11/2014 0436   HCT 32.4* 03/31/2013 0608   PLT 144* 12/11/2014 0436   PLT 133* 03/31/2013 0608   MCV 94.6 12/11/2014 0436   MCV 93 03/31/2013 0608   NEUTROABS 7.8* 12/10/2014 0437   NEUTROABS 8.5* 01/10/2012 0704   LYMPHSABS 0.9* 12/10/2014 0437   LYMPHSABS  0.3* 01/10/2012 0704   MONOABS 0.8 12/10/2014 0437   MONOABS 0.2 01/10/2012 0704   EOSABS 0.1 12/10/2014 0437   EOSABS 0.0 01/10/2012 0704   BASOSABS 0.0 12/10/2014 0437   BASOSABS 0.0 01/10/2012 0704   Comprehensive Metabolic Panel:    Component Value Date/Time   NA 143 12/11/2014 0436   NA 135* 03/31/2013 0608   K 3.6 12/11/2014 0436   K 4.0 03/31/2013 0608   CL 98* 12/11/2014 0436   CL 101 03/31/2013 0608   CO2 40* 12/11/2014 0436   CO2 32 03/31/2013 0608   BUN 25* 12/11/2014 0436   BUN 26* 03/31/2013 0608   CREATININE 0.72 12/11/2014 0436   CREATININE 1.62* 03/31/2013 0608   GLUCOSE 146* 12/11/2014 0436   GLUCOSE 180* 03/31/2013 0608   CALCIUM 9.6 12/11/2014 0436   CALCIUM 9.4 03/31/2013 0608   AST 18 12/06/2014 0702   AST 35 01/09/2012 0416   ALT 13* 12/06/2014 0702   ALT 28 01/09/2012 0416   ALKPHOS 59 12/06/2014 0702   ALKPHOS 76 01/09/2012 0416   BILITOT 0.7 12/06/2014 0702   BILITOT 0.3 01/09/2012 0416   PROT 7.7 12/06/2014 0702   PROT 6.3* 01/09/2012 0416   ALBUMIN 3.9 12/06/2014 0702   ALBUMIN 2.9* 01/09/2012 0416    More than 50% of the visit was spent in counseling/coordination of care: Yes  Time Spent:   minutes

## 2014-12-11 NOTE — Progress Notes (Signed)
Went over AD with son.Son stated mother may have completed an  AD and will give/discuss AD with father. Mindy JamesMelvin Jerene Cook, Chaplain [phone]

## 2014-12-12 ENCOUNTER — Encounter
Admission: RE | Admit: 2014-12-12 | Discharge: 2014-12-12 | Disposition: A | Discharge status: Home / Self Care | Source: Ambulatory Visit | Attending: Internal Medicine | Admitting: Internal Medicine

## 2014-12-12 DIAGNOSIS — E119 Type 2 diabetes mellitus without complications: Secondary | ICD-10-CM | POA: Insufficient documentation

## 2014-12-12 LAB — BASIC METABOLIC PANEL
Anion gap: 3 — ABNORMAL LOW (ref 5–15)
BUN: 25 mg/dL — AB (ref 6–20)
CALCIUM: 9.8 mg/dL (ref 8.9–10.3)
CHLORIDE: 97 mmol/L — AB (ref 101–111)
CO2: 42 mmol/L — ABNORMAL HIGH (ref 22–32)
CREATININE: 0.73 mg/dL (ref 0.44–1.00)
GFR calc non Af Amer: >60 mL/min (ref >60)
Glucose, Bld: 138 mg/dL — ABNORMAL HIGH (ref 65–99)
Potassium: 4 mmol/L (ref 3.5–5.1)
SODIUM: 142 mmol/L (ref 135–145)

## 2014-12-12 LAB — GLUCOSE, CAPILLARY
GLUCOSE-CAPILLARY: 116 mg/dL — AB (ref 65–99)
GLUCOSE-CAPILLARY: 126 mg/dL — AB (ref 65–99)
Glucose-Capillary: 252 mg/dL — ABNORMAL HIGH (ref 65–99)
Glucose-Capillary: 160 mg/dL — ABNORMAL HIGH (ref 65–99)

## 2014-12-12 MED ORDER — CIPROFLOXACIN HCL 500 MG PO TABS: 500.0000 mg | ORAL_TABLET | Freq: Two times a day (BID) | ORAL | Status: AC

## 2014-12-12 MED ORDER — DOXYCYCLINE HYCLATE 50 MG PO CAPS
100.0000 mg | ORAL_CAPSULE | Freq: Two times a day (BID) | ORAL | Status: AC
Start: 2014-12-12 — End: 2014-12-17

## 2014-12-12 NOTE — Progress Notes (Signed)
Palliative Care Consult  [facility].  Discussed w/ son and he agrees.    See full note to follow.    [facility] for short period of time tomorrow, 12/13/14.   Suan HalterMargaret F Kody Vigil, MD

## 2014-12-12 NOTE — Progress Notes (Signed)
Spoke with Derryl HarborLonnie, [facility] at [phone], to notify of non-emergent EMS transport.  Auth notification reference given as [id number].   Service date range good from 12/12/14 - 03/12/15.   Gap exception requested to determine if services can be considered at an in-network level.

## 2014-12-12 NOTE — Clinical Social Work Note (Signed)
Pt is for discharge tomorrow (12/3/216), when private rool is available. Information has been sent to facility. Pt and son are aware and agreeable to discharge plan. Weekend CSW will facilitate discharge.   Dede QuerySarah Valgene Deloatch, MSW, LCSW Clinical Social Worker  [phone]

## 2014-12-12 NOTE — Plan of Care (Signed)
Problem: Physical Regulation: Goal: Diagnostic test results will improve Outcome: Progressing Pt has been resting comfortably during shift. Pt denies pain. Pt still on BiPAP at nights. No other signs of distress noted. Will continue to monitor.

## 2014-12-12 NOTE — Progress Notes (Signed)
Occupational Therapy Treatment Patient Details Name: Mindy GuysShelby J Vincelette MRN: [id number] DOB: 09-29-1933 Today's Date: 12/12/2014    History of present illness Pt presented to ER secondary to increased lethargy, decreased O2 sats (70s upon arrival) and fever; admitted with AMS, respiratory distress related to sepsis secondary to UTI, PNA.  Pt transferred to CCU 12/09/14 d/t acute hypoxic respiratory failure with chronic hypercapnic respiratory failure.  Pt on 2-3 L/min O2 at home chronic.  New PT order received for re-eval after transfer out of CCU.   OT comments  Pt seen for feeding skills training using foam utensil holder and took 3 small spoonfuls of thickened orange juice with min assist and mod cues and discussed other feeding equipment that would help with self feeding and left a catalog for patient's daughter to review.  Will check back to talk to daughter about rec when she visits later today.  [facility]. Placed LUE and hand on rolled blanket to decrease edema and let NSG know her hospital ID band is tight and needs to be placed on other wrist.  Pt much more alert with better participation today.  Follow Up Recommendations  SNF    Equipment Recommendations   (rec dycem, built up plate and bowl and curved spoon for self feeding)    Recommendations for Other Services      Precautions / Restrictions Precautions Precautions: Fall Restrictions Weight Bearing Restrictions: No       Mobility Bed Mobility                  Transfers                      Balance                                   ADL Overall ADL's : Needs assistance/impaired Eating/Feeding: Set up;Moderate assistance;With adaptive utensils Eating/Feeding Details (indicate cue type and reason): using foam utensil holder                                   General ADL Comments: Pt seen for feeding skills training using foam utensil holder and took  3 small spoonfuls of thickened orange juice with min assist and mod cues and discussed other feeding equipment that would help with self feeding and left a catalog for patient's daughter to review.  Will check back to talk to daughter about rec when she visits later today.  [facility].       Vision                     Perception     Praxis      Cognition                             Extremity/Trunk Assessment               Exercises     Shoulder Instructions       General Comments      Pertinent Vitals/ Pain          Home Living  Prior Functioning/Environment              Frequency Min 1X/week     Progress Toward Goals  OT Goals(current goals can now be found in the care plan section)  Progress towards OT goals: Progressing toward goals  Acute Rehab OT Goals Patient Stated Goal: to go back to [facility] OT Goal Formulation: With patient Time For Goal Achievement: 12/25/14 Potential to Achieve Goals: Fair  Plan Discharge plan remains appropriate    Co-evaluation                 End of Session Equipment Utilized During Treatment:  (foam utensil holder and adaptive equip catalog)   Activity Tolerance Patient tolerated treatment well   Patient Left in bed;with call bell/phone within reach;with bed alarm set   Nurse Communication  (about foam utensil holder and CNA about encouraging pt to feed selfwith foam utensil holder)        Time: 1135-1200 OT Time Calculation (min): 25 min  Charges: OT General Charges $OT Visit: 1 Procedure OT Treatments $Self Care/Home Management : 23-37 mins  Wofford,Susan 12/12/2014, 1:23 PM    Susanne Borders, OTR/L

## 2014-12-12 NOTE — Plan of Care (Signed)
Problem: Education: Goal: Knowledge of [facility] Education information/materials will improve Outcome: Progressing Has received General Education Handout. Oriented to Oncology unit. Instructed and Demonstrated how to use phone to call RN and CNA for Assistance. Verbalized and Demonstrated Understanding.      Problem: Safety: Goal: Ability to remain free from injury will improve Outcome: Progressing High fall risk with bed alarm activated. Instructed and Demonstrated how to use phone to call RN and CNA for Assistance. Verbalized and Demonstrated Understanding Calls or waits for assistance. Remained free of injury of fall this shift.     Problem: Pain Managment: Goal: General experience of comfort will improve Outcome: Progressing Patient without complaints of pain this shift. Remains on IV Antibiotics.  Problem: Fluid Volume: Goal: Hemodynamic stability will improve Outcome: Progressing Encouraged Oral Intake. Measuring Intake and Output.  Problem: Physical Regulation: Goal: Signs and symptoms of infection will decrease Outcome: Progressing Vital Signs stable.  Remains on IV Antibiotics.  Problem: Respiratory: Goal: Ability to maintain adequate ventilation will improve Outcome: Progressing Vital Signs stable.  Remains on IV Antibiotics. Remains on 4L O2 via Nasal Cannula. O2 sat 95%. Baseline.      Nocturnal Bi-PAP.        Encouraged cough and deep breathing with teach back. Successfully demonstrating.

## 2014-12-12 NOTE — Discharge Summary (Addendum)
[facility] [place] [facility]   PATIENT NAME: Mindy Cook    MR#:  [id number]  DATE OF BIRTH:  20-Aug-1933  DATE OF ADMISSION:  12/06/2014 ADMITTING PHYSICIAN: Alford Highland, MD  DATE OF DISCHARGE: 12/13/2014  PRIMARY CARE PHYSICIAN: Patrice Paradise, MD    ADMISSION DIAGNOSIS:  Respiratory distress [R06.00] UTI (lower urinary tract infection) [N39.0] Sepsis, due to unspecified organism (HCC) [A41.9]    DISCHARGE DIAGNOSIS:  Active Problems:   Sepsis (HCC)   Hypoxia Stage 2 sacral pressure ulcer  SECONDARY DIAGNOSIS:   Past Medical History  Diagnosis Date  . Asthma   . A-fib (HCC)   . Arthritis   . Aortic stenosis   . Aortic insufficiency   . Hyperlipidemia   . Hypertension   . Sleep apnea   . CHF (congestive heart failure) (HCC)   . History of DVT (deep vein thrombosis)   . Diabetes mellitus type 2, uncontrolled (HCC)   . COPD (chronic obstructive pulmonary disease) (HCC)   . Cervical spondylolysis   . DDD (degenerative disc disease), cervical   . Anemia   . Mitral valve disorder   . Anxiety   . Chronic gout   . Restless leg syndrome   . Osteoarthritis     HOSPITAL COURSE:   81 year old female who presented with acute hypoxic respiratory failure.  1. Acute hypoxic respiratory failure on chronic hypercapnic respiratory failure: Patient is currently on 3 L of oxygen her baseline is 2-3 L. Acute hypoxic respiratory failure was thought to be secondary to aspiration pneumonia. Continue doxycycline for aspiration pneumonia as per pulmonary consultation. She will require 5 more days and needs aspiration precautions. She was on CPAP at night but does not need CPAP at discharge as per my conversation with PULMONARY   2. Metabolic encephalopathy: This is secondary sepsis which is resolved.  3. Sepsis: This is secondary to ESBL urinary tract infection as well as aspiration pneumonia. As per her sensitivities patient was changed  from ertapenem to ciprofloxacin. She needs treatment for 5 more days Continue doxycycline doe aspiration Pneumonia.  Macrobid is on hold as she is already on antibiotics but can be restarted after completion of these 2 antibiotics. She takes macrobid 50 mg daily for prevention of chronic UTIs.  4. Hypokalemia: Potassium has been repleted.  5. Type 2 diabetes: I have discontinued Januvia for now but may be restarted once she is taking in adequate diet or if her blood sugars are running high. She will continue Amaryl. Monit blood sugars QAC and QHS  6. Chronic diastolic heart failure: Continue metolazone and Lasix.No signs of exacerbation.  7. History of COPD: No signs of exacerbation at this time.  8. Bradycardia: Resolved  9. Stage 2 sacral pressure ulceration: Needs q 2 hour position change and appropriate dressing to prevent worsening.  10. Goals of care: Patient seen by inpatient palliative care team and will benefit from Palliative Care at [facility].   DISCHARGE CONDITIONS AND DIET:   SNF Stable Diabetic diet  CONSULTS OBTAINED:     DRUG ALLERGIES:   Allergies  Allergen Reactions  . Hydrocodone-Acetaminophen Nausea Only    DISCHARGE MEDICATIONS:   Current Discharge Medication List    START taking these medications   Details  ciprofloxacin (CIPRO) 500 MG tablet Take 1 tablet (500 mg total) by mouth 2 (two) times daily. Qty: 10 tablet, Refills: 0    doxycycline (VIBRAMYCIN) 50 MG capsule Take 2 capsules (100 mg total) by mouth 2 (two)  times daily. Qty: 20 capsule, Refills: 0      CONTINUE these medications which have NOT CHANGED   Details  acetaminophen (TYLENOL) 325 MG tablet Take 650 mg by mouth See admin instructions. Take 2 tablets ( ) orally four times a day. (scheduled) Take 2 tablets ( ) orally every 4 hours as needed for pain and/or increased temperature. (prn)    acidophilus (RISAQUAD) CAPS capsule Take 1 capsule by mouth 2 (two) times daily.     allopurinol (ZYLOPRIM) 100 MG tablet Take 100 mg by mouth daily.    ALPRAZolam (XANAX) 0.25 MG tablet Take 1 tablet (0.25 mg total) by mouth every evening. Qty: 30 tablet, Refills: 0    azelastine (ASTELIN) 0.1 % nasal spray Place 1 spray into both nostrils 2 (two) times daily.    bisacodyl (DULCOLAX) 10 MG suppository Place 10 mg rectally daily as needed for mild constipation, moderate constipation or severe constipation.    carvedilol (COREG) 3.125 MG tablet Take 3.125 mg by mouth 2 (two) times daily.    clotrimazole (LOTRIMIN) 1 % cream Apply 1 application topically 2 (two) times daily. Pt mixes with zinc oxide cream.    Cranberry 250 MG TABS Take 500 mg by mouth daily.    cyclobenzaprine (FLEXERIL) 5 MG tablet Take 5 mg by mouth 3 (three) times daily as needed for muscle spasms.    digoxin (LANOXIN) 0.125 MG tablet Take 0.0625 mg by mouth daily.    ferrous sulfate 325 (65 FE) MG tablet Take 325 mg by mouth daily.    Fluticasone-Salmeterol (ADVAIR) 250-50 MCG/DOSE AEPB Inhale 1 puff into the lungs every 12 (twelve) hours.     furosemide (LASIX) 20 MG tablet Take 20 mg by mouth daily.    gabapentin (NEURONTIN) 300 MG capsule Take 1 capsule (300 mg total) by mouth at bedtime. Qty: 30 capsule, Refills: 0    glimepiride (AMARYL) 1 MG tablet Take 1 mg by mouth 2 (two) times daily.    insulin regular (NOVOLIN R,HUMULIN R) 100 units/mL injection Inject into the skin See admin instructions. Inject per sliding scale subcutaneous before meals and at bedtime as needed. SS: If bs is 81 to 175=give 0 units, 176 to 250=give 3 units, 251 to 325=give 6 units, 326 to 450=10 units, greater than 450=give 12 units, greater than or equal to 451=give 12 units and recheck in 1 hour, if greater than and or equal to 451 on 2 consecutive occasions, Call MD.    ipratropium-albuterol (DUONEB) 0.5-2.5 (3) MG/3ML SOLN Take 3 mLs by nebulization every 4 (four) hours as needed. Qty: 360 mL, Refills: 1     magnesium hydroxide (MILK OF MAGNESIA) 400 MG/5ML suspension Take 30 mLs by mouth daily as needed for mild constipation or moderate constipation.    meclizine (ANTIVERT) 12.5 MG tablet Take 12.5 mg by mouth 3 (three) times daily as needed for dizziness.    metolazone (ZAROXOLYN) 5 MG tablet Take 5 mg by mouth 3 (three) times a week. Pt takes on Monday, Thursday, and Sunday.    mirabegron ER (MYRBETRIQ) 50 MG TB24 tablet Take 1 tablet (50 mg total) by mouth daily. Qty: 30 tablet, Refills: 2    mometasone (NASONEX) 50 MCG/ACT nasal spray Place 1 spray into the nose daily.     montelukast (SINGULAIR) 10 MG tablet Take 10 mg by mouth at bedtime.     ondansetron (ZOFRAN) 4 MG tablet Take 4 mg by mouth every 6 (six) hours as needed for nausea or vomiting.  polyethylene glycol (MIRALAX / GLYCOLAX) packet Take 17 g by mouth daily. Qty: 14 each, Refills: 0    potassium chloride SA (K-DUR,KLOR-CON) 20 MEQ tablet Take 20 mEq by mouth 2 (two) times daily.    pravastatin (PRAVACHOL) 40 MG tablet Take 40 mg by mouth at bedtime.    promethazine (PHENERGAN) 25 MG/ML injection Inject 25 mg into the vein every 4 (four) hours as needed for nausea or vomiting.    rOPINIRole (REQUIP) 0.25 MG tablet Take 0.25 mg by mouth at bedtime.    sennosides-docusate sodium (SENOKOT-S) 8.6-50 MG tablet Take 1 tablet by mouth 2 (two) times daily.    traMADol (ULTRAM) 50 MG tablet Take 1 tablet (50 mg total) by mouth every 6 (six) hours as needed for moderate pain. Qty: 30 tablet, Refills: 0    traZODone (DESYREL) 50 MG tablet Take 50 mg by mouth at bedtime as needed for sleep (for insomnia).    Trolamine Salicylate (ASPERCREME) 10 % LOTN Apply 1 application topically 4 (four) times daily. Apply a thin film to both ankles/top of feet and any other painful areas.    ZINC OXIDE, TOPICAL, 10 % CREA Apply 1 application topically 2 (two) times daily. Pt mixes with Clotrimazole cream.      STOP taking these  medications     nitrofurantoin (MACRODANTIN) 50 MG capsule      sitaGLIPtin (JANUVIA) 100 MG tablet      docusate sodium (COLACE) 100 MG capsule      senna (SENOKOT) 8.6 MG TABS tablet         Today   CHIEF COMPLAINT:  Doing well this am no issues overnight   VITAL SIGNS:  Blood pressure 131/51, pulse 63, temperature 98.1 F (36.7 C), temperature source Oral, resp. rate 20, height  (1.6 m), weight 81.8 kg (180 lb 5.4 oz), SpO2 95 %.   REVIEW OF SYSTEMS:  Review of Systems  Constitutional: Negative for fever, chills and malaise/fatigue.  HENT: Negative for sore throat.   Eyes: Negative for blurred vision.  Respiratory: Negative for cough, hemoptysis, shortness of breath and wheezing.   Cardiovascular: Negative for chest pain, palpitations and leg swelling.  Gastrointestinal: Negative for nausea, vomiting, abdominal pain, diarrhea and blood in stool.  Genitourinary: Negative for dysuria.  Musculoskeletal: Negative for back pain.  Neurological: Negative for dizziness, tremors and headaches.  Endo/Heme/Allergies: Does not bruise/bleed easily.     PHYSICAL EXAMINATION:  GENERAL:  81 y.o.-year-old patient lying in the bed with no acute distress.  NECK:  Supple, no jugular venous distention. No thyroid enlargement, no tenderness.  LUNGS: Normal breath sounds bilaterally, no wheezing, rales,rhonchi  No use of accessory muscles of respiration.  CARDIOVASCULAR: S1, S2 normal. 3/6 SEM murmurs, no rubs, or gallops.  ABDOMEN: Soft, non-tender, non-distended. Bowel sounds present. No organomegaly or mass.  EXTREMITIES: No pedal edema, cyanosis, or clubbing.  PSYCHIATRIC: The patient is alert and oriented x 3.  SKIN: stage 2 pressure ulcer over sacral area Able to move extremeties DATA REVIEW:   CBC  Recent Labs Lab 12/11/14 0436  WBC 8.0  HGB 11.0*  HCT 34.4*  PLT 144*    Chemistries   Recent Labs Lab 12/08/14 1307  12/12/14 0500 12/13/14 0527  NA  --   <  > 142  --   K  --   < > 4.0  --   CL  --   < > 97*  --   CO2  --   < > 42*  --  GLUCOSE  --   < > 138*  --   BUN  --   < > 25*  --   CREATININE  --   < > 0.73 0.70  CALCIUM  --   < > 9.8  --   MG 1.9  --   --   --   < > = values in this interval not displayed.  Cardiac Enzymes No results for input(s): TROPONINI in the last 168 hours.  Microbiology Results  @MICRORSLT48 @  RADIOLOGY:  No results found.    Management plans discussed with the patient and she is in agreement. Stable for discharge SNF  Patient should follow up with PCP in 1 week  CODE STATUS:     Code Status Orders        Start     Ordered   12/09/14 1751  Do not attempt resuscitation (DNR)   Continuous    Question Answer Comment  In the event of cardiac or respiratory ARREST Do not call a "code blue"   In the event of cardiac or respiratory ARREST Do not perform Intubation, CPR, defibrillation or ACLS   In the event of cardiac or respiratory ARREST Use medication by any route, position, wound care, and other measures to relive pain and suffering. May use oxygen, suction and manual treatment of airway obstruction as needed for comfort.      12/09/14 1751    Advance Directive Documentation        Most Recent Value   Type of Advance Directive  Living will   Pre-existing out of facility DNR order (yellow form or pink MOST form)     "MOST" Form in Place?        TOTAL TIME TAKING CARE OF THIS PATIENT: 35 minutes.    Note: This dictation was prepared with Dragon dictation along with smaller phrase technology. Any transcriptional errors that result from this process are unintentional.  Elby ShowersWALSH, Antwione Picotte M.D on 12/13/2014 at 9:07 AM  Between 7am to 6pm - Pager - [phone] After 6pm go to [email or web address] - password [email or web address] [facility]  [facility]  Office  [phone]  CC: Primary care physician; Patrice ParadiseMCLAUGHLIN, [name], MD

## 2014-12-13 LAB — CREATININE, SERUM
Creatinine, Ser: 0.7 mg/dL (ref 0.44–1.00)
GFR calc Af Amer: >60 mL/min (ref >60)
GFR calc non Af Amer: >60 mL/min (ref >60)

## 2014-12-13 LAB — GLUCOSE, CAPILLARY
GLUCOSE-CAPILLARY: 155 mg/dL — AB (ref 65–99)
Glucose-Capillary: 212 mg/dL — ABNORMAL HIGH (ref 65–99)
Glucose-Capillary: 151 mg/dL — ABNORMAL HIGH (ref 65–99)

## 2014-12-13 NOTE — Progress Notes (Signed)
Patient was to be discharged yesterday, but bed not available. Seen this am.  Discharge summary updated. Only new issue is pressure ulcer over sacral area. Will need increased attention to position changes to prevent worsening.  Will need wound care for pressure ulcer.  Also note that she will need continued palliative care once discharged to SNF.  Vitals and examination updated in DC summary. No other changes made to plan of care.

## 2014-12-13 NOTE — NC FL2 (Signed)
[place] MEDICAID FL2 LEVEL OF CARE SCREENING TOOL     IDENTIFICATION  Patient Name: Mindy GuysShelby J Decatur Birthdate: [birth year] Sex: female Admission Date (Current Location): 12/06/2014  [place]   Facility and Address:  [facility], [address], [place], [place] [address]      Provider Number: [id number]  Attending Physician Name and Address:  Gale Journeyatherine P Walsh, MD  Relative Name and Phone Number:       Current Level of Care: Hospital Recommended Level of Care: Skilled Nursing Facility Prior Approval Number:    Date Approved/Denied:   PASRR Number: [id number]  Discharge Plan: SNF    Current Diagnoses: Patient Active Problem List   Diagnosis Date Noted  . Hypoxia   . Sepsis (HCC) 12/06/2014  . Malnutrition of moderate degree (HCC) 09/18/2014  . Hypoxemia 09/16/2014  . Pressure ulcer 09/16/2014  . Chronic prescription benzodiazepine use   . Acute on chronic respiratory failure with hypoxia (HCC) 08/31/2014  . Acute bronchitis 08/31/2014  . UTI (urinary tract infection) 08/31/2014  . Gout attack 08/31/2014  . Generalized weakness 08/31/2014  . Pain in joint, lower leg 08/31/2014  . Acute on chronic respiratory failure (HCC) 08/28/2014  . COPD exacerbation (HCC) 08/28/2014  . Fall 08/28/2014  . A-fib (HCC) 07/21/2014  . CAFL (chronic airflow limitation) (HCC) 07/21/2014  . H/O deep venous thrombosis 07/21/2014  . HLD (hyperlipidemia) 07/21/2014  . Apnea, sleep 07/21/2014  . Complete rotator cuff rupture of left shoulder 07/06/2014  . Cervico-occipital neuralgia 07/15/2013  . Cervical osteoarthritis 06/28/2013  . DDD (degenerative disc disease), cervical 06/28/2013  . Muscle spasms of neck 06/28/2013  . Renal colic 06/12/2013  . Neuropathy (HCC) 05/23/2013  . History of open heart surgery 05/07/2013  . Arthritis 04/23/2013  . Acquired cyst of kidney 11/20/2012  . Hypercapnemia 06/12/2012  . H/O  aortic valve replacement 06/12/2012  . Congestive heart failure (HCC) 04/26/2012  . Heart failure (HCC) 04/26/2012  . Absolute anemia 02/08/2012  . Aortic valve defect 02/08/2012  . Benign essential HTN 02/08/2012  . Bladder infection, chronic 02/08/2012  . Calculus of gallbladder 02/08/2012  . Calculus of kidney 02/08/2012  . Biliary calculi 02/08/2012  . Dermatophytosis of scalp and beard 02/08/2012  . Incomplete bladder emptying 02/08/2012  . Disorder of mitral valve 02/08/2012  . Mixed incontinence 02/08/2012  . Diabetes mellitus, type 2 (HCC) 02/08/2012    Orientation ACTIVITIES/SOCIAL BLADDER RESPIRATION    Self  Passive Incontinent O2 (As needed), Other (Comment) (Bipap)  BEHAVIORAL SYMPTOMS/MOOD NEUROLOGICAL BOWEL NUTRITION STATUS      Incontinent Diet (Dys 1, Honey Thick)  PHYSICIAN VISITS COMMUNICATION OF NEEDS Height & Weight Skin  30 days Verbally 5\' 3"  (160 cm) 180 lbs. Normal          AMBULATORY STATUS RESPIRATION    Assist extensive O2 (As needed), Other (Comment) (Bipap)      Personal Care Assistance Level of Assistance  Bathing, Feeding, Dressing Bathing Assistance: Maximum assistance Feeding assistance: Maximum assistance Dressing Assistance: Maximum assistance      Functional Limitations Info  Sight, Hearing Sight Info: Impaired Hearing Info: Impaired         SPECIAL CARE FACTORS FREQUENCY                      Additional Factors Info  Code Status, Allergies, Isolation Precautions Code Status Info: Full Code Allergies Info: Allergies: Hydrocodone-acetaminophen     Isolation Precautions Info: MRSA  Current Medications (12/13/2014):  This is the current hospital active medication list Current Facility-Administered Medications  Medication Dose Route Frequency Provider Last Rate Last Dose  . allopurinol (ZYLOPRIM) tablet 100 mg  100 mg Oral Daily Adrian Saran, MD   100 mg at 12/13/14 0914  . antiseptic oral rinse (CPC /  CETYLPYRIDINIUM CHLORIDE 0.05%) solution 7 mL  7 mL Mouth Rinse q12n4p Katha Hamming, MD   7 mL at 12/12/14 1600  . carvedilol (COREG) tablet 3.125 mg  3.125 mg Oral BID Adrian Saran, MD   3.125 mg at 12/13/14 0914  . chlorhexidine (PERIDEX) 0.12 % solution 15 mL  15 mL Mouth Rinse BID Katha Hamming, MD   15 mL at 12/13/14 0914  . ciprofloxacin (CIPRO) IVPB 400 mg  400 mg Intravenous Q12H Cindi Carbon, RPH   400 mg at 12/13/14 0915  . digoxin (LANOXIN) tablet 0.0625 mg  0.0625 mg Oral Daily Adrian Saran, MD   0.125 mg at 12/13/14 0914  . docusate sodium (COLACE) capsule 100 mg  100 mg Oral BID Katha Hamming, MD   100 mg at 12/13/14 0914  . doxycycline (VIBRAMYCIN) 100 mg in dextrose 5 % 250 mL IVPB  100 mg Intravenous Q12H Karl Ito, MD   100 mg at 12/12/14 2311  . enoxaparin (LOVENOX) injection 40 mg  40 mg Subcutaneous Q24H Katha Hamming, MD   40 mg at 12/13/14 0915  . ferrous sulfate tablet 325 mg  325 mg Oral Daily Adrian Saran, MD   325 mg at 12/13/14 0914  . furosemide (LASIX) tablet 20 mg  20 mg Oral Daily Adrian Saran, MD   20 mg at 12/13/14 0914  . gabapentin (NEURONTIN) capsule 300 mg  300 mg Oral QHS Sital Mody, MD   300 mg at 12/12/14 2157  . hydrALAZINE (APRESOLINE) injection 10 mg  10 mg Intravenous Q6H PRN Altamese Dilling, MD   10 mg at 12/08/14 0535  . insulin aspart (novoLOG) injection 0-9 Units  0-9 Units Subcutaneous TID WC Katha Hamming, MD   2 Units at 12/13/14 0800  . ipratropium-albuterol (DUONEB) 0.5-2.5 (3) MG/3ML nebulizer solution 3 mL  3 mL Nebulization Q6H PRN Katha Hamming, MD      . metolazone (ZAROXOLYN) tablet 5 mg  5 mg Oral Once per day on Mon Wed Fri Katha Hamming, MD   5 mg at 12/12/14 1204  . pneumococcal 13-valent conjugate vaccine (PREVNAR 13) injection 0.5 mL  0.5 mL Intramuscular Prior to discharge Katha Hamming, MD      . potassium chloride SA (K-DUR,KLOR-CON) CR tablet 20 mEq  20 mEq Oral BID Adrian Saran,  MD   20 mEq at 12/13/14 0914  . pravastatin (PRAVACHOL) tablet 40 mg  40 mg Oral QHS Adrian Saran, MD   40 mg at 12/12/14 2157  . rOPINIRole (REQUIP) tablet 0.25 mg  0.25 mg Oral QHS Adrian Saran, MD   0.25 mg at 12/12/14 2156  . senna (SENOKOT) tablet 8.6 mg  1 tablet Oral QHS PRN Katha Hamming, MD      . sodium chloride 0.9 % injection 3 mL  3 mL Intravenous Q12H Katha Hamming, MD   3 mL at 12/13/14 1610     Discharge Medications: Please see discharge summary for a list of discharge medications.  Relevant Imaging Results:  Relevant Lab Results:  Recent Labs    Additional Information    Soundra Pilon, LCSW

## 2014-12-13 NOTE — Plan of Care (Signed)
Problem: Respiratory: Goal: Ability to maintain adequate ventilation will improve Outcome: Progressing Pt did not require BiPAP during shift. Pt was weaned to 2L by RT during shift. Pt has been resting comfortably during shift. Pt denies pain. No other signs of distress noted. Will continue to monitor.

## 2014-12-13 NOTE — Progress Notes (Signed)
Clinical Social Worker informed by Elby Showersatherine Walsh MD that patient is medically ready to [facility], Patient and  Daughter Selena BattenKim ( call to patient's daughter) are in a agreement with plan.  Call to SNF to confirm that patient's bed is ready. Provided patient's room number 222 and number to call for report [phone] . All discharge information faxed to  Facility.   RN will call report and [facility].  Sammuel Hineseborah Dolphus Linch. Theresia MajorsLCSWA, MSW Clinical Social Work Department [phone] 10:40 AM

## 2014-12-13 NOTE — [place] FL2 (Signed)
[place] MEDICAID FL2 LEVEL OF CARE SCREENING TOOL     IDENTIFICATION  Patient Name: Mindy Cook Birthdate: Nov 25, 1933 Sex: female Admission Date (Current Location): 12/06/2014  [place] and [place] Number: [facility] and Address:  [facility], [address] [place], [place] [address]      Provider Number: [id number]  Attending Physician Name and Address:  Gale Journey, MD  Relative Name and Phone Number:       Current Level of Care: Hospital Recommended Level of Care: Skilled Nursing Facility Prior Approval Number:    Date Approved/Denied:   PASRR Number: [id number]  Discharge Plan: SNF    Current Diagnoses: Patient Active Problem List   Diagnosis Date Noted  . Hypoxia   . Sepsis (HCC) 12/06/2014  . Malnutrition of moderate degree (HCC) 09/18/2014  . Hypoxemia 09/16/2014  . Pressure ulcer 09/16/2014  . Chronic prescription benzodiazepine use   . Acute on chronic respiratory failure with hypoxia (HCC) 08/31/2014  . Acute bronchitis 08/31/2014  . UTI (urinary tract infection) 08/31/2014  . Gout attack 08/31/2014  . Generalized weakness 08/31/2014  . Pain in joint, lower leg 08/31/2014  . Acute on chronic respiratory failure (HCC) 08/28/2014  . COPD exacerbation (HCC) 08/28/2014  . Fall 08/28/2014  . A-fib (HCC) 07/21/2014  . CAFL (chronic airflow limitation) (HCC) 07/21/2014  . H/O deep venous thrombosis 07/21/2014  . HLD (hyperlipidemia) 07/21/2014  . Apnea, sleep 07/21/2014  . Complete rotator cuff rupture of left shoulder 07/06/2014  . Cervico-occipital neuralgia 07/15/2013  . Cervical osteoarthritis 06/28/2013  . DDD (degenerative disc disease), cervical 06/28/2013  . Muscle spasms of neck 06/28/2013  . Renal colic 06/12/2013  . Neuropathy (HCC) 05/23/2013  . History of open heart surgery 05/07/2013  . Arthritis 04/23/2013  . Acquired cyst of kidney 11/20/2012  . Hypercapnemia 06/12/2012  . H/O  aortic valve replacement 06/12/2012  . Congestive heart failure (HCC) 04/26/2012  . Heart failure (HCC) 04/26/2012  . Absolute anemia 02/08/2012  . Aortic valve defect 02/08/2012  . Benign essential HTN 02/08/2012  . Bladder infection, chronic 02/08/2012  . Calculus of gallbladder 02/08/2012  . Calculus of kidney 02/08/2012  . Biliary calculi 02/08/2012  . Dermatophytosis of scalp and beard 02/08/2012  . Incomplete bladder emptying 02/08/2012  . Disorder of mitral valve 02/08/2012  . Mixed incontinence 02/08/2012  . Diabetes mellitus, type 2 (HCC) 02/08/2012    Orientation ACTIVITIES/SOCIAL BLADDER RESPIRATION    Self  Passive Incontinent O2 (As needed), Other (Comment) (Bipap)  BEHAVIORAL SYMPTOMS/MOOD NEUROLOGICAL BOWEL NUTRITION STATUS      Incontinent Diabetic diet  PHYSICIAN VISITS COMMUNICATION OF NEEDS Height & Weight Skin Stage 2 sacral ulcer  30 days Verbally  (160 cm) 180 lbs. Normal          AMBULATORY STATUS RESPIRATION    Assist extensive O2 (As needed), Other (Comment) (Bipap)      Personal Care Assistance Level of Assistance  Bathing, Feeding, Dressing Bathing Assistance: Maximum assistance Feeding assistance: Maximum assistance Dressing Assistance: Maximum assistance      Functional Limitations Info  Sight, Hearing Sight Info: Impaired Hearing Info: Impaired         SPECIAL CARE FACTORS FREQUENCY                      Additional Factors Info  Code Status, Allergies, Isolation Precautions Code Status Info: Full Code Allergies Info: Allergies: Hydrocodone-acetaminophen     Isolation Precautions Info: MRSA  Current Medications (12/13/2014):  This is the current hospital active medication list Current Facility-Administered Medications  Medication Dose Route Frequency Provider Last Rate Last Dose  . allopurinol (ZYLOPRIM) tablet 100 mg  100 mg Oral Daily Adrian SaranSital Mody, MD   100 mg at 12/13/14 0914  . antiseptic oral rinse (CPC /  CETYLPYRIDINIUM CHLORIDE 0.05%) solution 7 mL  7 mL Mouth Rinse q12n4p Katha HammingSnehalatha [name], MD   7 mL at 12/12/14 1600  . carvedilol (COREG) tablet 3.125 mg  3.125 mg Oral BID Adrian SaranSital Mody, MD   3.125 mg at 12/13/14 0914  . chlorhexidine (PERIDEX) 0.12 % solution 15 mL  15 mL Mouth Rinse BID Katha HammingSnehalatha [name], MD   15 mL at 12/13/14 0914  . ciprofloxacin (CIPRO) IVPB 400 mg  400 mg Intravenous Q12H Cindi CarbonMary M Swayne, RPH   400 mg at 12/13/14 0915  . digoxin (LANOXIN) tablet 0.0625 mg  0.0625 mg Oral Daily Adrian SaranSital Mody, MD   0.125 mg at 12/13/14 0914  . docusate sodium (COLACE) capsule 100 mg  100 mg Oral BID Katha HammingSnehalatha [name], MD   100 mg at 12/13/14 0914  . doxycycline (VIBRAMYCIN) 100 mg in dextrose 5 % 250 mL IVPB  100 mg Intravenous Q12H Karl ItoSteven E Sommer, MD   100 mg at 12/12/14 2311  . enoxaparin (LOVENOX) injection 40 mg  40 mg Subcutaneous Q24H Katha HammingSnehalatha [name], MD   40 mg at 12/13/14 0915  . ferrous sulfate tablet 325 mg  325 mg Oral Daily Adrian SaranSital Mody, MD   325 mg at 12/13/14 0914  . furosemide (LASIX) tablet 20 mg  20 mg Oral Daily Adrian SaranSital Mody, MD   20 mg at 12/13/14 0914  . gabapentin (NEURONTIN) capsule 300 mg  300 mg Oral QHS Sital Mody, MD   300 mg at 12/12/14 2157  . hydrALAZINE (APRESOLINE) injection 10 mg  10 mg Intravenous Q6H PRN Altamese DillingVaibhavkumar Vachhani, MD   10 mg at 12/08/14 0535  . insulin aspart (novoLOG) injection 0-9 Units  0-9 Units Subcutaneous TID WC Katha HammingSnehalatha [name], MD   2 Units at 12/13/14 0800  . ipratropium-albuterol (DUONEB) 0.5-2.5 (3) MG/3ML nebulizer solution 3 mL  3 mL Nebulization Q6H PRN Katha HammingSnehalatha [name], MD      . metolazone (ZAROXOLYN) tablet 5 mg  5 mg Oral Once per day on Mon Wed Fri Katha HammingSnehalatha [name], MD   5 mg at 12/12/14 1204  . pneumococcal 13-valent conjugate vaccine (PREVNAR 13) injection 0.5 mL  0.5 mL Intramuscular Prior to discharge Katha HammingSnehalatha [name], MD      . potassium chloride SA (K-DUR,KLOR-CON) CR tablet 20 mEq  20 mEq Oral BID Adrian SaranSital Mody,  MD   20 mEq at 12/13/14 0914  . pravastatin (PRAVACHOL) tablet 40 mg  40 mg Oral QHS Adrian SaranSital Mody, MD   40 mg at 12/12/14 2157  . rOPINIRole (REQUIP) tablet 0.25 mg  0.25 mg Oral QHS Adrian SaranSital Mody, MD   0.25 mg at 12/12/14 2156  . senna (SENOKOT) tablet 8.6 mg  1 tablet Oral QHS PRN Katha HammingSnehalatha [name], MD      . sodium chloride 0.9 % injection 3 mL  3 mL Intravenous Q12H Katha HammingSnehalatha [name], MD   3 mL at 12/13/14 [id number]     Discharge Medications: Please see discharge summary for a list of discharge medications. Current Discharge Medication List    START taking these medications   Details  ciprofloxacin (CIPRO) 500 MG tablet Take 1 tablet (500 mg total) by mouth 2 (two) times daily. Qty: 10 tablet, Refills: 0  doxycycline (VIBRAMYCIN) 50 MG capsule Take 2 capsules (100 mg total) by mouth 2 (two) times daily. Qty: 20 capsule, Refills: 0      CONTINUE these medications which have NOT CHANGED   Details  acetaminophen (TYLENOL) 325 MG tablet Take 650 mg by mouth See admin instructions. Take 2 tablets (650mg ) orally four times a day. (scheduled) Take 2 tablets (650mg ) orally every 4 hours as needed for pain and/or increased temperature. (prn)    acidophilus (RISAQUAD) CAPS capsule Take 1 capsule by mouth 2 (two) times daily.    allopurinol (ZYLOPRIM) 100 MG tablet Take 100 mg by mouth daily.    ALPRAZolam (XANAX) 0.25 MG tablet Take 1 tablet (0.25 mg total) by mouth every evening. Qty: 30 tablet, Refills: 0    azelastine (ASTELIN) 0.1 % nasal spray Place 1 spray into both nostrils 2 (two) times daily.    bisacodyl (DULCOLAX) 10 MG suppository Place 10 mg rectally daily as needed for mild constipation, moderate constipation or severe constipation.    carvedilol (COREG) 3.125 MG tablet Take 3.125 mg by mouth 2 (two) times daily.    clotrimazole (LOTRIMIN) 1 % cream Apply 1 application topically 2 (two) times daily. Pt mixes with zinc oxide cream.     Cranberry 250 MG TABS Take 500 mg by mouth daily.    cyclobenzaprine (FLEXERIL) 5 MG tablet Take 5 mg by mouth 3 (three) times daily as needed for muscle spasms.    digoxin (LANOXIN) 0.125 MG tablet Take 0.0625 mg by mouth daily.    ferrous sulfate 325 (65 FE) MG tablet Take 325 mg by mouth daily.    Fluticasone-Salmeterol (ADVAIR) 250-50 MCG/DOSE AEPB Inhale 1 puff into the lungs every 12 (twelve) hours.     furosemide (LASIX) 20 MG tablet Take 20 mg by mouth daily.    gabapentin (NEURONTIN) 300 MG capsule Take 1 capsule (300 mg total) by mouth at bedtime. Qty: 30 capsule, Refills: 0    glimepiride (AMARYL) 1 MG tablet Take 1 mg by mouth 2 (two) times daily.    insulin regular (NOVOLIN R,HUMULIN R) 100 units/mL injection Inject into the skin See admin instructions. Inject per sliding scale subcutaneous before meals and at bedtime as needed. SS: If bs is 81 to 175=give 0 units, 176 to 250=give 3 units, 251 to 325=give 6 units, 326 to 450=10 units, greater than 450=give 12 units, greater than or equal to 451=give 12 units and recheck in 1 hour, if greater than and or equal to 451 on 2 consecutive occasions, Call MD.    ipratropium-albuterol (DUONEB) 0.5-2.5 (3) MG/3ML SOLN Take 3 mLs by nebulization every 4 (four) hours as needed. Qty: 360 mL, Refills: 1    magnesium hydroxide (MILK OF MAGNESIA) 400 MG/5ML suspension Take 30 mLs by mouth daily as needed for mild constipation or moderate constipation.    meclizine (ANTIVERT) 12.5 MG tablet Take 12.5 mg by mouth 3 (three) times daily as needed for dizziness.    metolazone (ZAROXOLYN) 5 MG tablet Take 5 mg by mouth 3 (three) times a week. Pt takes on Monday, Thursday, and Sunday.    mirabegron ER (MYRBETRIQ) 50 MG TB24 tablet Take 1 tablet (50 mg total) by mouth daily. Qty: 30 tablet, Refills: 2    mometasone (NASONEX) 50 MCG/ACT nasal spray Place 1 spray into the nose daily.     montelukast  (SINGULAIR) 10 MG tablet Take 10 mg by mouth at bedtime.     ondansetron (ZOFRAN) 4 MG tablet Take 4 mg  by mouth every 6 (six) hours as needed for nausea or vomiting.    polyethylene glycol (MIRALAX / GLYCOLAX) packet Take 17 g by mouth daily. Qty: 14 each, Refills: 0    potassium chloride SA (K-DUR,KLOR-CON) 20 MEQ tablet Take 20 mEq by mouth 2 (two) times daily.    pravastatin (PRAVACHOL) 40 MG tablet Take 40 mg by mouth at bedtime.    promethazine (PHENERGAN) 25 MG/ML injection Inject 25 mg into the vein every 4 (four) hours as needed for nausea or vomiting.    rOPINIRole (REQUIP) 0.25 MG tablet Take 0.25 mg by mouth at bedtime.    sennosides-docusate sodium (SENOKOT-S) 8.6-50 MG tablet Take 1 tablet by mouth 2 (two) times daily.    traMADol (ULTRAM) 50 MG tablet Take 1 tablet (50 mg total) by mouth every 6 (six) hours as needed for moderate pain. Qty: 30 tablet, Refills: 0    traZODone (DESYREL) 50 MG tablet Take 50 mg by mouth at bedtime as needed for sleep (for insomnia).    Trolamine Salicylate (ASPERCREME) 10 % LOTN Apply 1 application topically 4 (four) times daily. Apply a thin film to both ankles/top of feet and any other painful areas.    ZINC OXIDE, TOPICAL, 10 % CREA Apply 1 application topically 2 (two) times daily. Pt mixes with Clotrimazole cream.      STOP taking these medications     nitrofurantoin (MACRODANTIN) 50 MG capsule      sitaGLIPtin (JANUVIA) 100 MG tablet      docusate sodium (COLACE) 100 MG capsule      senna (SENOKOT) 8.6 MG TABS tablet             Relevant Imaging Results:  Relevant Lab Results:  Recent Labs    Additional Information    Soundra Pilon, LCSW

## 2014-12-13 NOTE — Progress Notes (Signed)
MD making rounds. Discharge orders received. Clinical Child psychotherapistocial Worker (CSW) facilitating discharge to [facility]. Discharge packet being prepared. Telemetry Removed. IV removed. Vital Signs Stable. No signs and/or symptoms of distress noted. Denies pain. Denies needs. Report called to Selena BattenKim, [facility]. No unanswered questions. EMS called for transport. Awaiting EMS. EMS on Unit for transport. Discharge Packet given to EMS transporter. Belongings sent with EMS.

## 2014-12-13 NOTE — Progress Notes (Signed)
ANTIBIOTIC CONSULT NOTE - INITIAL  Pharmacy Consult for ciprofloxacin Indication: ESBL-positive E. Coli UTI  Allergies  Allergen Reactions  . Hydrocodone-Acetaminophen Nausea Only    Patient Measurements: Height:  (160 cm) Weight: 180 lb 5.4 oz (81.8 kg) IBW/kg (Calculated) : 52.4  Vital Signs: Temp: 98.1 F (36.7 C) (12/03 0541) Temp Source: Oral (12/03 0541) BP: 131/51 mmHg (12/03 0541) Pulse Rate: 63 (12/03 0541) Intake/Output from previous day: 12/02 0701 - 12/03 0700 In: 450 [IV Piggyback:450] Out: -  Intake/Output from this shift:    Labs:  Recent Labs  12/11/14 0436 12/12/14 0500 12/13/14 0527  WBC 8.0  --   --   HGB 11.0*  --   --   PLT 144*  --   --   CREATININE 0.72 0.73 0.70   Estimated Creatinine Clearance: 57.8 mL/min (by C-G formula based on Cr of 0.7). No results for input(s): VANCOTROUGH, VANCOPEAK, VANCORANDOM, GENTTROUGH, GENTPEAK, GENTRANDOM, TOBRATROUGH, TOBRAPEAK, TOBRARND, AMIKACINPEAK, AMIKACINTROU, AMIKACIN in the last 72 hours.   Estimated Creatinine Clearance: 57.8 mL/min (by C-G formula based on Cr of 0.7).   Microbiology: Recent Results (from the past 720 hour(s))  Culture, blood (routine x 2)     Status: None   Collection Time: 12/06/14  7:02 AM  Result Value Ref Range Status   Specimen Description BLOOD LEFT AC  Final   Special Requests BOTTLES DRAWN AEROBIC AND ANAEROBIC  8CC  Final   Culture  Setup Time   Final    GRAM POSITIVE COCCI AEROBIC BOTTLE ONLY CRITICAL RESULT CALLED TO, READ BACK BY AND VERIFIED WITH: PAM RUMLEY @ 2132 ON 12/07/2014 BY [facility] CONFIRMED BY PAULA MARY    Culture   Final    COAGULASE NEGATIVE STAPHYLOCOCCUS AEROBIC BOTTLE ONLY Results consistent with contamination.    Report Status 12/11/2014 FINAL  Final  Culture, blood (routine x 2)     Status: None   Collection Time: 12/06/14  7:02 AM  Result Value Ref Range Status   Specimen Description BLOOD RIGHT HAND  Final   Special Requests BOTTLES  DRAWN AEROBIC AND ANAEROBIC  3CC  Final   Culture NO GROWTH 5 DAYS  Final   Report Status 12/11/2014 FINAL  Final  Urine culture     Status: None   Collection Time: 12/06/14  7:51 AM  Result Value Ref Range Status   Specimen Description URINE, RANDOM  Final   Special Requests NONE  Final   Culture   Final    >=100,000 COLONIES/mL ESCHERICHIA COLI Results Called to: AMELIA BERRY AT 1030 ON 12/08/14 CTJ    Report Status 12/08/2014 FINAL  Final   Organism ID, Bacteria ESCHERICHIA COLI  Final      Susceptibility   Escherichia coli - MIC*    AMPICILLIN >=32 RESISTANT Resistant     CEFTAZIDIME 16 RESISTANT Resistant     CEFAZOLIN >=64 RESISTANT Resistant     CEFTRIAXONE >=64 RESISTANT Resistant     GENTAMICIN <=1 SENSITIVE Sensitive     IMIPENEM <=0.25 SENSITIVE Sensitive     TRIMETH/SULFA <=20 SENSITIVE Sensitive     Extended ESBL POSITIVE Resistant     CIPROFLOXACIN Value in next row Sensitive      SENSITIVE<=0.25    NITROFURANTOIN Value in next row Resistant      RESISTANT>=512    PIP/TAZO Value in next row Sensitive      SENSITIVE<=4    * >=100,000 COLONIES/mL ESCHERICHIA COLI  MRSA PCR Screening     Status: None  Collection Time: 12/06/14 11:31 AM  Result Value Ref Range Status   MRSA by PCR NEGATIVE NEGATIVE Final    Comment:        The GeneXpert MRSA Assay (FDA approved for NASAL specimens only), is one component of a comprehensive MRSA colonization surveillance program. It is not intended to diagnose MRSA infection nor to guide or monitor treatment for MRSA infections.     Medical History: Past Medical History  Diagnosis Date  . Asthma   . A-fib (HCC)   . Arthritis   . Aortic stenosis   . Aortic insufficiency   . Hyperlipidemia   . Hypertension   . Sleep apnea   . CHF (congestive heart failure) (HCC)   . History of DVT (deep vein thrombosis)   . Diabetes mellitus type 2, uncontrolled (HCC)   . COPD (chronic obstructive pulmonary disease) (HCC)   .  Cervical spondylolysis   . DDD (degenerative disc disease), cervical   . Anemia   . Mitral valve disorder   . Anxiety   . Chronic gout   . Restless leg syndrome   . Osteoarthritis     Medications:  Anti-infectives    Start     Dose/Rate Route Frequency Ordered Stop   12/12/14 0000  ciprofloxacin (CIPRO) 500 MG tablet     500 mg Oral 2 times daily 12/12/14 0828     12/12/14 0000  doxycycline (VIBRAMYCIN) 50 MG capsule     100 mg Oral 2 times daily 12/12/14 0828 12/17/14 2359   12/11/14 1000  ciprofloxacin (CIPRO) IVPB 400 mg     400 mg 200 mL/hr over 60 Minutes Intravenous Every 12 hours 12/11/14 0733     12/09/14 2300  doxycycline (VIBRAMYCIN) 100 mg in dextrose 5 % 250 mL IVPB     100 mg 125 mL/hr over 120 Minutes Intravenous Every 12 hours 12/09/14 2218     12/09/14 2200  doxycycline (VIBRA-TABS) tablet 100 mg  Status:  Discontinued     100 mg Oral Every 12 hours 12/09/14 1643 12/09/14 2218   12/08/14 1145  ertapenem (INVANZ) 1 g in sodium chloride 0.9 % 50 mL IVPB  Status:  Discontinued     1 g 100 mL/hr over 30 Minutes Intravenous Every 24 hours 12/08/14 1141 12/11/14 0702   12/06/14 1700  vancomycin (VANCOCIN) 1,250 mg in sodium chloride 0.9 % 250 mL IVPB  Status:  Discontinued     1,250 mg 166.7 mL/hr over 90 Minutes Intravenous Every 24 hours 12/06/14 0844 12/08/14 1141   12/06/14 1400  piperacillin-tazobactam (ZOSYN) IVPB 3.375 g  Status:  Discontinued     3.375 g 12.5 mL/hr over 240 Minutes Intravenous 3 times per day 12/06/14 0844 12/08/14 1141   12/06/14 1000  vancomycin (VANCOCIN) IVPB 1000 mg/200 mL premix  Status:  Discontinued     1,000 mg 200 mL/hr over 60 Minutes Intravenous Every 24 hours 12/06/14 0948 12/08/14 1141   12/06/14 0715  piperacillin-tazobactam (ZOSYN) IVPB 3.375 g     3.375 g 100 mL/hr over 30 Minutes Intravenous  Once 12/06/14 0714 12/06/14 0830   12/06/14 0715  vancomycin (VANCOCIN) IVPB 1000 mg/200 mL premix     1,000 mg 200 mL/hr over 60  Minutes Intravenous  Once 12/06/14 [id number] 12/06/14 0945     Assessment: Pharmacy consulted to dose ciprofloxacin in this 81 year old female who is being treated for an ESBL-positive E. Coli UTI which is sensitive to ciprofloxacin. This is currently day #8 of appropriate antibiotics. Patient  was previously on both piperacillin/tazobactam and ertapenem, both of which were sensitive to the E.coli.   Plan:  Will continue ciprofloxacin 400 mg IV every 12 hours based on renal function and indication. Pharmacy will continue to monitor, thank you for the consult!  Luisa Hart D 12/13/2014,12:21 PM

## 2014-12-14 LAB — GLUCOSE, CAPILLARY
GLUCOSE-CAPILLARY: 176 mg/dL — AB (ref 65–99)
GLUCOSE-CAPILLARY: 168 mg/dL — AB (ref 65–99)

## 2014-12-15 LAB — GLUCOSE, CAPILLARY
GLUCOSE-CAPILLARY: 131 mg/dL — AB (ref 65–99)
GLUCOSE-CAPILLARY: 166 mg/dL — AB (ref 65–99)
Glucose-Capillary: 165 mg/dL — ABNORMAL HIGH (ref 65–99)
Glucose-Capillary: 198 mg/dL — ABNORMAL HIGH (ref 65–99)

## 2014-12-16 LAB — GLUCOSE, CAPILLARY
GLUCOSE-CAPILLARY: 108 mg/dL — AB (ref 65–99)
GLUCOSE-CAPILLARY: 169 mg/dL — AB (ref 65–99)
GLUCOSE-CAPILLARY: 186 mg/dL — AB (ref 65–99)
Glucose-Capillary: 219 mg/dL — ABNORMAL HIGH (ref 65–99)
Glucose-Capillary: 118 mg/dL — ABNORMAL HIGH (ref 65–99)
Glucose-Capillary: 149 mg/dL — ABNORMAL HIGH (ref 65–99)

## 2014-12-17 LAB — URINALYSIS COMPLETE WITH MICROSCOPIC (ARMC ONLY)
BACTERIA UA: NONE SEEN
Bilirubin Urine: NEGATIVE
GLUCOSE, UA: NEGATIVE mg/dL
HGB URINE DIPSTICK: NEGATIVE
KETONES UR: NEGATIVE mg/dL
LEUKOCYTES UA: NEGATIVE
NITRITE: NEGATIVE
PROTEIN: NEGATIVE mg/dL
RBC / HPF: NONE SEEN RBC/hpf (ref 0–5)
SPECIFIC GRAVITY, URINE: 1.014 (ref 1.005–1.030)
SQUAMOUS EPITHELIAL / LPF: NONE SEEN
pH: 6 (ref 5.0–8.0)

## 2014-12-17 LAB — GLUCOSE, CAPILLARY
Glucose-Capillary: 133 mg/dL — ABNORMAL HIGH (ref 65–99)
Glucose-Capillary: 159 mg/dL — ABNORMAL HIGH (ref 65–99)
Glucose-Capillary: 197 mg/dL — ABNORMAL HIGH (ref 65–99)
Glucose-Capillary: 176 mg/dL — ABNORMAL HIGH (ref 65–99)

## 2014-12-18 DIAGNOSIS — E119 Type 2 diabetes mellitus without complications: Secondary | ICD-10-CM | POA: Diagnosis present

## 2014-12-18 LAB — GLUCOSE, CAPILLARY
GLUCOSE-CAPILLARY: 192 mg/dL — AB (ref 65–99)
Glucose-Capillary: 170 mg/dL — ABNORMAL HIGH (ref 65–99)
Glucose-Capillary: 144 mg/dL — ABNORMAL HIGH (ref 65–99)
Glucose-Capillary: 185 mg/dL — ABNORMAL HIGH (ref 65–99)
Glucose-Capillary: 215 mg/dL — ABNORMAL HIGH (ref 65–99)

## 2014-12-19 DIAGNOSIS — E119 Type 2 diabetes mellitus without complications: Secondary | ICD-10-CM | POA: Diagnosis not present

## 2014-12-19 LAB — GLUCOSE, CAPILLARY
GLUCOSE-CAPILLARY: 171 mg/dL — AB (ref 65–99)
Glucose-Capillary: 112 mg/dL — ABNORMAL HIGH (ref 65–99)
Glucose-Capillary: 140 mg/dL — ABNORMAL HIGH (ref 65–99)
Glucose-Capillary: 250 mg/dL — ABNORMAL HIGH (ref 65–99)

## 2014-12-19 LAB — URINE CULTURE: Culture: NO GROWTH

## 2014-12-20 DIAGNOSIS — E119 Type 2 diabetes mellitus without complications: Secondary | ICD-10-CM | POA: Diagnosis not present

## 2014-12-20 LAB — GLUCOSE, CAPILLARY
GLUCOSE-CAPILLARY: 203 mg/dL — AB (ref 65–99)
Glucose-Capillary: 90 mg/dL (ref 65–99)
Glucose-Capillary: 121 mg/dL — ABNORMAL HIGH (ref 65–99)
Glucose-Capillary: 112 mg/dL — ABNORMAL HIGH (ref 65–99)

## 2014-12-21 DIAGNOSIS — E119 Type 2 diabetes mellitus without complications: Secondary | ICD-10-CM | POA: Diagnosis not present

## 2014-12-21 LAB — GLUCOSE, CAPILLARY
GLUCOSE-CAPILLARY: 213 mg/dL — AB (ref 65–99)
GLUCOSE-CAPILLARY: 236 mg/dL — AB (ref 65–99)
Glucose-Capillary: 109 mg/dL — ABNORMAL HIGH (ref 65–99)
Glucose-Capillary: 185 mg/dL — ABNORMAL HIGH (ref 65–99)

## 2014-12-22 DIAGNOSIS — E119 Type 2 diabetes mellitus without complications: Secondary | ICD-10-CM | POA: Diagnosis not present

## 2014-12-22 LAB — GLUCOSE, CAPILLARY
GLUCOSE-CAPILLARY: 173 mg/dL — AB (ref 65–99)
GLUCOSE-CAPILLARY: 128 mg/dL — AB (ref 65–99)
Glucose-Capillary: 195 mg/dL — ABNORMAL HIGH (ref 65–99)
Glucose-Capillary: 217 mg/dL — ABNORMAL HIGH (ref 65–99)

## 2014-12-23 DIAGNOSIS — E119 Type 2 diabetes mellitus without complications: Secondary | ICD-10-CM | POA: Diagnosis not present

## 2014-12-23 LAB — GLUCOSE, CAPILLARY
GLUCOSE-CAPILLARY: 224 mg/dL — AB (ref 65–99)
GLUCOSE-CAPILLARY: 164 mg/dL — AB (ref 65–99)
GLUCOSE-CAPILLARY: 168 mg/dL — AB (ref 65–99)
GLUCOSE-CAPILLARY: 219 mg/dL — AB (ref 65–99)

## 2014-12-24 LAB — GLUCOSE, CAPILLARY
GLUCOSE-CAPILLARY: 213 mg/dL — AB (ref 65–99)
GLUCOSE-CAPILLARY: 181 mg/dL — AB (ref 65–99)
GLUCOSE-CAPILLARY: 213 mg/dL — AB (ref 65–99)
Glucose-Capillary: 178 mg/dL — ABNORMAL HIGH (ref 65–99)
Glucose-Capillary: 194 mg/dL — ABNORMAL HIGH (ref 65–99)

## 2014-12-25 DIAGNOSIS — E119 Type 2 diabetes mellitus without complications: Secondary | ICD-10-CM | POA: Diagnosis not present

## 2014-12-25 LAB — GLUCOSE, CAPILLARY
GLUCOSE-CAPILLARY: 182 mg/dL — AB (ref 65–99)
Glucose-Capillary: 185 mg/dL — ABNORMAL HIGH (ref 65–99)
Glucose-Capillary: 244 mg/dL — ABNORMAL HIGH (ref 65–99)
Glucose-Capillary: 248 mg/dL — ABNORMAL HIGH (ref 65–99)

## 2014-12-26 DIAGNOSIS — E119 Type 2 diabetes mellitus without complications: Secondary | ICD-10-CM | POA: Diagnosis not present

## 2014-12-26 LAB — GLUCOSE, CAPILLARY
GLUCOSE-CAPILLARY: 212 mg/dL — AB (ref 65–99)
GLUCOSE-CAPILLARY: 202 mg/dL — AB (ref 65–99)
GLUCOSE-CAPILLARY: 257 mg/dL — AB (ref 65–99)
Glucose-Capillary: 139 mg/dL — ABNORMAL HIGH (ref 65–99)

## 2014-12-27 DIAGNOSIS — E119 Type 2 diabetes mellitus without complications: Secondary | ICD-10-CM | POA: Diagnosis not present

## 2014-12-27 LAB — GLUCOSE, CAPILLARY
GLUCOSE-CAPILLARY: 150 mg/dL — AB (ref 65–99)
GLUCOSE-CAPILLARY: 177 mg/dL — AB (ref 65–99)
GLUCOSE-CAPILLARY: 221 mg/dL — AB (ref 65–99)
Glucose-Capillary: 224 mg/dL — ABNORMAL HIGH (ref 65–99)

## 2014-12-28 DIAGNOSIS — E119 Type 2 diabetes mellitus without complications: Secondary | ICD-10-CM | POA: Diagnosis not present

## 2014-12-28 LAB — GLUCOSE, CAPILLARY
GLUCOSE-CAPILLARY: 150 mg/dL — AB (ref 65–99)
GLUCOSE-CAPILLARY: 212 mg/dL — AB (ref 65–99)
GLUCOSE-CAPILLARY: 284 mg/dL — AB (ref 65–99)
Glucose-Capillary: 245 mg/dL — ABNORMAL HIGH (ref 65–99)

## 2014-12-29 DIAGNOSIS — E119 Type 2 diabetes mellitus without complications: Secondary | ICD-10-CM | POA: Diagnosis not present

## 2014-12-29 LAB — GLUCOSE, CAPILLARY
GLUCOSE-CAPILLARY: 144 mg/dL — AB (ref 65–99)
Glucose-Capillary: 226 mg/dL — ABNORMAL HIGH (ref 65–99)
Glucose-Capillary: 224 mg/dL — ABNORMAL HIGH (ref 65–99)
Glucose-Capillary: 263 mg/dL — ABNORMAL HIGH (ref 65–99)

## 2014-12-30 DIAGNOSIS — E119 Type 2 diabetes mellitus without complications: Secondary | ICD-10-CM | POA: Diagnosis not present

## 2014-12-30 LAB — GLUCOSE, CAPILLARY
GLUCOSE-CAPILLARY: 161 mg/dL — AB (ref 65–99)
Glucose-Capillary: 197 mg/dL — ABNORMAL HIGH (ref 65–99)
Glucose-Capillary: 229 mg/dL — ABNORMAL HIGH (ref 65–99)
Glucose-Capillary: 231 mg/dL — ABNORMAL HIGH (ref 65–99)

## 2014-12-31 DIAGNOSIS — E119 Type 2 diabetes mellitus without complications: Secondary | ICD-10-CM | POA: Diagnosis not present

## 2014-12-31 LAB — GLUCOSE, CAPILLARY
GLUCOSE-CAPILLARY: 209 mg/dL — AB (ref 65–99)
GLUCOSE-CAPILLARY: 298 mg/dL — AB (ref 65–99)
GLUCOSE-CAPILLARY: 409 mg/dL — AB (ref 65–99)
GLUCOSE-CAPILLARY: 363 mg/dL — AB (ref 65–99)

## 2015-01-01 DIAGNOSIS — E119 Type 2 diabetes mellitus without complications: Secondary | ICD-10-CM | POA: Diagnosis not present

## 2015-01-01 LAB — GLUCOSE, CAPILLARY
GLUCOSE-CAPILLARY: 202 mg/dL — AB (ref 65–99)
GLUCOSE-CAPILLARY: 192 mg/dL — AB (ref 65–99)
Glucose-Capillary: 343 mg/dL — ABNORMAL HIGH (ref 65–99)

## 2015-01-02 DIAGNOSIS — E119 Type 2 diabetes mellitus without complications: Secondary | ICD-10-CM | POA: Diagnosis not present

## 2015-01-02 LAB — GLUCOSE, CAPILLARY
GLUCOSE-CAPILLARY: 381 mg/dL — AB (ref 65–99)
GLUCOSE-CAPILLARY: 299 mg/dL — AB (ref 65–99)
GLUCOSE-CAPILLARY: 379 mg/dL — AB (ref 65–99)
Glucose-Capillary: 217 mg/dL — ABNORMAL HIGH (ref 65–99)
Glucose-Capillary: 252 mg/dL — ABNORMAL HIGH (ref 65–99)

## 2015-01-03 DIAGNOSIS — E119 Type 2 diabetes mellitus without complications: Secondary | ICD-10-CM | POA: Diagnosis not present

## 2015-01-03 LAB — GLUCOSE, CAPILLARY
GLUCOSE-CAPILLARY: 90 mg/dL (ref 65–99)
GLUCOSE-CAPILLARY: 393 mg/dL — AB (ref 65–99)
GLUCOSE-CAPILLARY: 420 mg/dL — AB (ref 65–99)
GLUCOSE-CAPILLARY: 347 mg/dL — AB (ref 65–99)
Glucose-Capillary: 222 mg/dL — ABNORMAL HIGH (ref 65–99)

## 2015-01-04 DIAGNOSIS — E119 Type 2 diabetes mellitus without complications: Secondary | ICD-10-CM | POA: Diagnosis not present

## 2015-01-04 LAB — GLUCOSE, CAPILLARY
GLUCOSE-CAPILLARY: 105 mg/dL — AB (ref 65–99)
GLUCOSE-CAPILLARY: 474 mg/dL — AB (ref 65–99)
GLUCOSE-CAPILLARY: 501 mg/dL — AB (ref 65–99)
Glucose-Capillary: 309 mg/dL — ABNORMAL HIGH (ref 65–99)
Glucose-Capillary: 411 mg/dL — ABNORMAL HIGH (ref 65–99)
Glucose-Capillary: 388 mg/dL — ABNORMAL HIGH (ref 65–99)

## 2015-01-05 DIAGNOSIS — E119 Type 2 diabetes mellitus without complications: Secondary | ICD-10-CM | POA: Diagnosis not present

## 2015-01-05 LAB — GLUCOSE, CAPILLARY
GLUCOSE-CAPILLARY: 308 mg/dL — AB (ref 65–99)
Glucose-Capillary: 96 mg/dL (ref 65–99)
Glucose-Capillary: 193 mg/dL — ABNORMAL HIGH (ref 65–99)
Glucose-Capillary: 397 mg/dL — ABNORMAL HIGH (ref 65–99)

## 2015-01-06 DIAGNOSIS — E119 Type 2 diabetes mellitus without complications: Secondary | ICD-10-CM | POA: Diagnosis not present

## 2015-01-06 LAB — GLUCOSE, CAPILLARY
GLUCOSE-CAPILLARY: 428 mg/dL — AB (ref 65–99)
GLUCOSE-CAPILLARY: 319 mg/dL — AB (ref 65–99)
Glucose-Capillary: 129 mg/dL — ABNORMAL HIGH (ref 65–99)
Glucose-Capillary: 310 mg/dL — ABNORMAL HIGH (ref 65–99)

## 2015-01-07 DIAGNOSIS — E119 Type 2 diabetes mellitus without complications: Secondary | ICD-10-CM | POA: Diagnosis not present

## 2015-01-07 LAB — GLUCOSE, CAPILLARY
GLUCOSE-CAPILLARY: 152 mg/dL — AB (ref 65–99)
GLUCOSE-CAPILLARY: 252 mg/dL — AB (ref 65–99)
Glucose-Capillary: 244 mg/dL — ABNORMAL HIGH (ref 65–99)
Glucose-Capillary: 346 mg/dL — ABNORMAL HIGH (ref 65–99)

## 2015-01-08 DIAGNOSIS — E119 Type 2 diabetes mellitus without complications: Secondary | ICD-10-CM | POA: Diagnosis not present

## 2015-01-08 LAB — GLUCOSE, CAPILLARY
GLUCOSE-CAPILLARY: 302 mg/dL — AB (ref 65–99)
Glucose-Capillary: 113 mg/dL — ABNORMAL HIGH (ref 65–99)
Glucose-Capillary: 240 mg/dL — ABNORMAL HIGH (ref 65–99)
Glucose-Capillary: 296 mg/dL — ABNORMAL HIGH (ref 65–99)

## 2015-01-09 DIAGNOSIS — E119 Type 2 diabetes mellitus without complications: Secondary | ICD-10-CM | POA: Diagnosis not present

## 2015-01-09 LAB — GLUCOSE, CAPILLARY
GLUCOSE-CAPILLARY: 231 mg/dL — AB (ref 65–99)
Glucose-Capillary: 138 mg/dL — ABNORMAL HIGH (ref 65–99)
Glucose-Capillary: 188 mg/dL — ABNORMAL HIGH (ref 65–99)
Glucose-Capillary: 221 mg/dL — ABNORMAL HIGH (ref 65–99)

## 2015-01-10 DIAGNOSIS — E119 Type 2 diabetes mellitus without complications: Secondary | ICD-10-CM | POA: Diagnosis not present

## 2015-01-10 LAB — GLUCOSE, CAPILLARY
GLUCOSE-CAPILLARY: 188 mg/dL — AB (ref 65–99)
GLUCOSE-CAPILLARY: 165 mg/dL — AB (ref 65–99)
GLUCOSE-CAPILLARY: 225 mg/dL — AB (ref 65–99)
GLUCOSE-CAPILLARY: 236 mg/dL — AB (ref 65–99)

## 2015-01-11 ENCOUNTER — Encounter
Admission: RE | Admit: 2015-01-11 | Discharge: 2015-01-11 | Disposition: A | Discharge status: Home / Self Care | Source: Ambulatory Visit | Attending: Internal Medicine | Admitting: Internal Medicine

## 2015-01-11 DIAGNOSIS — E119 Type 2 diabetes mellitus without complications: Secondary | ICD-10-CM | POA: Insufficient documentation

## 2015-01-11 LAB — GLUCOSE, CAPILLARY
Glucose-Capillary: 244 mg/dL — ABNORMAL HIGH (ref 65–99)
Glucose-Capillary: 314 mg/dL — ABNORMAL HIGH (ref 65–99)
Glucose-Capillary: 318 mg/dL — ABNORMAL HIGH (ref 65–99)

## 2015-02-11 DEATH — deceased

## 2016-03-02 IMAGING — CR DG CHEST 1V PORT
1 series · 1 of 1 positions shown · non-contrast
Comparison: 09/01/2014

CLINICAL DATA: Cough and difficulty breathing tonight.

EXAM:
PORTABLE CHEST - 1 VIEW

[portable]
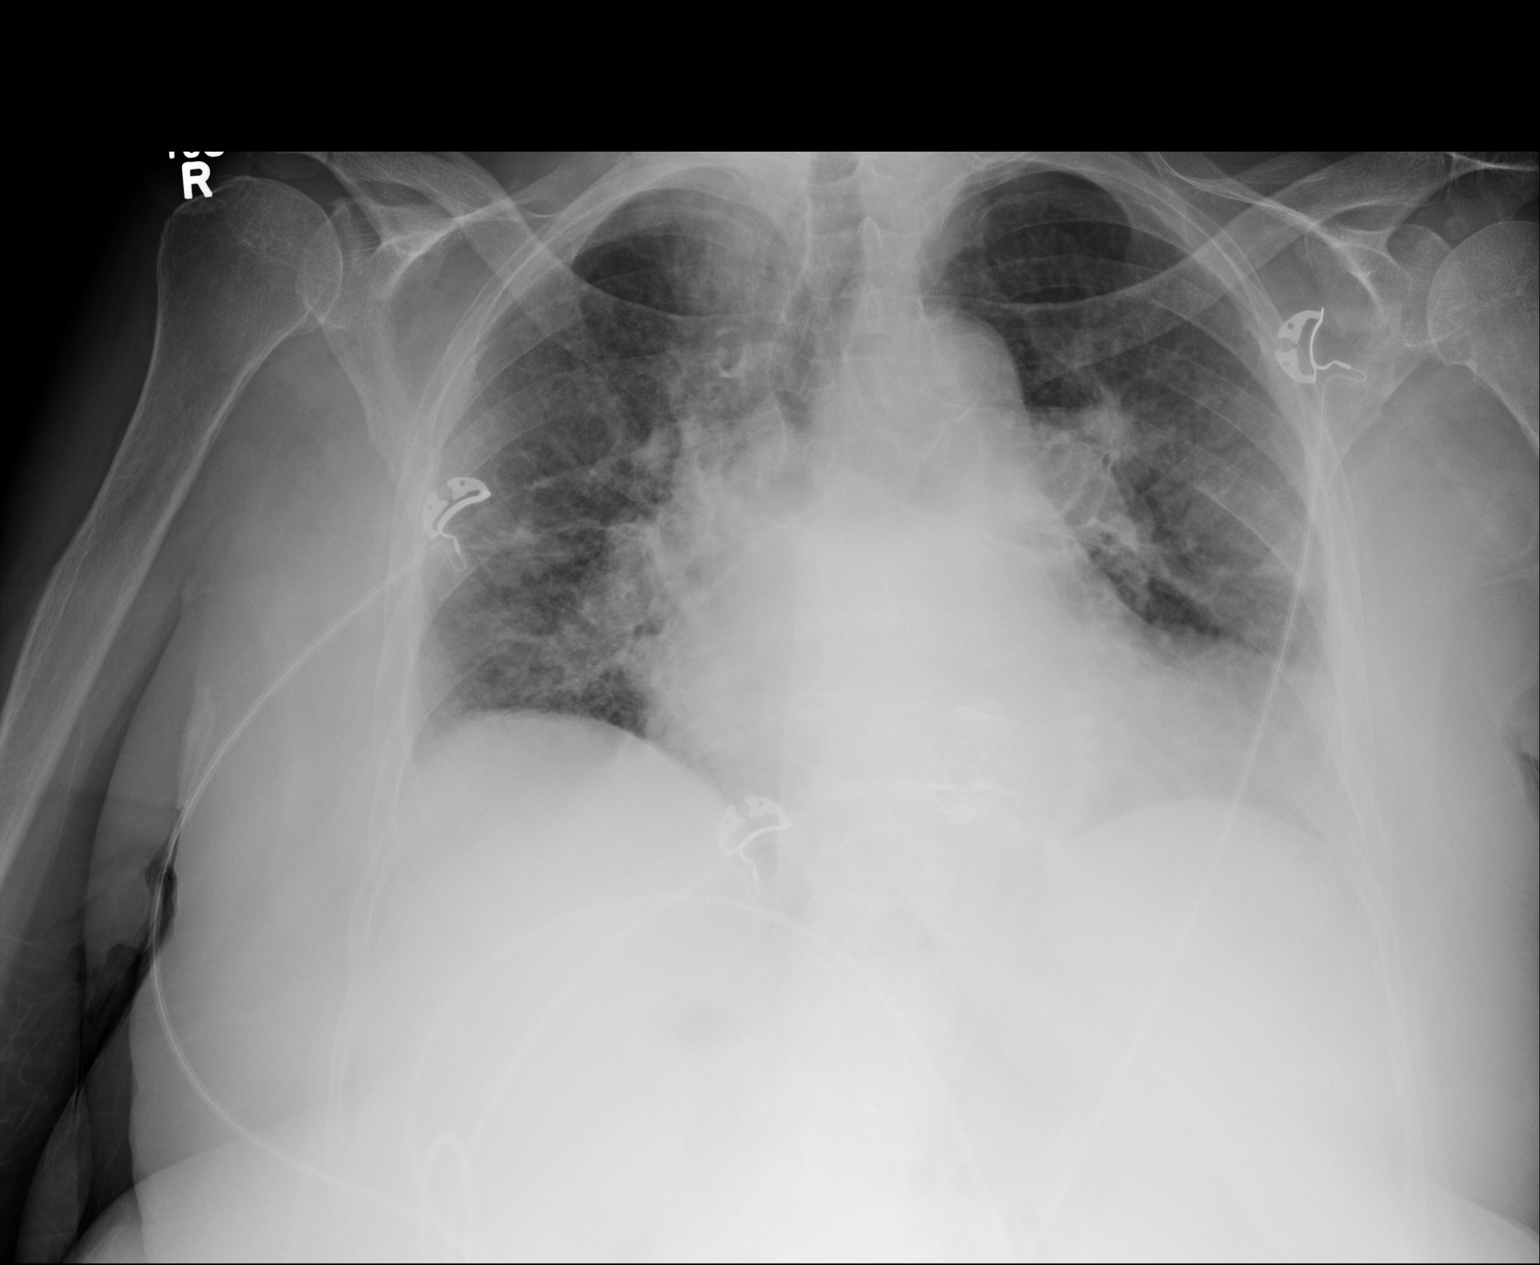

[1 of 1 positions shown; findings below may reference images not displayed]

FINDINGS: Shallow inspiration. Cardiac enlargement. Mild vascular congestion.
Perihilar infiltrates suggesting edema. No blunting of costophrenic
angles. No pneumothorax. Calcification of the aorta. Postsurgical
changes in the heart.
IMPRESSION: Cardiac enlargement with mild vascular congestion and suggestion of
perihilar edema.

## 2016-05-23 IMAGING — CT CT HEAD W/O CM
1 of 2 series · 13 of 30 positions shown, 17 images · non-contrast
Comparison: Head CT scan 08/28/2014.

CLINICAL DATA: Altered mental status today. No known injury.
Initial encounter.

EXAM:
CT HEAD WITHOUT CONTRAST
TECHNIQUE: Contiguous axial images were obtained from the base of the skull
through the vertex without intravenous contrast.

[Series 2: head wo · axial · 0.42mm/px · z∈[+266,+401]mm · 13 of 33 slices shown, 17 images]
[im 3/33  brain]
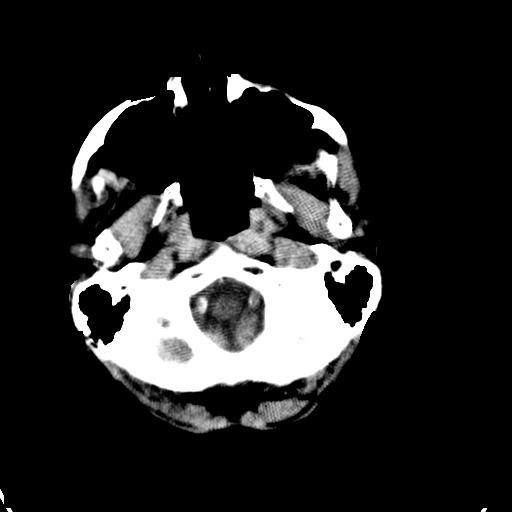
[im 3/33  bone]
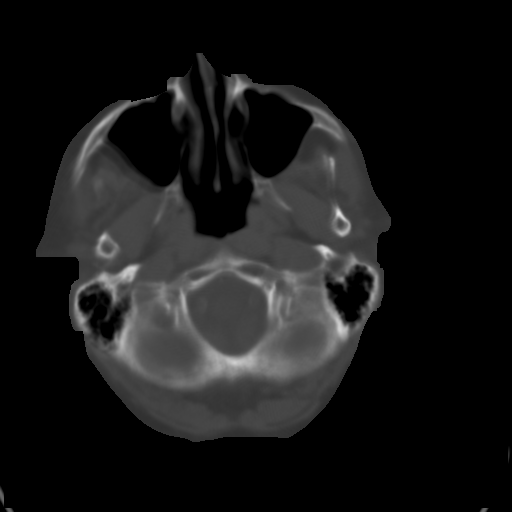
[im 5/33  brain]
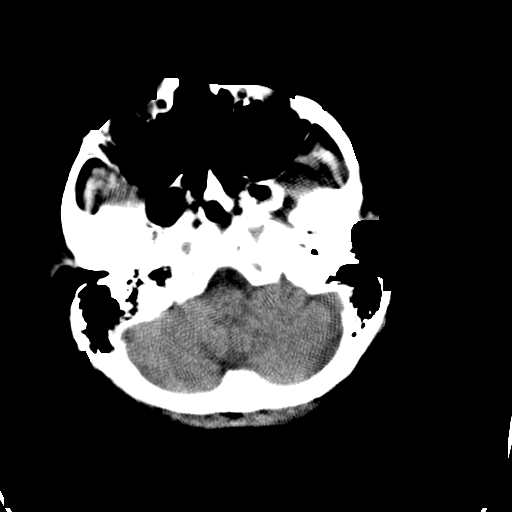
[im 7/33  brain]
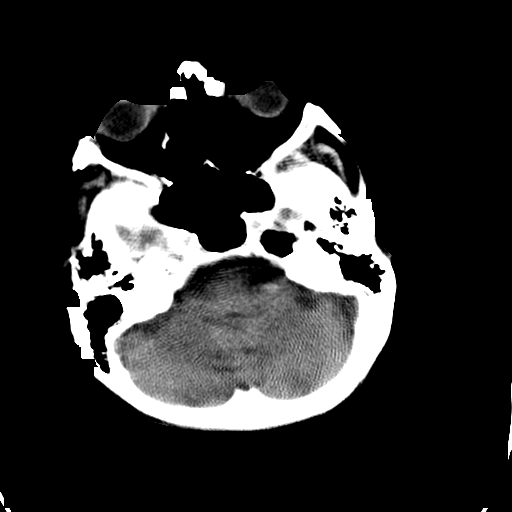
[im 10/33  brain]
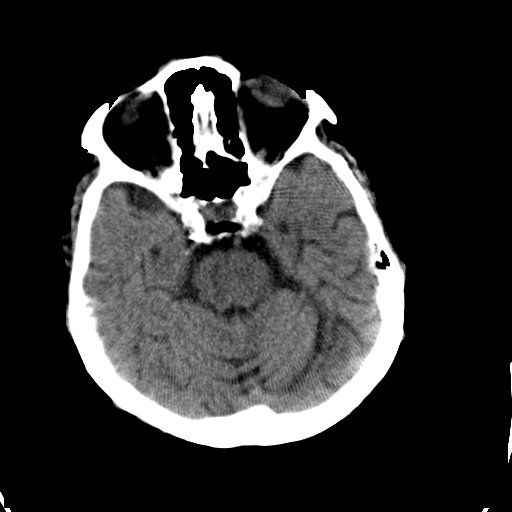
[im 12/33  brain]
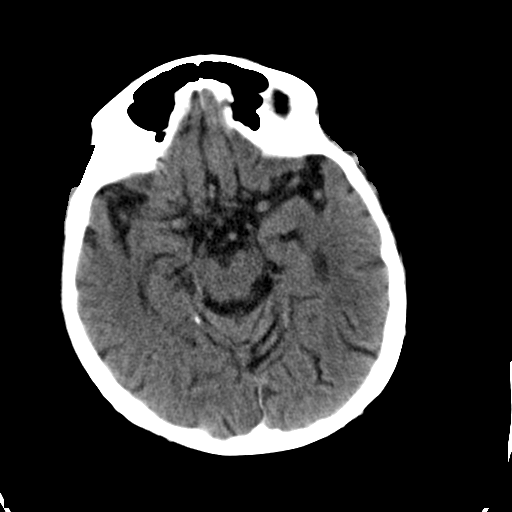
[im 12/33  bone]
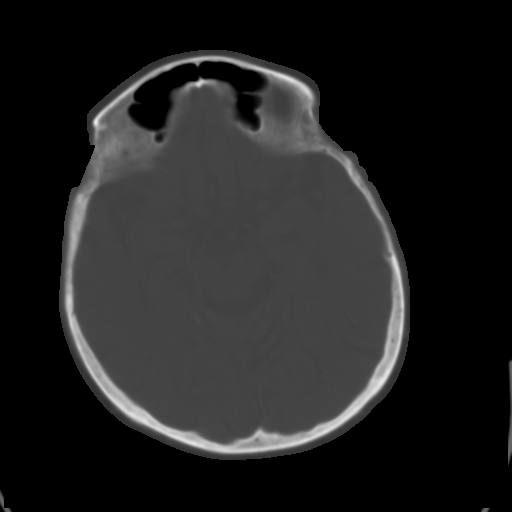
[im 14/33  brain]
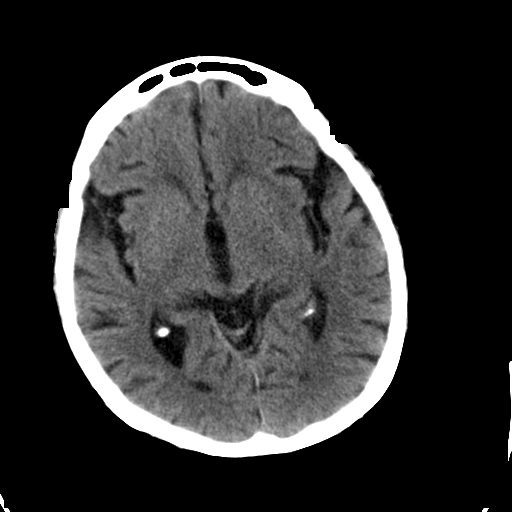
[im 17/33  brain]
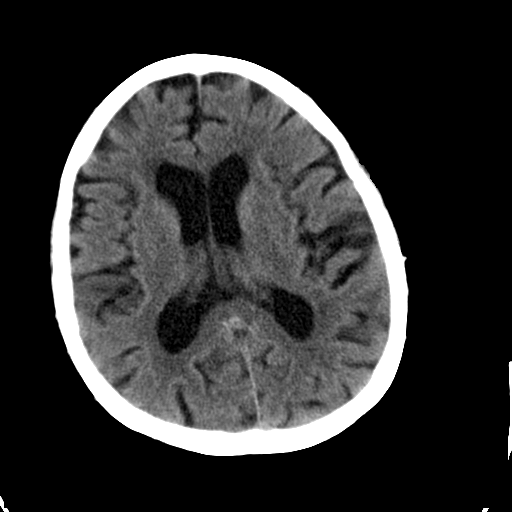
[im 19/33  brain]
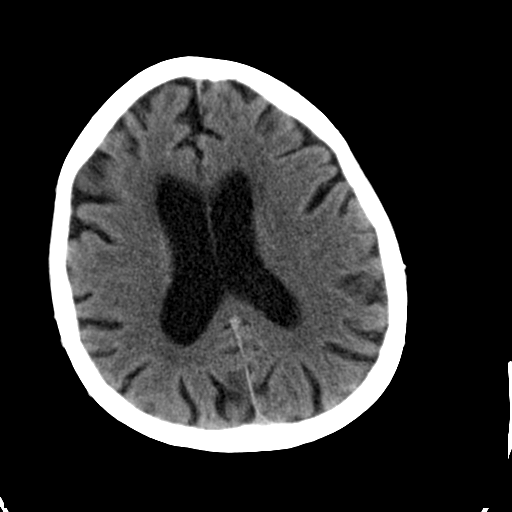
[im 21/33  brain]
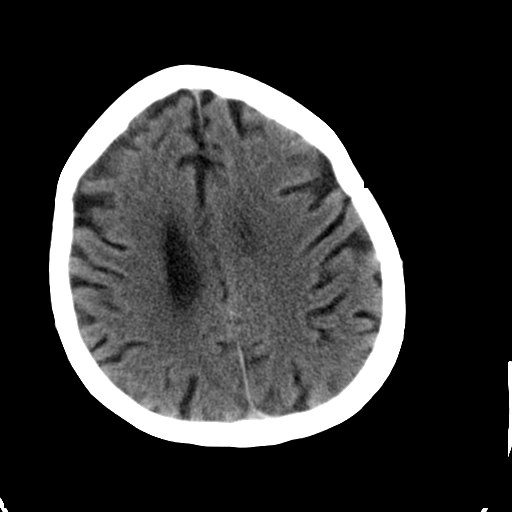
[im 21/33  bone]
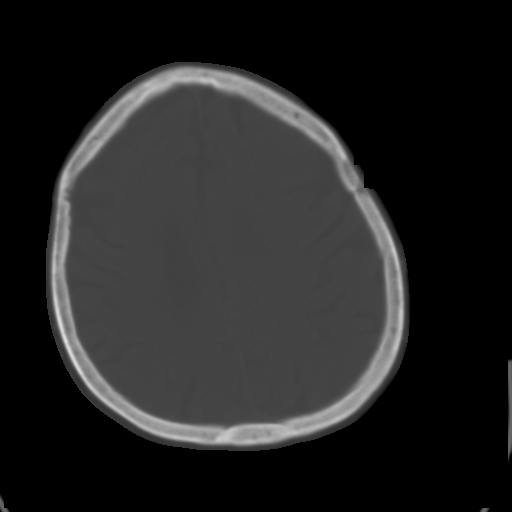
[im 23/33  brain]
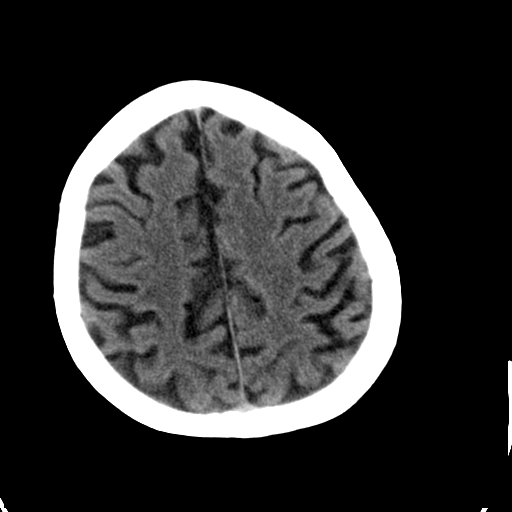
[im 26/33  brain]
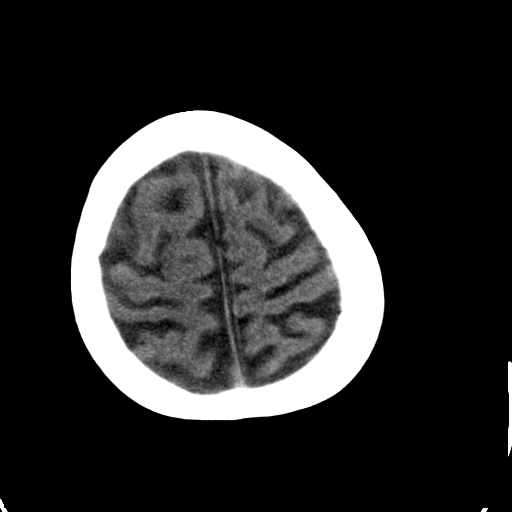
[im 28/33  brain]
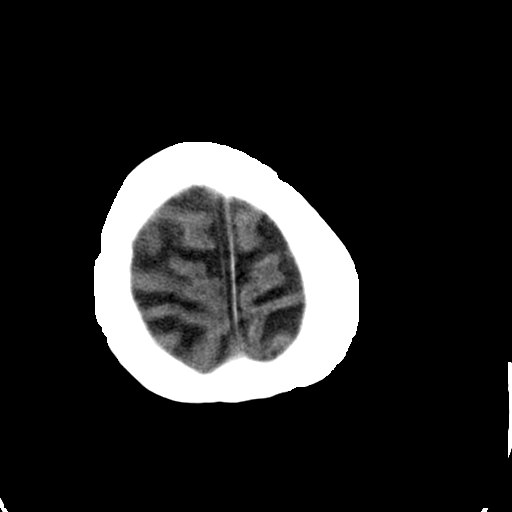
[im 30/33  brain]
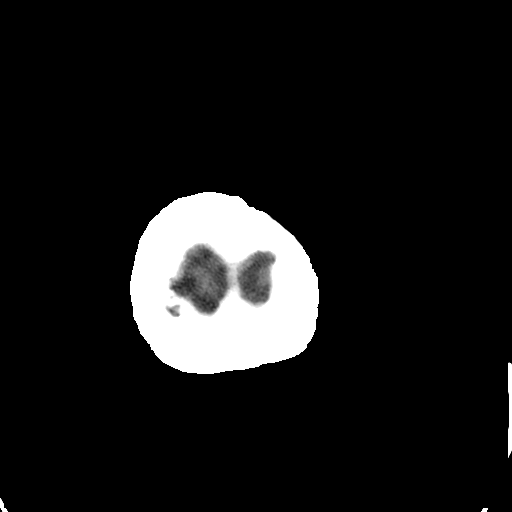
[im 30/33  bone]
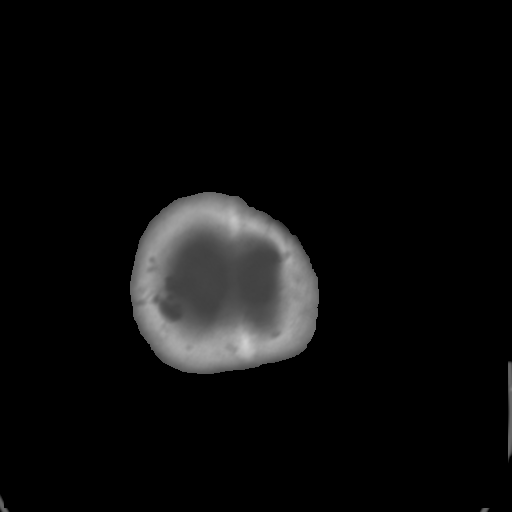

[13 of 30 positions shown; findings below may reference images not displayed]

FINDINGS: Cortical atrophy is again seen. There is no evidence of acute
intracranial abnormality including hemorrhage, infarct, mass lesion,
mass effect, midline shift or abnormal extra-axial fluid collection.
No hydrocephalus or pneumocephalus. The calvarium is intact. Carotid
atherosclerosis is noted. Imaged paranasal sinuses and mastoid air
cells are clear.
IMPRESSION: No acute abnormality.

Atrophy.

Atherosclerosis.
